# Patient Record
Sex: Male | Born: 1945 | Race: White | Hispanic: No | Marital: Married | Smoking: Former smoker
Health system: Southern US, Community
[De-identification: ages and names within clinical notes are randomized; demographics above are authoritative.]

## PROBLEM LIST (undated history)

## (undated) DIAGNOSIS — Z8679 Personal history of other diseases of the circulatory system: Secondary | ICD-10-CM

## (undated) DIAGNOSIS — I639 Cerebral infarction, unspecified: Secondary | ICD-10-CM

## (undated) DIAGNOSIS — Z87898 Personal history of other specified conditions: Secondary | ICD-10-CM

## (undated) DIAGNOSIS — I4891 Unspecified atrial fibrillation: Secondary | ICD-10-CM

## (undated) HISTORY — DX: Personal history of other specified conditions: Z87.898

## (undated) HISTORY — DX: Personal history of other diseases of the circulatory system: Z86.79

## (undated) HISTORY — DX: Cerebral infarction, unspecified: I63.9

---

## 1997-09-16 ENCOUNTER — Emergency Department (HOSPITAL_COMMUNITY): Admission: EM | Admit: 1997-09-16 | Discharge: 1997-09-16 | Payer: Self-pay | Admitting: Emergency Medicine

## 2006-09-20 ENCOUNTER — Emergency Department (HOSPITAL_COMMUNITY): Admission: EM | Admit: 2006-09-20 | Discharge: 2006-09-20 | Payer: Self-pay | Admitting: Emergency Medicine

## 2006-09-27 ENCOUNTER — Emergency Department (HOSPITAL_COMMUNITY): Admission: EM | Admit: 2006-09-27 | Discharge: 2006-09-27 | Payer: Self-pay | Admitting: Emergency Medicine

## 2006-10-03 ENCOUNTER — Emergency Department (HOSPITAL_COMMUNITY): Admission: EM | Admit: 2006-10-03 | Discharge: 2006-10-03 | Payer: Self-pay | Admitting: Emergency Medicine

## 2010-01-10 ENCOUNTER — Observation Stay (HOSPITAL_COMMUNITY)
Admission: EM | Admit: 2010-01-10 | Discharge: 2010-01-11 | Payer: Self-pay | Source: Home / Self Care | Admitting: Emergency Medicine

## 2010-01-11 ENCOUNTER — Ambulatory Visit: Payer: Self-pay | Admitting: Gastroenterology

## 2010-01-13 ENCOUNTER — Encounter: Payer: Self-pay | Admitting: Gastroenterology

## 2010-01-13 ENCOUNTER — Telehealth (INDEPENDENT_AMBULATORY_CARE_PROVIDER_SITE_OTHER): Payer: Self-pay

## 2010-01-20 ENCOUNTER — Telehealth (INDEPENDENT_AMBULATORY_CARE_PROVIDER_SITE_OTHER): Payer: Self-pay

## 2010-01-20 ENCOUNTER — Encounter: Payer: Self-pay | Admitting: Gastroenterology

## 2010-01-29 ENCOUNTER — Encounter: Payer: Self-pay | Admitting: Gastroenterology

## 2010-02-23 ENCOUNTER — Encounter (INDEPENDENT_AMBULATORY_CARE_PROVIDER_SITE_OTHER): Payer: Self-pay | Admitting: *Deleted

## 2010-06-09 NOTE — Progress Notes (Signed)
Summary: Schedule repeat EGD Dil  Phone Note Outgoing Call Call back at [facility] Phone [phone]   Call placed by: Darcey Nora RN, CGRN,  January 20, 2010 8:55 AM Call placed to: Patient Summary of Call: I called and spoke with the patient, according to Dr Gretta Began office he is low risk and ok for ESA.  Patient  wants to wait until his Medicare takes effect on 02/07/10, and needs a Monday.  Patient  is scheduled for 03/02/10 3:00, pre-visit is scheduled for 02/23/10 9:00.  Initial call taken by: Darcey Nora RN, CGRN,  January 20, 2010 8:56 AM     Appended Document: Schedule repeat EGD Dil I agree with above plan.

## 2010-06-09 NOTE — Letter (Signed)
Summary: [facility]  [facility]   Imported By: Lester Solis 02/10/2010 09:32:13  _____________________________________________________________________  External Attachment:    Type:   Image     Comment:   External Document

## 2010-06-09 NOTE — Letter (Signed)
Summary: Pre Visit No Show Letter  [facility]  [address] [place], [place] [address]   Phone: [phone]  Fax: [phone]        February 23, 2010 MRN: [id number]    COLBE VIVIANO [address] [place], [place]  [address]    Dear Mr. Sher,   We have been unable to reach you by phone concerning the pre-procedure visit that you missed on Monday Oct. 17th, 2011. For this reason,your procedure scheduled on Monday Oct. 24th, 2011 @ 3 pm has been cancelled. Our scheduling staff will gladly assist you with rescheduling your appointments at a more convenient time. Please call our office at [phone] between the hours of 8:00am and 5:00pm, press option #2 to reach an appointment scheduler. Please consider updating your contact numbers at this time so that we can reach you by phone in the future with schedule changes or results.    Thank you,    Doristine Church RN II [facility]

## 2010-06-09 NOTE — Progress Notes (Signed)
Summary: Schedule repeat EGD  Phone Note Outgoing Call Call back at Home Phone    Call placed by: Darcey Nora RN, CGRN,  January 13, 2010 12:00 PM Call placed to: Patient Summary of Call: I have spoke with the patient's sone.  He is aware we are waiting to hear back from Dr Garen Lah about clearance before setting up repeat EGD. They are aware, I will be calling back once I hear for cardiology Initial call taken by: Darcey Nora RN, CGRN,  January 13, 2010 12:02 PM

## 2010-06-09 NOTE — Letter (Signed)
Summary: Anticoagulation Modification Letter  [facility]  [address] [place], [place] [address]   Phone: [phone]  Fax: [phone]    January 13, 2010  Re:    Phillip Solis DOB:    1945/07/30 MRN:    [id number]    Dear  Dr Ranell Patrick,   Dr Lamont Snowball has determined the above patient needs a repeat EGD with dilation in 2-4 weeks to prevent any further food impactions.  He was recently hospitalized and treated by you during the admission for atrial fibrillation.   Please advise if ok to proceed with the procedure in the next 2-4 weeks.     Please fax you recommendations back to Darcey Nora, RN.  Thank you for your help with this matter.  Sincerely,  Darcey Nora RN, [facility]   Physician Recommendation:  ____________________________________________________________________________________________________________________________________________.

## 2010-06-09 NOTE — Letter (Signed)
Summary: Previsit letter  [facility]  [address] [place], [place] [address]   Phone: [phone]  Fax: [phone]       01/20/2010 MRN: [id number]  Erric Grantham [address] [place], [place]  [address]  [place]  Dear Mr. Stoneham,  Welcome to the Gastroenterology Division at [facility].    You are scheduled to see a nurse for your pre-procedure visit on 02/23/10  at 9:00 a.m. on the 3rd floor at [facility], [address]  We ask that you try to arrive at our office 15 minutes prior to your appointment time to allow for check-in.  Your nurse visit will consist of discussing your medical and surgical history, your immediate family medical history, and your medications.    Please bring a complete list of all your medications or, if you prefer, bring the medication bottles and we will list them.  We will need to be aware of both prescribed and over the counter drugs.  We will need to know exact dosage information as well.  If you are on blood thinners (Coumadin, Plavix, Aggrenox, Ticlid, etc.) please call our office today/prior to your appointment, as we need to consult with your physician about holding your medication.   Please be prepared to read and sign documents such as consent forms, a financial agreement, and acknowledgement forms.  If necessary, and with your consent, a friend or relative is welcome to sit-in on the nurse visit with you.  Please bring your insurance card so that we may make a copy of it.  If your insurance requires a referral to see a specialist, please bring your referral form from your primary care physician.  No co-pay is required for this nurse visit.     If you cannot keep your appointment, please call [phone] to cancel or reschedule prior to your appointment date.  This allows [place] the opportunity to schedule an appointment for another patient in need of care.    Thank you for choosing [facility] for your medical  needs.  We appreciate the opportunity to care for you.  Please visit [place] at our website  to learn more about our practice.                     Sincerely.                                                                                                                   The Gastroenterology Division

## 2010-06-09 NOTE — Procedures (Signed)
Summary: Upper Endoscopy  Patient: Phillip Solis Note: All result statuses are Final unless otherwise noted.  Tests: (1) Upper Endoscopy (EGD)   EGD Upper Endoscopy       DONE     [facility]     [address]     [place], [place]  [address]           ENDOSCOPY PROCEDURE REPORT     PATIENT:  [name], [name]  MR#:  [id number]     BIRTHDATE:  [birth year], 63 yrs. old  GENDER:  male     ENDOSCOPIST:  Judie Petit T. Russella Dar, MD, [facility]     Referred by:  Eber Hong, PHD     PROCEDURE DATE:  01/11/2010     PROCEDURE:  EGD with foreign body removal     ASA CLASS:  Class II     INDICATIONS:  food impaction, dysphagia     MEDICATIONS:   Fentanyl 55 mcg IV, Versed 6 mg IV     TOPICAL ANESTHETIC:  Cetacaine Spray     DESCRIPTION OF PROCEDURE:   After the risks benefits and     alternatives of the procedure were thoroughly explained, informed     consent was obtained.  The Pentax Gastroscope [id number] endoscope     was introduced through the mouth and advanced to the second     portion of the duodenum, limited by Retained food in the stomach.     The instrument was slowly withdrawn as the mucosa was fully     examined.     <<PROCEDUREIMAGES>>     Retained food was present in the distal esophagus which was     revomed with a tripod grasper. Retained food was present in the     body of the stomach.  A stricture was found at the     gastroesophageal junction. It was cricumferential and benign     appearing. It was 14 mm in diameter. Esophagitis was found in the     distal esophagus. It was erythematous and friable. The stomach was     entered and closely examined. The pylorus, antrum, angularis, and     lesser curvature were well visualized, including a retroflexed     view of the cardia and fundus. The stomach wall was normally     distensable. The scope passed easily through the pylorus into the     duodenum. The duodenal bulb was normal in appearance, as was the     postbulbar  duodenum. Retroflexed views revealed no abnormalities.     The scope was then withdrawn from the patient and the procedure     completed.     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 Russella Dar, MD, [facility]  n.     eSIGNED:   Kathryn Linarez T. Treavon Castilleja at 01/11/2010 01:31 AM           Jeraldine Loots, [id number]  Note: An exclamation mark (!) indicates a result that was not dispersed into the flowsheet. Document Creation Date: 01/13/2010 9:44 AM _______________________________________________________________________  (1) Order result status: Final Collection or observation date-time: 01/11/2010 01:11 Requested date-time:  Receipt date-time:  Reported date-time:  Referring Physician:   Ordering Physician: Claudette Head [phone]) Specimen Source:  Source: Launa Grill Order Number: [id number] Lab site:

## 2010-07-23 LAB — POCT CARDIAC MARKERS
CKMB, poc: <1 ng/mL — ABNORMAL LOW (ref 1.0–8.0)
Myoglobin, poc: 47.8 ng/mL (ref 12–200)

## 2010-07-23 LAB — CARDIAC PANEL(CRET KIN+CKTOT+MB+TROPI)
CK, MB: 1.9 ng/mL (ref 0.3–4.0)
Relative Index: INVALID (ref 0.0–2.5)
Total CK: 45 U/L (ref 7–232)
Total CK: 74 U/L (ref 7–232)
Troponin I: 0.02 ng/mL (ref 0.00–0.06)

## 2010-07-23 LAB — DIFFERENTIAL
Lymphocytes Relative: 16 % (ref 12–46)
Lymphs Abs: 1.4 10*3/uL (ref 0.7–4.0)
Monocytes Relative: 5 % (ref 3–12)
Neutro Abs: 6.4 10*3/uL (ref 1.7–7.7)
Neutrophils Relative %: 76 % (ref 43–77)

## 2010-07-23 LAB — BASIC METABOLIC PANEL
Calcium: 8.1 mg/dL — ABNORMAL LOW (ref 8.4–10.5)
GFR calc non Af Amer: >60 mL/min (ref >60)
Glucose, Bld: 171 mg/dL — ABNORMAL HIGH (ref 70–99)
Sodium: 137 mEq/L (ref 135–145)

## 2010-07-23 LAB — POCT I-STAT, CHEM 8
BUN: 14 mg/dL (ref 6–23)
Creatinine, Ser: 1 mg/dL (ref 0.4–1.5)
Hemoglobin: 13.9 g/dL (ref 13.0–17.0)
Potassium: 4.7 mEq/L (ref 3.5–5.1)
Sodium: 141 mEq/L (ref 135–145)

## 2010-07-23 LAB — MAGNESIUM: Magnesium: 2.2 mg/dL (ref 1.5–2.5)

## 2010-07-23 LAB — CBC
Hemoglobin: 13.2 g/dL (ref 13.0–17.0)
RBC: 4.21 MIL/uL — ABNORMAL LOW (ref 4.22–5.81)

## 2010-07-23 LAB — LIPID PANEL
Triglycerides: 62 mg/dL (ref <150)
VLDL: 12 mg/dL (ref 0–40)

## 2010-07-23 LAB — CK TOTAL AND CKMB (NOT AT ARMC)
Relative Index: INVALID (ref 0.0–2.5)
Total CK: 60 U/L (ref 7–232)

## 2013-03-14 ENCOUNTER — Encounter: Payer: Self-pay | Admitting: *Deleted

## 2013-03-19 ENCOUNTER — Encounter: Payer: Self-pay | Admitting: Internal Medicine

## 2013-03-19 ENCOUNTER — Ambulatory Visit (INDEPENDENT_AMBULATORY_CARE_PROVIDER_SITE_OTHER): Payer: Medicare Other | Admitting: Internal Medicine

## 2013-03-19 VITALS — BP 118/80 | HR 57 | Ht 66.0 in | Wt 154.5 lb

## 2013-03-19 DIAGNOSIS — I4891 Unspecified atrial fibrillation: Secondary | ICD-10-CM

## 2013-03-19 DIAGNOSIS — R0609 Other forms of dyspnea: Secondary | ICD-10-CM

## 2013-03-19 DIAGNOSIS — F17201 Nicotine dependence, unspecified, in remission: Secondary | ICD-10-CM | POA: Insufficient documentation

## 2013-03-19 DIAGNOSIS — F17200 Nicotine dependence, unspecified, uncomplicated: Secondary | ICD-10-CM

## 2013-03-19 DIAGNOSIS — Z87891 Personal history of nicotine dependence: Secondary | ICD-10-CM

## 2013-03-19 DIAGNOSIS — Z79899 Other long term (current) drug therapy: Secondary | ICD-10-CM

## 2013-03-19 DIAGNOSIS — R0602 Shortness of breath: Secondary | ICD-10-CM

## 2013-03-19 DIAGNOSIS — Z720 Tobacco use: Secondary | ICD-10-CM

## 2013-03-19 DIAGNOSIS — I739 Peripheral vascular disease, unspecified: Secondary | ICD-10-CM

## 2013-03-19 DIAGNOSIS — R3911 Hesitancy of micturition: Secondary | ICD-10-CM

## 2013-03-19 DIAGNOSIS — Z136 Encounter for screening for cardiovascular disorders: Secondary | ICD-10-CM

## 2013-03-19 DIAGNOSIS — E785 Hyperlipidemia, unspecified: Secondary | ICD-10-CM

## 2013-03-19 DIAGNOSIS — R5381 Other malaise: Secondary | ICD-10-CM

## 2013-03-19 DIAGNOSIS — R5383 Other fatigue: Secondary | ICD-10-CM

## 2013-03-19 DIAGNOSIS — I480 Paroxysmal atrial fibrillation: Secondary | ICD-10-CM | POA: Insufficient documentation

## 2013-03-19 MED ORDER — BISOPROLOL FUMARATE 5 MG PO TABS: 5.0000 mg | ORAL_TABLET | Freq: Two times a day (BID) | ORAL | Status: DC

## 2013-03-19 NOTE — Progress Notes (Signed)
OFFICE NOTE  Chief Complaint:  Routine follow-up, dyspnea with exertion, pain in his calves when he walks  Primary Care Physician: No PCP Per Patient  HPI:  Phillip Solis is a 67 year old gentleman who I first saw in 2011 with new-onset atrial fibrillation in the setting of vomiting and during an endoscopy to remove food which was stuck in his throat. It resolved spontaneously and he has had no recurrence that I am aware of. He occasionally gets palpitations; however, is maintained on bisoprolol 5 mg twice a day. He also takes aspirin due to a low CHADS2 score, which is probably 0. Unfortunately he does not have a primary care provider. Today's also described increasing shortness of breath over the past several months which is worse he notes when walking long distances, especially across the parking lot at [employer]. He says that when he arrives from his car he is markedly short of breath and notes that he's having pain in his legs that usually discontinues his walk. As he stops the symptoms improved. The symptoms have been going on again for several months but increasing somewhat in frequency. Mr. Lanigan has a strong smoking history with one and half pack per day smoking for over 30 years but he quit a number of years ago.  He has never had an ischemic evaluation to my knowledge. His EKG today does show a interventricular conduction delay and sinus bradycardia with nonspecific T-wave changes.  PMHx:  Past Medical History  Diagnosis Date  . History of atrial fibrillation     in setting of endoscopy in 2011; resolved  . History of palpitations     History reviewed. No pertinent past surgical history.  FAMHx:  Family History  Problem Relation Age of Onset  . Heart Problems Mother   . Cancer Father     SOCHx:   reports that he quit smoking about 40 years ago. His smokeless tobacco use includes Chew. He reports that he drinks alcohol. He reports that he does not use illicit  drugs.  ALLERGIES:  No Known Allergies  ROS: A comprehensive review of systems was negative except for: Constitutional: positive for fatigue Respiratory: positive for dyspnea on exertion Cardiovascular: positive for dyspnea and claudication  HOME MEDS: Current Outpatient Prescriptions  Medication Sig Dispense Refill  . aspirin 325 MG tablet Take 325 mg by mouth daily.      . bisoprolol (ZEBETA) 5 MG tablet Take 1 tablet (5 mg total) by mouth 2 (two) times daily.  60 tablet  6  . Multiple Vitamin (MULTIVITAMIN) capsule Take 1 capsule by mouth daily.       No current facility-administered medications for this visit.    LABS/IMAGING: No results found for this or any previous visit (from the past 48 hour(s)). No results found.  VITALS: BP 118/80  Pulse 57  Ht 5\' 6"  (1.676 m)  Wt 154 lb 8 oz (70.081 kg)  BMI 24.95 kg/m2  EXAM: General appearance: alert and no distress Neck: no carotid bruit and no JVD Lungs: diminished breath sounds bilaterally Heart: regular rate and rhythm, S1, S2 normal, no murmur, click, rub or gallop Abdomen: soft, non-tender; bowel sounds normal; no masses,  no organomegaly and no pulsatile masses or bruits Extremities: extremities normal, atraumatic, no cyanosis or edema Pulses: 1+ pulses Skin: pale, warm, dry Neurologic: Grossly normal Psych: Mood, affect normal  EKG: Sinus bradycardia at 57, nonspecific T wave changes  ASSESSMENT: 1. Progressive dyspnea on exertion 2. Claudication 3. Paroxysmal atrial  fibrillation on aspirin 4. Extensive former tobacco smoker  PLAN: 1.   Mr. Sondgeroth has numerous risk factors for cardiovascular disease and peripheral arterial disease. He did have a 40-50 pack year history of smoking. He is describing symptoms of pain in his leg when walking which is fairly predictable and concerning for claudication. I would recommend lower extremity arterial Dopplers and a one time abdominal ultrasound to screen for abdominal  aortic aneurysm, as recommended by the AHA and USPHTF for males with the age is between 79 and 16. In addition he should undergo a lexiscan nuclear stress test, given the fact that he cannot walk without leg pain, to evaluate for coronary ischemia.  Finally, there is a reasonable chance that he has COPD as a possible cause of his shortness of breath. I would also recommend PFTs. He's not had any lab work and would benefit from screening labs to look for cholesterol, metabolic profile, CBC, PSA and thyroid screen.  Plan is to see him back in several weeks to review the results of these studies. I have also encouraged him to obtain a primary care provider in [place].  Chrystie Nose, MD, [facility] Attending Cardiologist [facility]  HILTY,Kenneth C 1Oct 12, 202014, 6:01 PM

## 2013-03-19 NOTE — Patient Instructions (Addendum)
Dr Rennis Golden has requested that you have a lexiscan myoview. For further information please visit [email or web address] Please follow instruction sheet, as given.  Your physician has requested that you have an abdominal aorta duplex. During this test, an ultrasound is used to evaluate the aorta. Allow 30 minutes for this exam. Do not eat after midnight the day before and avoid carbonated beverages  Your physician has requested that you have a lower extremity arterial duplex. This test is an ultrasound of the arteries in the legs. It looks at arterial blood flow in the legs . Allow one hour for Lower Arterial scans. There are restrictions or special instructions.  Your physician has recommended that you have a pulmonary function test. Pulmonary Function Tests are a group of tests that measure how well air moves in and out of your lungs.  Your physician recommends that you have lab work done.  Please get established with a primary care physician.  Dr. Rennis Golden wants you to follow-up in 3 weeks.

## 2013-03-30 ENCOUNTER — Encounter (HOSPITAL_COMMUNITY): Payer: Medicare Other

## 2013-04-13 ENCOUNTER — Ambulatory Visit (HOSPITAL_COMMUNITY): Payer: Medicare Other

## 2013-04-13 ENCOUNTER — Encounter (HOSPITAL_COMMUNITY): Payer: Medicare Other

## 2013-04-16 ENCOUNTER — Ambulatory Visit: Payer: Medicare Other | Admitting: Internal Medicine

## 2013-04-25 ENCOUNTER — Telehealth (HOSPITAL_COMMUNITY): Payer: Self-pay | Admitting: *Deleted

## 2013-05-22 ENCOUNTER — Telehealth (HOSPITAL_COMMUNITY): Payer: Self-pay | Admitting: *Deleted

## 2014-04-16 ENCOUNTER — Other Ambulatory Visit: Payer: Self-pay | Admitting: Internal Medicine

## 2014-04-16 NOTE — Telephone Encounter (Signed)
Rx was sent to pharmacy electronically. 

## 2014-06-04 ENCOUNTER — Other Ambulatory Visit: Payer: Self-pay | Admitting: *Deleted

## 2014-06-04 MED ORDER — BISOPROLOL FUMARATE 5 MG PO TABS: 5.0000 mg | ORAL_TABLET | Freq: Two times a day (BID) | ORAL | Status: DC

## 2014-06-14 ENCOUNTER — Encounter: Payer: Self-pay | Admitting: Internal Medicine

## 2014-06-14 ENCOUNTER — Ambulatory Visit (INDEPENDENT_AMBULATORY_CARE_PROVIDER_SITE_OTHER): Payer: Medicare HMO | Admitting: Internal Medicine

## 2014-06-14 VITALS — BP 110/82 | HR 63 | Ht 67.5 in | Wt 151.5 lb

## 2014-06-14 DIAGNOSIS — R079 Chest pain, unspecified: Secondary | ICD-10-CM | POA: Diagnosis not present

## 2014-06-14 DIAGNOSIS — R0609 Other forms of dyspnea: Secondary | ICD-10-CM | POA: Diagnosis not present

## 2014-06-14 DIAGNOSIS — R799 Abnormal finding of blood chemistry, unspecified: Secondary | ICD-10-CM

## 2014-06-14 DIAGNOSIS — I480 Paroxysmal atrial fibrillation: Secondary | ICD-10-CM

## 2014-06-14 DIAGNOSIS — R209 Unspecified disturbances of skin sensation: Secondary | ICD-10-CM

## 2014-06-14 DIAGNOSIS — Z87891 Personal history of nicotine dependence: Secondary | ICD-10-CM

## 2014-06-14 DIAGNOSIS — R9431 Abnormal electrocardiogram [ECG] [EKG]: Secondary | ICD-10-CM

## 2014-06-14 DIAGNOSIS — F17201 Nicotine dependence, unspecified, in remission: Secondary | ICD-10-CM

## 2014-06-14 DIAGNOSIS — R6889 Other general symptoms and signs: Secondary | ICD-10-CM

## 2014-06-14 DIAGNOSIS — R7989 Other specified abnormal findings of blood chemistry: Secondary | ICD-10-CM

## 2014-06-14 DIAGNOSIS — I739 Peripheral vascular disease, unspecified: Secondary | ICD-10-CM

## 2014-06-14 DIAGNOSIS — R0989 Other specified symptoms and signs involving the circulatory and respiratory systems: Secondary | ICD-10-CM

## 2014-06-14 DIAGNOSIS — R0602 Shortness of breath: Secondary | ICD-10-CM

## 2014-06-14 MED ORDER — BISOPROLOL FUMARATE 5 MG PO TABS: 5.0000 mg | ORAL_TABLET | Freq: Two times a day (BID) | ORAL | Status: DC

## 2014-06-14 NOTE — Patient Instructions (Addendum)
Your physician has requested that you have an abdominal aorta duplex. During this test, an ultrasound is used to evaluate the aorta. Allow 30 minutes for this exam. Do not eat after midnight the day before and avoid carbonated beverages  Your physician has requested that you have a lower extremity arterial duplex. This test is an ultrasound of the arteries in the legs. It looks at arterial blood flow in the legs. Allow one hour for Lower Arterial scans. There are no restrictions or special instructions  Your physician has requested that you have an echocardiogram. Echocardiography is a painless test that uses sound waves to create images of your heart. It provides your doctor with information about the size and shape of your heart and how well your heart's chambers and valves are working. This procedure takes approximately one hour. There are no restrictions for this procedure.  Your physician recommends that you schedule a follow-up appointment after your tests.

## 2014-06-14 NOTE — Progress Notes (Signed)
OFFICE NOTE  Chief Complaint:  Recently in [facility] with sinus infection, told he had "fluid around his heart".  Primary Care Physician: No PCP Per Patient  HPI:  Phillip Solis is a 69 year old gentleman who I first saw in 2011 with new-onset atrial fibrillation in the setting of vomiting and during an endoscopy to remove food which was stuck in his throat. It resolved spontaneously and he has had no recurrence that I am aware of. He occasionally gets palpitations; however, is maintained on bisoprolol 5 mg twice a day. He also takes aspirin due to a low CHADS2 score, which is probably 0. Unfortunately he does not have a primary care provider. Today's also described increasing shortness of breath over the past several months which is worse he notes when walking long distances, especially across the parking lot at [employer]. He says that when he arrives from his car he is markedly short of breath and notes that he's having pain in his legs that usually discontinues his walk. As he stops the symptoms improved. The symptoms have been going on again for several months but increasing somewhat in frequency. Phillip Solis has a strong smoking history with one and half pack per day smoking for over 30 years but he quit a number of years ago.  He has never had an ischemic evaluation to my knowledge. His EKG today does show a interventricular conduction delay and sinus bradycardia with nonspecific T-wave changes.  I saw Phillip Solis back in the office today nearly a year and a half after his last appointment. He had a number of medical problems which I felt needed further evaluation. I had ordered a stress test, abdominal ultrasound and peripheral Dopplers, but he had none of those tests performed. Apparently he lost his insurance and stopped working. He is currently on Medicare with a [employer] supplement. He recently was in [facility] for what he thought was a sinus infection. From the best I can tell  he had imaging including a chest x-ray and a head CT. He was placed on antibiotics however he was told that he had "fluid around his heart". Laboratory work indicated an elevated BNP of 1400. There is no evidence that he was diuresed and an echocardiogram was not performed. Chest x-ray demonstrated hyperinflation with mild linear atelectasis at the lung bases. He was discharged from the emergency department.  With regard to his claudication he reports she's been walking a lot more and he reports his leg pain has improved somewhat.  PMHx:  Past Medical History  Diagnosis Date  . History of atrial fibrillation     in setting of endoscopy in 2011; resolved  . History of palpitations     No past surgical history on file.  FAMHx:  Family History  Problem Relation Age of Onset  . Heart Problems Mother   . Cancer Father     SOCHx:   reports that he quit smoking about 41 years ago. His smokeless tobacco use includes Chew. He reports that he drinks alcohol. He reports that he does not use illicit drugs.  ALLERGIES:  No Known Allergies  ROS: A comprehensive review of systems was negative except for: Constitutional: positive for fatigue Respiratory: positive for dyspnea on exertion Cardiovascular: positive for dyspnea and claudication  HOME MEDS: Current Outpatient Prescriptions  Medication Sig Dispense Refill  . amoxicillin (AMOXIL) 500 MG capsule Take 500 mg by mouth 3 (three) times daily. As Directed    . aspirin 325 MG  tablet Take 325 mg by mouth daily.    . bisoprolol (ZEBETA) 5 MG tablet Take 1 tablet (5 mg total) by mouth 2 (two) times daily. 60 tablet 6  . Multiple Vitamin (MULTIVITAMIN) capsule Take 1 capsule by mouth daily.     No current facility-administered medications for this visit.    LABS/IMAGING: No results found for this or any previous visit (from the past 48 hour(s)). No results found.  VITALS: BP 110/82 mmHg  Pulse 63  Ht 5' 7.5" (1.715 m)  Wt 151 lb 8 oz  (68.72 kg)  BMI 23.36 kg/m2  EXAM: General appearance: alert and no distress Neck: no carotid bruit and no JVD Lungs: diminished breath sounds bilaterally Heart: regular rate and rhythm, S1, S2 normal, no murmur, click, rub or gallop Abdomen: soft, non-tender; bowel sounds normal; no masses,  no organomegaly and no pulsatile masses or bruits Extremities: extremities normal, atraumatic, no cyanosis or edema Pulses: 1+ pulses Skin: pale, warm, dry Neurologic: Grossly normal Psych: Mood, affect normal  EKG: Sinus bradycardia at 57, nonspecific T wave changes  ASSESSMENT: 1. Recent hospital visit with elevated BNP and questionable "fluid around his heart" 2. Progressive dyspnea on exertion 3. Claudication - improved 4. Paroxysmal atrial fibrillation on aspirin 5. Extensive former tobacco smoker  PLAN: 1.   Phillip Solis has numerous risk factors for cardiovascular disease and peripheral arterial disease. He did have a 40-50 pack year history of smoking. He is describing symptoms of pain in his leg when walking which is fairly predictable and concerning for claudication. This does seem to have improved some with exercise. I would again recommend lower extremity arterial Dopplers and a one time abdominal ultrasound to screen for abdominal aortic aneurysm, as recommended by the AHA and USPHTF for males with the age is between 87 and 54. I previously mentioned doing a nuclear stress test however given the new information about possible heart failure and elevated BNP, I would prefer to do an echocardiogram first. If there is some evidence of cardiomyopathy he may need cardiac catheterization.  Plan to see him back to discuss results of these tests in a few weeks.  [name], MD, [facility] Attending Cardiologist [facility]  Janaiah Vetrano C 06/14/2014, 5:30 PM

## 2014-06-18 ENCOUNTER — Other Ambulatory Visit: Payer: Self-pay

## 2014-06-18 MED ORDER — BISOPROLOL FUMARATE 5 MG PO TABS: 5.0000 mg | ORAL_TABLET | Freq: Two times a day (BID) | ORAL | Status: DC

## 2014-06-18 NOTE — Telephone Encounter (Signed)
Rx(s) sent to pharmacy electronically.  

## 2014-06-21 ENCOUNTER — Ambulatory Visit (HOSPITAL_BASED_OUTPATIENT_CLINIC_OR_DEPARTMENT_OTHER)
Admission: RE | Admit: 2014-06-21 | Discharge: 2014-06-21 | Disposition: A | Discharge status: Home / Self Care | Payer: Medicare HMO | Source: Ambulatory Visit | Attending: Internal Medicine | Admitting: Internal Medicine

## 2014-06-21 ENCOUNTER — Ambulatory Visit (HOSPITAL_BASED_OUTPATIENT_CLINIC_OR_DEPARTMENT_OTHER)
Admission: RE | Admit: 2014-06-21 | Discharge: 2014-06-21 | Disposition: A | Discharge status: Home / Self Care | Payer: Medicare HMO | Source: Ambulatory Visit | Attending: Cardiovascular Disease | Admitting: Cardiovascular Disease

## 2014-06-21 ENCOUNTER — Ambulatory Visit (HOSPITAL_COMMUNITY)
Admission: RE | Admit: 2014-06-21 | Discharge: 2014-06-21 | Disposition: A | Discharge status: Home / Self Care | Payer: Medicare HMO | Source: Ambulatory Visit | Attending: Cardiovascular Disease | Admitting: Cardiovascular Disease

## 2014-06-21 DIAGNOSIS — I739 Peripheral vascular disease, unspecified: Secondary | ICD-10-CM | POA: Diagnosis not present

## 2014-06-21 DIAGNOSIS — R7989 Other specified abnormal findings of blood chemistry: Secondary | ICD-10-CM

## 2014-06-21 DIAGNOSIS — Z8249 Family history of ischemic heart disease and other diseases of the circulatory system: Secondary | ICD-10-CM | POA: Diagnosis not present

## 2014-06-21 DIAGNOSIS — R0600 Dyspnea, unspecified: Secondary | ICD-10-CM

## 2014-06-21 DIAGNOSIS — R6889 Other general symptoms and signs: Secondary | ICD-10-CM

## 2014-06-21 DIAGNOSIS — I714 Abdominal aortic aneurysm, without rupture, unspecified: Secondary | ICD-10-CM

## 2014-06-21 DIAGNOSIS — R799 Abnormal finding of blood chemistry, unspecified: Secondary | ICD-10-CM | POA: Insufficient documentation

## 2014-06-21 DIAGNOSIS — I340 Nonrheumatic mitral (valve) insufficiency: Secondary | ICD-10-CM | POA: Diagnosis not present

## 2014-06-21 DIAGNOSIS — I4891 Unspecified atrial fibrillation: Secondary | ICD-10-CM | POA: Insufficient documentation

## 2014-06-21 DIAGNOSIS — I10 Essential (primary) hypertension: Secondary | ICD-10-CM | POA: Insufficient documentation

## 2014-06-21 DIAGNOSIS — R209 Unspecified disturbances of skin sensation: Secondary | ICD-10-CM

## 2014-06-21 DIAGNOSIS — R0602 Shortness of breath: Secondary | ICD-10-CM | POA: Diagnosis present

## 2014-06-21 DIAGNOSIS — R0989 Other specified symptoms and signs involving the circulatory and respiratory systems: Secondary | ICD-10-CM

## 2014-06-21 DIAGNOSIS — Z87891 Personal history of nicotine dependence: Secondary | ICD-10-CM | POA: Diagnosis not present

## 2014-06-21 DIAGNOSIS — R0609 Other forms of dyspnea: Secondary | ICD-10-CM

## 2014-06-21 NOTE — Progress Notes (Signed)
Abdominal Aortic Duplex Completed °Brianna L Mazza,RVT °

## 2014-06-21 NOTE — Progress Notes (Signed)
2D Echocardiogram Complete.  06/21/2014   Deliah Boston, RDCS   Preliminary Technician Findings:  The Ejection Fraction is midly-moderately reduced (30-35%).  There is also Moderate-Severe Mitral Regurgitation.  Phillip Solis does not complain of any symptoms at the time of exam, and has a follow-up appointment to see Dr. Debara Pickett on 07-12-2014.

## 2014-06-21 NOTE — Progress Notes (Signed)
Lower Extremity Arterial Duplex Completed. °Brianna L Mazza,RVT °

## 2014-06-25 ENCOUNTER — Encounter (HOSPITAL_COMMUNITY): Payer: Medicare Other

## 2014-06-25 ENCOUNTER — Telehealth: Payer: Self-pay | Admitting: Physician Assistant

## 2014-06-25 NOTE — Telephone Encounter (Signed)
Ms. Hewitt called because Mr. Donaway had been complaining of numbness in his left arm and she was concerned.  Upon talking to Ms. Ribera, the numbness had improved. She had given him some aspirin and one of his heart pills. He is currently asymptomatic. She thought he might have late on it wrong but couldn't be sure. She thought he was doing okay now.  Advised her that if he had recurrent symptoms, she should call back or take him to the emergency room.  Rosaria Ferries, PA-C 06/25/2014 7:03 PM Beeper [phone]

## 2014-07-12 ENCOUNTER — Ambulatory Visit (INDEPENDENT_AMBULATORY_CARE_PROVIDER_SITE_OTHER): Payer: Medicare HMO | Admitting: Internal Medicine

## 2014-07-12 ENCOUNTER — Encounter: Payer: Self-pay | Admitting: Internal Medicine

## 2014-07-12 VITALS — BP 131/80 | HR 85 | Ht 67.0 in | Wt 157.1 lb

## 2014-07-12 DIAGNOSIS — R0609 Other forms of dyspnea: Secondary | ICD-10-CM

## 2014-07-12 DIAGNOSIS — Z79899 Other long term (current) drug therapy: Secondary | ICD-10-CM

## 2014-07-12 DIAGNOSIS — D689 Coagulation defect, unspecified: Secondary | ICD-10-CM | POA: Diagnosis not present

## 2014-07-12 DIAGNOSIS — R5383 Other fatigue: Secondary | ICD-10-CM

## 2014-07-12 DIAGNOSIS — I429 Cardiomyopathy, unspecified: Secondary | ICD-10-CM | POA: Diagnosis not present

## 2014-07-12 DIAGNOSIS — I480 Paroxysmal atrial fibrillation: Secondary | ICD-10-CM

## 2014-07-12 DIAGNOSIS — I420 Dilated cardiomyopathy: Secondary | ICD-10-CM | POA: Insufficient documentation

## 2014-07-12 NOTE — Patient Instructions (Signed)
Your physician has requested that you have a right and left cardiac catheterization (March 17th? with Dr. Debara Pickett). Cardiac catheterization is used to diagnose and/or treat various heart conditions. Doctors may recommend this procedure for a number of different reasons. The most common reason is to evaluate chest pain. Chest pain can be a symptom of coronary artery disease (CAD), and cardiac catheterization can show whether plaque is narrowing or blocking your heart's arteries. This procedure is also used to evaluate the valves, as well as measure the blood flow and oxygen levels in different parts of your heart. For further information please visit [email or web address]. Please follow instruction sheet, as given.  You will need to have blood work 3-5 days prior to this procedure.  Please go to [address] do not need an appointment

## 2014-07-12 NOTE — Progress Notes (Signed)
OFFICE NOTE  Chief Complaint:  Follow-up tests, improved breathing  Primary Care Physician: Teressa Lower, MD  HPI:  Phillip Solis is a 69 year old gentleman who I first saw in 2011 with new-onset atrial fibrillation in the setting of vomiting and during an endoscopy to remove food which was stuck in his throat. It resolved spontaneously and he has had no recurrence that I am aware of. He occasionally gets palpitations; however, is maintained on bisoprolol 5 mg twice a day. He also takes aspirin due to a low CHADS2 score, which is probably 0. Unfortunately he does not have a primary care provider. Today's also described increasing shortness of breath over the past several months which is worse he notes when walking long distances, especially across the parking lot at [employer]. He says that when he arrives from his car he is markedly short of breath and notes that he's having pain in his legs that usually discontinues his walk. As he stops the symptoms improved. The symptoms have been going on again for several months but increasing somewhat in frequency. [name] has a strong smoking history with one and half pack per day smoking for over 30 years but he quit a number of years ago.  He has never had an ischemic evaluation to my knowledge. His EKG today does show a interventricular conduction delay and sinus bradycardia with nonspecific T-wave changes.  I saw Phillip Solis back in the office today nearly a year and a half after his last appointment. He had a number of medical problems which I felt needed further evaluation. I had ordered a stress test, abdominal ultrasound and peripheral Dopplers, but he had none of those tests performed. Apparently he lost his insurance and stopped working. He is currently on Medicare with a [employer] supplement. He recently was in [facility] for what he thought was a sinus infection. From the best I can tell he had imaging including a chest x-ray and a head CT. He  was placed on antibiotics however he was told that he had "fluid around his heart". Laboratory work indicated an elevated BNP of 1400. There is no evidence that he was diuresed and an echocardiogram was not performed. Chest x-ray demonstrated hyperinflation with mild linear atelectasis at the lung bases. He was discharged from the emergency department.  With regard to his claudication he reports she's been walking a lot more and he reports his leg pain has improved somewhat.  Phillip Solis returns today for follow-up. He underwent screening abdominal ultrasound as well as lower chimney arterial Dopplers for leg pain. This demonstrated normal ABIs and no evidence for arterial insufficiency. His abdominal aorta was normal. He also underwent an echocardiogram because of his recent episode of heart failure and elevated BNP. This does demonstrate a cardiomyopathy with EF of 35-40%. I'm concerned about underlying coronary artery disease. His symptoms have improved.  PMHx:  Past Medical History  Diagnosis Date  . History of atrial fibrillation     in setting of endoscopy in 2011; resolved  . History of palpitations     No past surgical history on file.  FAMHx:  Family History  Problem Relation Age of Onset  . Heart Problems Mother   . Cancer Father     SOCHx:   reports that he quit smoking about 41 years ago. His smokeless tobacco use includes Chew. He reports that he drinks alcohol. He reports that he does not use illicit drugs.  ALLERGIES:  No Known Allergies  ROS:  A comprehensive review of systems was negative except for: Constitutional: positive for fatigue Respiratory: positive for dyspnea on exertion Cardiovascular: positive for dyspnea and claudication  HOME MEDS: Current Outpatient Prescriptions  Medication Sig Dispense Refill  . amoxicillin (AMOXIL) 500 MG capsule Take 500 mg by mouth 3 (three) times daily. As Directed    . aspirin 325 MG tablet Take 325 mg by mouth daily.    .  bisoprolol (ZEBETA) 5 MG tablet Take 1 tablet (5 mg total) by mouth 2 (two) times daily. 180 tablet 3  . Multiple Vitamin (MULTIVITAMIN) capsule Take 1 capsule by mouth daily.     No current facility-administered medications for this visit.    LABS/IMAGING: No results found for this or any previous visit (from the past 48 hour(s)). No results found.  VITALS: BP 131/80 mmHg  Pulse 85  Ht 5\' 7"  (1.702 m)  Wt 157 lb 1.6 oz (71.26 kg)  BMI 24.60 kg/m2  EXAM: Deferred   EKG: Deferred   ASSESSMENT: 1. Recent hospital visit with elevated BNP and questionable "fluid around his heart" 2. Progressive dyspnea on exertion 3. Claudication - improved 4. Paroxysmal atrial fibrillation on aspirin 5. Extensive former tobacco smoker  PLAN: 1.   Phillip Solis did have an abnormal echocardiogram showing EF of 35-40%. I'm concerned about underlying coronary disease as the etiology. I recommended left and right heart catheterization. We did discuss recent benefits of heart catheterization today and provided informed consent in the office. We'll go and try to schedule that within the next couple of weeks. We'll continue his current medications however likely add an ACE or ARB based on his renal function and continue to up titrate medications for heart failure. He may be a candidate for [employer].  [name], MD, [facility] Attending Cardiologist [facility]  Itzae Miralles C 07/12/2014, 4:58 PM

## 2014-07-15 ENCOUNTER — Other Ambulatory Visit: Payer: Self-pay

## 2014-07-15 DIAGNOSIS — I429 Cardiomyopathy, unspecified: Secondary | ICD-10-CM

## 2014-07-16 LAB — CBC
HCT: 41.8 % (ref 39.0–52.0)
HEMOGLOBIN: 14.1 g/dL (ref 13.0–17.0)
MCH: 31.3 pg (ref 26.0–34.0)
MCHC: 33.7 g/dL (ref 30.0–36.0)
MCV: 92.7 fL (ref 78.0–100.0)
MPV: 11.8 fL (ref 8.6–12.4)
Platelets: 212 10*3/uL (ref 150–400)
RBC: 4.51 MIL/uL (ref 4.22–5.81)
RDW: 13.4 % (ref 11.5–15.5)
WBC: 6.5 10*3/uL (ref 4.0–10.5)

## 2014-07-16 LAB — PROTIME-INR
INR: 0.99 (ref <1.50)
PROTHROMBIN TIME: 13.1 s (ref 11.6–15.2)

## 2014-07-16 LAB — BASIC METABOLIC PANEL
BUN: 14 mg/dL (ref 6–23)
CO2: 28 mEq/L (ref 19–32)
Calcium: 9.2 mg/dL (ref 8.4–10.5)
Chloride: 104 mEq/L (ref 96–112)
Creat: 0.87 mg/dL (ref 0.50–1.35)
Glucose, Bld: 144 mg/dL — ABNORMAL HIGH (ref 70–99)
POTASSIUM: 4.9 meq/L (ref 3.5–5.3)
SODIUM: 144 meq/L (ref 135–145)

## 2014-07-16 LAB — APTT: APTT: 33 s (ref 24–37)

## 2014-07-16 LAB — TSH: TSH: 2.082 u[IU]/mL (ref 0.350–4.500)

## 2014-07-23 ENCOUNTER — Encounter (HOSPITAL_COMMUNITY): Payer: Self-pay | Admitting: *Deleted

## 2014-07-23 ENCOUNTER — Ambulatory Visit (HOSPITAL_COMMUNITY)
Admission: RE | Admit: 2014-07-23 | Discharge: 2014-07-23 | Disposition: A | Discharge status: Home / Self Care | Payer: Medicare HMO | Source: Ambulatory Visit | Attending: Internal Medicine | Admitting: Internal Medicine

## 2014-07-23 ENCOUNTER — Encounter (HOSPITAL_COMMUNITY)
Admission: RE | Disposition: A | Discharge status: Home / Self Care | Payer: Self-pay | Source: Ambulatory Visit | Attending: Internal Medicine

## 2014-07-23 DIAGNOSIS — I428 Other cardiomyopathies: Secondary | ICD-10-CM | POA: Diagnosis present

## 2014-07-23 DIAGNOSIS — I480 Paroxysmal atrial fibrillation: Secondary | ICD-10-CM | POA: Diagnosis not present

## 2014-07-23 DIAGNOSIS — R0609 Other forms of dyspnea: Secondary | ICD-10-CM | POA: Diagnosis present

## 2014-07-23 DIAGNOSIS — I429 Cardiomyopathy, unspecified: Secondary | ICD-10-CM

## 2014-07-23 DIAGNOSIS — I420 Dilated cardiomyopathy: Secondary | ICD-10-CM

## 2014-07-23 HISTORY — PX: LEFT AND RIGHT HEART CATHETERIZATION WITH CORONARY ANGIOGRAM: SHX5449

## 2014-07-23 SURGERY — LEFT AND RIGHT HEART CATHETERIZATION WITH CORONARY ANGIOGRAM
Anesthesia: LOCAL

## 2014-07-23 MED ORDER — LIDOCAINE HCL (PF) 1 % IJ SOLN
INTRAMUSCULAR | Status: AC
  Filled 2014-07-23: qty 30

## 2014-07-23 MED ORDER — SODIUM CHLORIDE 0.9 % IJ SOLN: 3.0000 mL | INTRAMUSCULAR | Status: DC | PRN

## 2014-07-23 MED ORDER — MIDAZOLAM HCL 2 MG/2ML IJ SOLN
INTRAMUSCULAR | Status: AC
  Filled 2014-07-23: qty 2

## 2014-07-23 MED ORDER — HYDROCODONE-ACETAMINOPHEN 5-325 MG PO TABS
1.0000 | ORAL_TABLET | ORAL | Status: DC | PRN
  Administered 2014-07-23: 1 via ORAL

## 2014-07-23 MED ORDER — ASPIRIN 81 MG PO CHEW
CHEWABLE_TABLET | ORAL | Status: AC
  Filled 2014-07-23: qty 1

## 2014-07-23 MED ORDER — HEPARIN (PORCINE) IN NACL 2-0.9 UNIT/ML-% IJ SOLN
INTRAMUSCULAR | Status: AC
  Filled 2014-07-23: qty 1000

## 2014-07-23 MED ORDER — ACETAMINOPHEN 325 MG PO TABS: 650.0000 mg | ORAL_TABLET | ORAL | Status: DC | PRN

## 2014-07-23 MED ORDER — ONDANSETRON HCL 4 MG/2ML IJ SOLN: 4.0000 mg | Freq: Four times a day (QID) | INTRAMUSCULAR | Status: DC | PRN

## 2014-07-23 MED ORDER — SODIUM CHLORIDE 0.9 % IV SOLN: 1.0000 mL/kg/h | INTRAVENOUS | Status: AC

## 2014-07-23 MED ORDER — SODIUM CHLORIDE 0.9 % IV SOLN
INTRAVENOUS | Status: DC
  Administered 2014-07-23: 08:00:00 via INTRAVENOUS

## 2014-07-23 MED ORDER — HYDROCODONE-ACETAMINOPHEN 5-325 MG PO TABS
ORAL_TABLET | ORAL | Status: AC
  Filled 2014-07-23: qty 1

## 2014-07-23 MED ORDER — NITROGLYCERIN 1 MG/10 ML FOR IR/CATH LAB
INTRA_ARTERIAL | Status: AC
  Filled 2014-07-23: qty 10

## 2014-07-23 MED ORDER — ASPIRIN 81 MG PO CHEW
81.0000 mg | CHEWABLE_TABLET | ORAL | Status: AC
  Administered 2014-07-23: 81 mg via ORAL

## 2014-07-23 MED ORDER — FENTANYL CITRATE 0.05 MG/ML IJ SOLN
INTRAMUSCULAR | Status: AC
  Filled 2014-07-23: qty 2

## 2014-07-23 NOTE — Progress Notes (Signed)
Site area: rt groin Site Prior to Removal:  Level 0 Pressure Applied For: 30 minutes Manual:   yes Patient Status During Pull:  stable Post Pull Site:  Level  0 Post Pull Instructions Given:  yes Post Pull Pulses Present: yes Dressing Applied:  tegaderm Bedrest begins @ 1050 Comments: 0 complications

## 2014-07-23 NOTE — CV Procedure (Signed)
CARDIAC CATHETERIZATION REPORT  Phillip Solis   [id number] February 10, 1946  Performing Cardiologist: Phillip Solis Primary Physician: Phillip Lower, MD Primary Cardiologist:  Phillip Solis  Procedures Performed:  Left Heart Catheterization via 5 Fr right femoral artery access  Right Heart Catheterization via 7 Fr right femoral vein access  Indication(s): dyspnea  Pre-Procedural Diagnosis(es): 1. Cardiomyopathy  Post-Procedural Diagnosis(es): 1. Non-ischemic cardiomyopathy 2. Normal coronary arteries  Pre-Procedural Non-invasive testing: Echo showed EF of 35-40%  History: 69 y.o. male presented with is a 69 year old gentleman who I first saw in 2011 with new-onset atrial fibrillation in the setting of vomiting and during an endoscopy to remove food which was stuck in his throat. It resolved spontaneously and he has had no recurrence that I am aware of. He occasionally gets palpitations; however, is maintained on bisoprolol 5 mg twice a day. He also takes aspirin due to a low CHADS2 score, which is probably 0. Unfortunately he does not have a primary care provider. Today's also described increasing shortness of breath over the past several months which is worse he notes when walking long distances, especially across the parking lot at [employer]. He says that when he arrives from his car he is markedly short of breath and notes that he's having pain in his legs that usually discontinues his walk. As he stops the symptoms improved. The symptoms have been going on again for several months but increasing somewhat in frequency. Phillip Solis has a strong smoking history with one and half pack per day smoking for over 30 years but he quit a number of years ago. He has never had an ischemic evaluation to my knowledge. His EKG today does show a interventricular conduction delay and sinus bradycardia with nonspecific T-wave changes.  I saw Phillip Solis back in the office today nearly a year and a half after his  last appointment. He had a number of medical problems which I felt needed further evaluation. I had ordered a stress test, abdominal ultrasound and peripheral Dopplers, but he had none of those tests performed. Apparently he lost his insurance and stopped working. He is currently on Medicare with a [employer] supplement. He recently was in [facility] for what he thought was a sinus infection. From the best I can tell he had imaging including a chest x-ray and a head CT. He was placed on antibiotics however he was told that he had "fluid around his heart". Laboratory work indicated an elevated BNP of 1400. There is no evidence that he was diuresed and an echocardiogram was not performed. Chest x-ray demonstrated hyperinflation with mild linear atelectasis at the lung bases. He was discharged from the emergency department. With regard to his claudication he reports she's been walking a lot more and he reports his leg pain has improved somewhat.  Phillip Solis returns today for follow-up. He underwent screening abdominal ultrasound as well as Solis chimney arterial Dopplers for leg pain. This demonstrated normal ABIs and no evidence for arterial insufficiency. His abdominal aorta was normal. He also underwent an echocardiogram because of his recent episode of heart failure and elevated BNP. This does demonstrate a cardiomyopathy with EF of 35-40%. I'm concerned about underlying coronary artery disease. His symptoms have improved.  Consent: The procedure with Risks/Benefits/Alternatives and Indications were reviewed with the patient (and family).  All questions were answered.    Risks / Complications include, but not limited to: Death, MI, CVA/TIA, VF/VT (with defibrillation), Bradycardia (need for temporary pacer placement), contrast induced nephropathy, bleeding /  bruising / hematoma / pseudoaneurysm, vascular or coronary injury (with possible emergent CT or Vascular Surgery), adverse medication reactions,  infection.    Consent: Risks of procedure as well as the alternatives and risks of each were explained to the (patient/caregiver).  Consent for procedure obtained.  Procedure: The patient was brought to the [facility] Lab in the fasting state and prepped and draped in the usual sterile fashion for (Right groin) access.   Time Out: Verified patient identification, verified procedure, site/side was marked, verified correct patient position, special equipment/implants available, radiation safety measures in place (including badges and shielding), medications/allergies/relevent history reviewed, required imaging and test results available.  Performed  Procedure: The right femoral head was identified using tactile and fluoroscopic technique.  The right groin was anesthetized with 1% subcutaneous Lidocaine.  The right Common Femoral Artery was accessed using the Modified Seldinger Technique with placement of (5 Fr) sheath using the Seldinger technique.  The sheath was aspirated and flushed.   The right Common Femoral Vein was accessed using the Modified Seldinger Technique with placement of (7 Fr) sheath using the Seldinger technique.  The sheath was aspirated and flushed. A 7Fr Swan-Ganz catheter was advanced through the venous sheath into the RA, RV, PA and PCWP positions. Saturations were obtained and cardiac output was measured through the Fick and Thermodilution techniques. The catheter was then completely withdrawn from the body. A 5 Fr JL4 Catheter was then advanced of over a Standard J wire into the ascending Aorta.  The catheter was used to engage the left coronary artery.  Multiple cineangiographic views of the left coronary artery system(s) were performed. A 5 Fr JR4 and ultimately a 3DRC Catheter was advanced of over a Safety J wire into the ascending Aorta.  The catheter was used to engage the right coronary artery.  Multiple cineangiographic views of the right  coronary artery system(s) were performed. This catheter was then exchanged over the Standard J wire for an angled Pigtail catheter that was advanced across the Aortic Valve.  LV hemodynamics were measured (and Left Ventriculography was performed).  LV hemodynamics were then re-sampled, and the catheter was pulled back across the Aortic Valve for measurement of "pull-back" gradient.  The catheter and the wire was removed completely out of the body. The patient was transferred to the holding area where the sheath was removed with manual pressure held for hemostasis.   Recovery: The patient was transported to the cath lab holding area in stable condition.   The patient  was stable before, during and following the procedure.   Patient did tolerate procedure well. There were not complications.  EBL: Minimal  Medications:  Premedication: none  Sedation:  3 mg IV Versed, 50 mcg IV Fentanyl  Contrast:  65 ml Omnipaque   Hemodynamics:  Central Aortic Pressure / Mean Aortic Pressure: 113/64  LV Pressure / LV End diastolic Pressure:  7  Left Ventriculography:  EF:  35-40%  Wall Motion: global hypokinesis  Right Heart Data:  RA - 2   RV - 22/2  PA - 20/5 (12)   PCWP - 4  TPG - 8  FCO/CI - 3.92 L/min, 2.24 L/min  TDCO/CI - 5.94 L/min, 3.39 L/min  SVR - 22 Wood units ( dynscm?5/80)  PVR - 2.04 Wood units ( dynscm?5/80)  PA Sat% - 66  AO Sat% - 97  Coronary Angiographic Data:  Left Main:  Normal  Left Anterior Descending (LAD):  Angiographically normal - smaller caliber,  tapers around the apex.  1st diagonal (D1):  Moderate-sized vessel, no stenosis.  2nd diagonal (D2):  Small to moderate-sized, no stenosis.  Circumflex (LCx):  Angiographically normal, follows the AV groove and gives off a single mid-vessel OM vessel.  1st obtuse marginal:  Angiographically normal  Right Coronary Artery: Dominant vessel. Larger caliber. No stenosis.  right ventricle branch of right  coronary artery: normal  posterior descending artery: No stenosis  posterior lateral branch:  No stenosis  Impression: 1.  Angiographically normal coronary arteries 2.  Severe global hypokinesis, LVEF 35-40% 3.  Compensated right and left heart filling pressures 4.  Moderately decreased cardiac output  Plan: 1.  Medical therapy for non-ischemic cardiomyopathy - will likely add Entresto when he is out of the risk of contrast nephropathy. Continue bisoprolol. 2.  Given history of PAF (previously on ASA), his CHADSVASC score is now 1-2 - will need to consider starting a NOAC. 3.  Medication adjustment at follow-up with me in 2 weeks.  The case and results was discussed with the patient and family if available.  The case and results was not discussed with the patient's PCP. The case and results was discussed with the patient's Cardiologist.  Time Spent Directly with the Patient:  60 minutes  Phillip Casino, MD, [facility] Attending Cardiologist [facility]  Lawanna Cecere C 07/23/2014, 12:06 PM

## 2014-07-23 NOTE — Discharge Instructions (Signed)
Coronary Angiogram A coronary angiogram, also called coronary angiography, is an X-ray procedure used to look at the arteries in the heart. In this procedure, a dye (contrast dye) is injected through a long, hollow tube (catheter). The catheter is about the size of a piece of cooked spaghetti and is inserted through your groin, wrist, or arm. The dye is injected into each artery, and X-rays are then taken to show if there is a blockage in the arteries of your heart. LET [facility] CARE PROVIDER KNOW ABOUT:  Any allergies you have, including allergies to shellfish or contrast dye.   All medicines you are taking, including vitamins, herbs, eye drops, creams, and over-the-counter medicines.   Previous problems you or members of your family have had with the use of anesthetics.   Any blood disorders you have.   Previous surgeries you have had.  History of kidney problems or failure.   Other medical conditions you have. RISKS AND COMPLICATIONS  Generally, a coronary angiogram is a safe procedure. However, problems can occur and include:  Allergic reaction to the dye.  Bleeding from the access site or other locations.  Kidney injury, especially in people with impaired kidney function.  Stroke (rare).  Heart attack (rare). BEFORE THE PROCEDURE   Do not eat or drink anything after midnight the night before the procedure or as directed by your health care provider.   Ask your health care provider about changing or stopping your regular medicines. This is especially important if you are taking diabetes medicines or blood thinners. PROCEDURE  You may be given a medicine to help you relax (sedative) before the procedure. This medicine is given through an intravenous (IV) access tube that is inserted into one of your veins.   The area where the catheter will be inserted will be washed and shaved. This is usually done in the groin but may be done in the fold of your arm (near your  elbow) or in the wrist.   A medicine will be given to numb the area where the catheter will be inserted (local anesthetic).   The health care provider will insert the catheter into an artery. The catheter will be guided by using a special type of X-ray (fluoroscopy) of the blood vessel being examined.   A special dye will then be injected into the catheter, and X-rays will be taken. The dye will help to show where any narrowing or blockages are located in the heart arteries.  AFTER THE PROCEDURE   If the procedure is done through the leg, you will be kept in bed lying flat for several hours. You will be instructed to not bend or cross your legs.  The insertion site will be checked frequently.   The pulse in your feet or wrist will be checked frequently.   Additional blood tests, X-rays, and an electrocardiogram may be done.  Document Released: 10/31/2002 Document Revised: 09/10/2013 Document Reviewed: 09/18/2012 [facility] Patient Information 2015 [place], [place]. This information is not intended to replace advice given to you by your health care provider. Make sure you discuss any questions you have with your health care provider.  NOTE:  I will make adjustments to your medications when you follow-up with me in the office.  Dr. Debara Pickett     Arteriogram Care After These instructions give you information on caring for yourself after your procedure. Your doctor may also give you more specific instructions. Call your doctor if you have any problems or questions after your  procedure. HOME CARE  Keep your leg straight for at least 6 hours.  Do not bathe, swim, or use a hot tub until directed by your doctor. You can shower.  Do not lift anything heavier than 10 pounds (about a gallon of milk) for 2 days.  Do not walk a lot, run, or drive for 2 days.  Return to normal activities in 2 days or as told by your doctor. Finding out the results of your test Ask when your test results  will be ready. Make sure you get your test results. GET HELP RIGHT AWAY IF:   You have fever.  You have more pain in your leg.  The leg that was cut is:  Bleeding.  Puffy (swollen) or red.  Cold.  Pale or changes color.  Weak.  Tingly or numb. If you go to the Emergency Room, tell your nurse that you have had an arteriogram. Take this paper with you to show the nurse. MAKE SURE YOU:  Understand these instructions.  Will watch your condition.  Will get help right away if you are not doing well or get worse. Document Released: 07/23/2008 Document Revised: 05/01/2013 Document Reviewed: 07/23/2008 [facility] Patient Information 2015 [place], [place]. This information is not intended to replace advice given to you by your health care provider. Make sure you discuss any questions you have with your health care provider.

## 2014-07-23 NOTE — H&P (Signed)
     INTERVAL PROCEDURE H&P  History and Physical Interval Note:  07/23/2014 8:19 AM  Phillip Solis has presented today for their planned procedure. The various methods of treatment have been discussed with the patient and family. After consideration of risks, benefits and other options for treatment, the patient has consented to the procedure.  The patients' outpatient history has been reviewed, patient examined, and no change in status from most recent office note within the past 30 days. I have reviewed the patients' chart and labs and will proceed as planned. Questions were answered to the patient's satisfaction.   [name], MD, [facility] Attending Cardiologist [facility]  Ariel Wingrove C 07/23/2014, 8:19 AM

## 2014-07-24 ENCOUNTER — Telehealth: Payer: Self-pay | Admitting: Internal Medicine

## 2014-07-24 LAB — POCT I-STAT 3, ART BLOOD GAS (G3+)
Acid-base deficit: 4 mmol/L — ABNORMAL HIGH (ref 0.0–2.0)
BICARBONATE: 20.7 meq/L (ref 20.0–24.0)
O2 Saturation: 97 %
TCO2: 22 mmol/L (ref 0–100)
pCO2 arterial: 33.9 mmHg — ABNORMAL LOW (ref 35.0–45.0)
pH, Arterial: 7.395 (ref 7.350–7.450)
pO2, Arterial: 87 mmHg (ref 80.0–100.0)

## 2014-07-24 NOTE — Telephone Encounter (Signed)
Pt took tylenol yesterday & helped. Advised pt tylenol OK. Pt voiced understanding.

## 2014-07-24 NOTE — Telephone Encounter (Signed)
Pt had Cath yesterday. He is sore and having some pain,he would like something for pain please.

## 2014-07-25 LAB — POCT I-STAT 3, VENOUS BLOOD GAS (G3P V)
Acid-base deficit: 3 mmol/L — ABNORMAL HIGH (ref 0.0–2.0)
BICARBONATE: 22.7 meq/L (ref 20.0–24.0)
O2 Saturation: 66 %
PCO2 VEN: 40 mmHg — AB (ref 45.0–50.0)
PH VEN: 7.361 — AB (ref 7.250–7.300)
PO2 VEN: 35 mmHg (ref 30.0–45.0)
TCO2: 24 mmol/L (ref 0–100)

## 2014-08-21 ENCOUNTER — Ambulatory Visit (INDEPENDENT_AMBULATORY_CARE_PROVIDER_SITE_OTHER): Payer: Medicare HMO | Admitting: Internal Medicine

## 2014-08-21 ENCOUNTER — Encounter: Payer: Self-pay | Admitting: Internal Medicine

## 2014-08-21 VITALS — BP 122/78 | HR 70 | Ht 66.0 in | Wt 161.1 lb

## 2014-08-21 DIAGNOSIS — R0609 Other forms of dyspnea: Secondary | ICD-10-CM | POA: Diagnosis not present

## 2014-08-21 DIAGNOSIS — I480 Paroxysmal atrial fibrillation: Secondary | ICD-10-CM

## 2014-08-21 DIAGNOSIS — I429 Cardiomyopathy, unspecified: Secondary | ICD-10-CM | POA: Diagnosis not present

## 2014-08-21 MED ORDER — SACUBITRIL-VALSARTAN 24-26 MG PO TABS: 1.0000 | ORAL_TABLET | Freq: Two times a day (BID) | ORAL | Status: DC

## 2014-08-21 NOTE — Patient Instructions (Addendum)
START Entresto 24-26mg  twice daily  Samples provided: #70 Lot: [id number] Exp: 08/2015   Your physician recommends that you schedule a follow-up appointment in: 3 months

## 2014-08-21 NOTE — Progress Notes (Signed)
OFFICE NOTE  Chief Complaint:  Follow-up catheterization  Primary Care Physician: Teressa Lower, MD  HPI:  Phillip Solis is a 69 year old gentleman who I first saw in 2011 with new-onset atrial fibrillation in the setting of vomiting and during an endoscopy to remove food which was stuck in his throat. It resolved spontaneously and he has had no recurrence that I am aware of. He occasionally gets palpitations; however, is maintained on bisoprolol 5 mg twice a day. He also takes aspirin due to a low CHADS2 score, which is probably 0. Unfortunately he does not have a primary care provider. Today's also described increasing shortness of breath over the past several months which is worse he notes when walking long distances, especially across the parking lot at [employer]. He says that when he arrives from his car he is markedly short of breath and notes that he's having pain in his legs that usually discontinues his walk. As he stops the symptoms improved. The symptoms have been going on again for several months but increasing somewhat in frequency. Phillip Solis has a strong smoking history with one and half pack per day smoking for over 30 years but he quit a number of years ago.  He has never had an ischemic evaluation to my knowledge. His EKG today does show a interventricular conduction delay and sinus bradycardia with nonspecific T-wave changes.  I saw Phillip Solis back in the office today nearly a year and a half after his last appointment. He had a number of medical problems which I felt needed further evaluation. I had ordered a stress test, abdominal ultrasound and peripheral Dopplers, but he had none of those tests performed. Apparently he lost his insurance and stopped working. He is currently on Medicare with a [employer] supplement. He recently was in [facility] for what he thought was a sinus infection. From the best I can tell he had imaging including a chest x-ray and a head CT. He was  placed on antibiotics however he was told that he had "fluid around his heart". Laboratory work indicated an elevated BNP of 1400. There is no evidence that he was diuresed and an echocardiogram was not performed. Chest x-ray demonstrated hyperinflation with mild linear atelectasis at the lung bases. He was discharged from the emergency department.  With regard to his claudication he reports she's been walking a lot more and he reports his leg pain has improved somewhat.  Phillip Solis returns today for follow-up. He underwent screening abdominal ultrasound as well as lower chimney arterial Dopplers for leg pain. This demonstrated normal ABIs and no evidence for arterial insufficiency. His abdominal aorta was normal. He also underwent an echocardiogram because of his recent episode of heart failure and elevated BNP. This does demonstrate a cardiomyopathy with EF of 35-40%. I'm concerned about underlying coronary artery disease. His symptoms have improved.  I saw Phillip Solis back in the office today. He underwent left and right heart catheterization by myself, the results are as follows:  Hemodynamics: Central Aortic Pressure / Mean Aortic Pressure: 113/64 LV Pressure / LV End diastolic Pressure: 7  Left Ventriculography: EF: 35-40% Wall Motion: global hypokinesis  Right Heart Data:  RA - 2  RV - 22/2  PA - 20/5 (12)   PCWP - 4  TPG - 8  FCO/CI - 3.92 L/min, 2.24 L/min  TDCO/CI - 5.94 L/min, 3.39 L/min  SVR - 22 Wood units ( dynscm?5/80)  PVR - 2.04 Wood units ( dynscm?5/80)  PA  Sat% - 66  AO Sat% - 97  Coronary Angiographic Data:  Left Main: Normal  Left Anterior Descending (LAD): Angiographically normal - smaller caliber, tapers around the apex.  1st diagonal (D1): Moderate-sized vessel, no stenosis.  2nd diagonal (D2): Small to moderate-sized, no stenosis.  Circumflex (LCx): Angiographically normal, follows the  AV groove and gives off a single mid-vessel OM vessel.  1st obtuse marginal: Angiographically normal  Right Coronary Artery: Dominant vessel. Larger caliber. No stenosis.  right ventricle branch of right coronary artery: normal  posterior descending artery: No stenosis  posterior lateral branch: No stenosis  Impression: 1. Angiographically normal coronary arteries 2. Severe global hypokinesis, LVEF 35-40% 3. Compensated right and left heart filling pressures 4. Moderately decreased cardiac output  He's had no problems from his catheterization. Overall he feels well. As mentioned above his right heart catheterization shows well compensated filling pressures, but he is not on diuretic. He's currently not on an ACE or ARB, and I believe he did good candidate to start on and trust oh. This would give him some benefit from the ARB as well as natruretic.  PMHx:  Past Medical History  Diagnosis Date  . History of atrial fibrillation     in setting of endoscopy in 2011; resolved  . History of palpitations     Past Surgical History  Procedure Laterality Date  . Left and right heart catheterization with coronary angiogram N/A 07/23/2014    Procedure: LEFT AND RIGHT HEART CATHETERIZATION WITH CORONARY ANGIOGRAM;  Surgeon: [name], MD;  Location: [facility] CATH LAB;  Service: Cardiovascular;  Laterality: N/A;    FAMHx:  Family History  Problem Relation Age of Onset  . Heart Problems Mother   . Cancer Father     SOCHx:   reports that he quit smoking about 41 years ago. His smokeless tobacco use includes Chew. He reports that he drinks alcohol. He reports that he does not use illicit drugs.  ALLERGIES:  No Known Allergies  ROS: A comprehensive review of systems was negative.  HOME MEDS: Current Outpatient Prescriptions  Medication Sig Dispense Refill  . aspirin 325 MG tablet Take 325 mg by mouth daily.    . bisoprolol (ZEBETA) 5 MG tablet Take 1 tablet (5 mg total) by  mouth 2 (two) times daily. 180 tablet 3  . Multiple Vitamin (MULTIVITAMIN WITH MINERALS) TABS tablet Take 1 tablet by mouth daily.    . Niacin, Antihyperlipidemic, (NIASPAN PO) Take 1 tablet by mouth daily.    . sacubitril-valsartan (ENTRESTO) 24-26 MG Take 1 tablet by mouth 2 (two) times daily.     No current facility-administered medications for this visit.    LABS/IMAGING: No results found for this or any previous visit (from the past 48 hour(s)). No results found.  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   Mr. Stanke underwent right and left heart catheterization which showed normal coronaries and normal filling pressures with decreased cardiac output. He is currently on bisoprolol and aspirin. I like to add Entresto 24/26 mg daily to his regimen and we'll see him back in a few months to see if this could be uptitrated. We may not be able  to do to his lower filling pressures and low normal blood pressure. Overall he feels really well. His claudication symptoms have improved therefore will not pursue that any further at this time. After being established on these medications he'll need another echocardiogram in somewhere between 6-12 months.  [name], MD, [facility] Attending Cardiologist [facility]  HILTY,Kenneth C 08/21/2014, 3:24 PM

## 2014-08-26 ENCOUNTER — Telehealth: Payer: Self-pay | Admitting: *Deleted

## 2014-08-26 NOTE — Telephone Encounter (Signed)
Faxed PA for entresto 24-26mg  BID to [facility] Faxed form to entresto central [phone]

## 2014-08-28 NOTE — Telephone Encounter (Signed)
Entresto 24/'26mg'$  #60 per 30 days approved until 08/25/2016 Patient enrolled in [facility] for all doses of the medication

## 2014-08-28 NOTE — Telephone Encounter (Signed)
Called patient and notified him that [name] was approved and advised him that he should call [employer] to receive co-pay assistance.

## 2014-09-04 NOTE — Telephone Encounter (Signed)
Called [facility] Patient Support to verify that med was approved and patient is enrolled.  Per rep, patient may need to have another prior authorization done for a different dose of the medication

## 2014-10-11 ENCOUNTER — Telehealth: Payer: Self-pay | Admitting: Internal Medicine

## 2014-10-11 NOTE — Telephone Encounter (Signed)
Pt called in stating that he needs some more samples of Entresto. Please call  Thanks

## 2014-10-11 NOTE — Telephone Encounter (Signed)
Spoke with pt, he has called the company about patient assistance but was not able to talk to anyone. Different telephone number given to patient. Samples given to the patient.

## 2014-11-01 ENCOUNTER — Telehealth: Payer: Self-pay | Admitting: Internal Medicine

## 2014-11-01 NOTE — Telephone Encounter (Signed)
Spoke with pt, Wednesday morning he took his medications and they got hung in his throat. Today he is unable to swallow anything, he has vomited everything he has tried to drink. He had a little trouble breathing this morning but states that has settled down. He does sound hoarse on the phone. He feels there is still something hung in his throat. Advised patient to go to the nearest urgent care or ER to get assessed. Patient voiced understanding

## 2014-11-07 ENCOUNTER — Telehealth: Payer: Self-pay | Admitting: Internal Medicine

## 2014-11-07 NOTE — Telephone Encounter (Signed)
Got instruction on this from [place], Sheral Apley RN.  Called patient back, left VM - instruction to call [employer] at their toll-free number for patient assistance enrollment. Pt also instructed to call back here if further clarification required.

## 2014-11-07 NOTE — Telephone Encounter (Signed)
Called and spoke to patient. Notes he called Entresto enrollment number we provided, they do not have a record of his enrollment. He has been covered w/ samples from our office since April.  Notes on last dose today - wants to know if we can fix this problem today. Did not inform me as to why he hadn't called earlier to address this problem.  Advised patient I will inquire on enrollment and see what needs to be done, will call back with further information.  Routing to [place]

## 2014-11-07 NOTE — Telephone Encounter (Signed)
Pt says he is unable to get his Entresto,please call.

## 2014-11-08 ENCOUNTER — Telehealth: Payer: Self-pay | Admitting: Internal Medicine

## 2014-11-08 DIAGNOSIS — I429 Cardiomyopathy, unspecified: Secondary | ICD-10-CM

## 2014-11-08 DIAGNOSIS — R0609 Other forms of dyspnea: Principal | ICD-10-CM

## 2014-11-08 MED ORDER — SACUBITRIL-VALSARTAN 24-26 MG PO TABS: 1.0000 | ORAL_TABLET | Freq: Two times a day (BID) | ORAL | Status: DC

## 2014-11-08 NOTE — Telephone Encounter (Signed)
R/t call from yesterday Instructed patient on entresto patient assistance enrollment - gave phone number to call [employer] to get set up for patient assistance eligibility. Sent refill to preferred pharmacy. Patient verbalized understanding of instructions.

## 2014-11-08 NOTE — Telephone Encounter (Signed)
Per The Answering Service: Pt is out of medicine,his Entresto.His Entresto was supposed to be called in,Pharmacist had not received it.

## 2014-11-20 ENCOUNTER — Ambulatory Visit (INDEPENDENT_AMBULATORY_CARE_PROVIDER_SITE_OTHER): Payer: Medicare HMO | Admitting: Internal Medicine

## 2014-11-20 ENCOUNTER — Encounter: Payer: Self-pay | Admitting: Internal Medicine

## 2014-11-20 VITALS — BP 100/60 | HR 72 | Ht 67.0 in | Wt 161.0 lb

## 2014-11-20 DIAGNOSIS — F17201 Nicotine dependence, unspecified, in remission: Secondary | ICD-10-CM | POA: Diagnosis not present

## 2014-11-20 DIAGNOSIS — I480 Paroxysmal atrial fibrillation: Secondary | ICD-10-CM | POA: Diagnosis not present

## 2014-11-20 DIAGNOSIS — I429 Cardiomyopathy, unspecified: Secondary | ICD-10-CM

## 2014-11-20 DIAGNOSIS — R0609 Other forms of dyspnea: Secondary | ICD-10-CM

## 2014-11-20 DIAGNOSIS — I428 Other cardiomyopathies: Secondary | ICD-10-CM

## 2014-11-20 DIAGNOSIS — I420 Dilated cardiomyopathy: Secondary | ICD-10-CM

## 2014-11-20 NOTE — Progress Notes (Signed)
OFFICE NOTE  Chief Complaint:  Follow-up  Primary Care Physician: Teressa Lower, MD  HPI:  Phillip Solis is a 69 year old gentleman who I first saw in 2011 with new-onset atrial fibrillation in the setting of vomiting and during an endoscopy to remove food which was stuck in his throat. It resolved spontaneously and he has had no recurrence that I am aware of. He occasionally gets palpitations; however, is maintained on bisoprolol 5 mg twice a day. He also takes aspirin due to a low CHADS2 score, which is probably 0. Unfortunately he does not have a primary care provider. Today's also described increasing shortness of breath over the past several months which is worse he notes when walking long distances, especially across the parking lot at [employer]. He says that when he arrives from his car he is markedly short of breath and notes that he's having pain in his legs that usually discontinues his walk. As he stops the symptoms improved. The symptoms have been going on again for several months but increasing somewhat in frequency. Phillip Solis has a strong smoking history with one and half pack per day smoking for over 30 years but he quit a number of years ago.  He has never had an ischemic evaluation to my knowledge. His EKG today does show a interventricular conduction delay and sinus bradycardia with nonspecific T-wave changes.  I saw Phillip Solis back in the office today nearly a year and a half after his last appointment. He had a number of medical problems which I felt needed further evaluation. I had ordered a stress test, abdominal ultrasound and peripheral Dopplers, but he had none of those tests performed. Apparently he lost his insurance and stopped working. He is currently on Medicare with a [employer] supplement. He recently was in [facility] for what he thought was a sinus infection. From the best I can tell he had imaging including a chest x-ray and a head CT. He was placed on antibiotics  however he was told that he had "fluid around his heart". Laboratory work indicated an elevated BNP of 1400. There is no evidence that he was diuresed and an echocardiogram was not performed. Chest x-ray demonstrated hyperinflation with mild linear atelectasis at the lung bases. He was discharged from the emergency department.  With regard to his claudication he reports she's been walking a lot more and he reports his leg pain has improved somewhat.  Phillip Solis returns today for follow-up. He underwent screening abdominal ultrasound as well as lower chimney arterial Dopplers for leg pain. This demonstrated normal ABIs and no evidence for arterial insufficiency. His abdominal aorta was normal. He also underwent an echocardiogram because of his recent episode of heart failure and elevated BNP. This does demonstrate a cardiomyopathy with EF of 35-40%. I'm concerned about underlying coronary artery disease. His symptoms have improved.  I saw Phillip Solis back in the office today. He underwent left and right heart catheterization by myself, the results are as follows:  Hemodynamics: Central Aortic Pressure / Mean Aortic Pressure: 113/64 LV Pressure / LV End diastolic Pressure: 7  Left Ventriculography: EF: 35-40% Wall Motion: global hypokinesis  Right Heart Data:  RA - 2  RV - 22/2  PA - 20/5 (12)   PCWP - 4  TPG - 8  FCO/CI - 3.92 L/min, 2.24 L/min  TDCO/CI - 5.94 L/min, 3.39 L/min  SVR - 22 Wood units ( dynscm?5/80)  PVR - 2.04 Wood units ( dynscm?5/80)  PA Sat% -  69  AO Sat% - 97  Coronary Angiographic Data:  Left Main: Normal  Left Anterior Descending (LAD): Angiographically normal - smaller caliber, tapers around the apex.  1st diagonal (D1): Moderate-sized vessel, no stenosis.  2nd diagonal (D2): Small to moderate-sized, no stenosis.  Circumflex (LCx): Angiographically normal, follows the AV groove and gives  off a single mid-vessel OM vessel.  1st obtuse marginal: Angiographically normal  Right Coronary Artery: Dominant vessel. Larger caliber. No stenosis.  right ventricle branch of right coronary artery: normal  posterior descending artery: No stenosis  posterior lateral branch: No stenosis  Impression: 1. Angiographically normal coronary arteries 2. Severe global hypokinesis, LVEF 35-40% 3. Compensated right and left heart filling pressures 4. Moderately decreased cardiac output  He's had no problems from his catheterization. Overall he feels well. As mentioned above his right heart catheterization shows well compensated filling pressures, but he is not on diuretic. He's currently not on an ACE or ARB, and I believe he did good candidate to start on and trust oh. This would give him some benefit from the ARB as well as natruretic.  Phillip Solis returns today for follow-up. He reports he is feeling well. At last visit I started him on Entresto. He seems to be tolerating this although blood pressure remains low at 100/60 and has not allowed up titration of the medicine. Unfortunately he is not yet established a coverage from the provider so cost of the medicine is is not affordable. We'll provide him with more samples today however he does need to contact patient assistance. He's also on bisoprolol and aspirin. He denies any shortness of breath and has not had any congestive symptoms and therefore he is not currently on any diaphoretic other than the sacubitril.  PMHx:  Past Medical History  Diagnosis Date  . History of atrial fibrillation     in setting of endoscopy in 2011; resolved  . History of palpitations     Past Surgical History  Procedure Laterality Date  . Left and right heart catheterization with coronary angiogram N/A 07/23/2014    Procedure: LEFT AND RIGHT HEART CATHETERIZATION WITH CORONARY ANGIOGRAM;  Surgeon: [name], MD;  Location: [facility] CATH LAB;  Service:  Cardiovascular;  Laterality: N/A;    FAMHx:  Family History  Problem Relation Age of Onset  . Heart Problems Mother   . Cancer Father     SOCHx:   reports that he quit smoking about 41 years ago. His smokeless tobacco use includes Chew. He reports that he drinks alcohol. He reports that he does not use illicit drugs.  ALLERGIES:  No Known Allergies  ROS: A comprehensive review of systems was negative.  HOME MEDS: Current Outpatient Prescriptions  Medication Sig Dispense Refill  . aspirin 325 MG tablet Take 325 mg by mouth daily.    . bisoprolol (ZEBETA) 5 MG tablet Take 1 tablet (5 mg total) by mouth 2 (two) times daily. 180 tablet 3  . Multiple Vitamin (MULTIVITAMIN WITH MINERALS) TABS tablet Take 1 tablet by mouth daily.    . Niacin, Antihyperlipidemic, (NIASPAN PO) Take 1 tablet by mouth daily.    . sacubitril-valsartan (ENTRESTO) 24-26 MG Take 1 tablet by mouth 2 (two) times daily. 60 tablet 3   No current facility-administered medications for this visit.    LABS/IMAGING: No results found for this or any previous visit (from the past 48 hour(s)). No results found.  VITALS: BP 100/60 mmHg  Pulse 72  Ht '5\' 7"'$  (1.702 m)  Wt 161 lb (73.029 kg)  BMI 25.21 kg/m2  EXAM: General appearance: alert and no distress Neck: no carotid bruit and no JVD Lungs: clear to auscultation bilaterally Heart: regular rate and rhythm, S1, S2 normal, no murmur, click, rub or gallop Abdomen: soft, non-tender; bowel sounds normal; no masses,  no organomegaly Extremities: extremities normal, atraumatic, no cyanosis or edema Pulses: 2+ and symmetric Skin: Skin color, texture, turgor normal. 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   Mr. Nims is asymptomatic at this point. He is to be well compensated with regards to his heart failure. He was recently placed on Entresto and is also on bisoprolol. We'll continue these current medications, but cannot up titrate them currently due to his low blood pressure. Overall he is doing well and will plan to see him back in about 3 months at which time will have a repeat echocardiogram to review.   [name], MD, [facility] Attending Cardiologist [place] 11/20/2014, 12:55 PM

## 2014-11-20 NOTE — Patient Instructions (Signed)
Your physician has requested that you have an echocardiogram in October. Echocardiography is a painless test that uses sound waves to create images of your heart. It provides your doctor with information about the size and shape of your heart and how well your heart's chambers and valves are working. This procedure takes approximately one hour. There are no restrictions for this procedure.  Your physician recommends that you schedule a follow-up appointment after your echo

## 2015-01-17 ENCOUNTER — Telehealth: Payer: Self-pay | Admitting: Internal Medicine

## 2015-01-17 MED ORDER — BISOPROLOL FUMARATE 5 MG PO TABS: 5.0000 mg | ORAL_TABLET | Freq: Two times a day (BID) | ORAL | Status: DC

## 2015-01-17 NOTE — Telephone Encounter (Signed)
°  1. Which medications need to be refilled? Bisoprolol '5mg'$    2. Which pharmacy is medication to be sent to?[facility]   3. Do they need a 30 day or 90 day supply? 90  4. Would they like a call back once the medication has been sent to the pharmacy? yes

## 2015-01-17 NOTE — Telephone Encounter (Signed)
Rx(s) sent to pharmacy electronically. Patient notified. 

## 2015-02-20 ENCOUNTER — Other Ambulatory Visit: Payer: Self-pay

## 2015-02-20 ENCOUNTER — Telehealth: Payer: Self-pay | Admitting: Internal Medicine

## 2015-02-20 ENCOUNTER — Ambulatory Visit (HOSPITAL_COMMUNITY): Payer: Medicare HMO | Attending: Cardiology

## 2015-02-20 DIAGNOSIS — I340 Nonrheumatic mitral (valve) insufficiency: Secondary | ICD-10-CM | POA: Diagnosis not present

## 2015-02-20 DIAGNOSIS — I480 Paroxysmal atrial fibrillation: Secondary | ICD-10-CM | POA: Diagnosis not present

## 2015-02-20 DIAGNOSIS — I428 Other cardiomyopathies: Secondary | ICD-10-CM

## 2015-02-20 DIAGNOSIS — I429 Cardiomyopathy, unspecified: Secondary | ICD-10-CM | POA: Diagnosis not present

## 2015-02-20 DIAGNOSIS — R0609 Other forms of dyspnea: Principal | ICD-10-CM

## 2015-02-20 DIAGNOSIS — I071 Rheumatic tricuspid insufficiency: Secondary | ICD-10-CM | POA: Diagnosis not present

## 2015-02-20 DIAGNOSIS — Z87891 Personal history of nicotine dependence: Secondary | ICD-10-CM | POA: Insufficient documentation

## 2015-02-20 MED ORDER — SACUBITRIL-VALSARTAN 24-26 MG PO TABS: 1.0000 | ORAL_TABLET | Freq: Two times a day (BID) | ORAL | Status: DC

## 2015-02-20 NOTE — Telephone Encounter (Signed)
Call from [facility], pt requesting samples - informed samples available at front desk.

## 2015-02-20 NOTE — Telephone Encounter (Signed)
Pt need samples of Entresto 24/26,he takes 2 a day.

## 2015-03-03 ENCOUNTER — Telehealth: Payer: Self-pay | Admitting: Pharmacist Clinician (PhC)/ Clinical Pharmacy Specialist

## 2015-03-03 NOTE — Telephone Encounter (Signed)
Called [facility] support center - gave information for Dr. Debara Pickett and clarified patient status.  He will need to call them with his wife's income.  Then if he pre-qualifies, they will send him monthly Entresto for 3 months while they determine his eligibility for patient assistance.    Called patient and relayed this information.  He will call today or tomorrow

## 2015-05-21 ENCOUNTER — Ambulatory Visit (INDEPENDENT_AMBULATORY_CARE_PROVIDER_SITE_OTHER): Payer: Medicare HMO | Admitting: Internal Medicine

## 2015-05-21 ENCOUNTER — Encounter: Payer: Self-pay | Admitting: Internal Medicine

## 2015-05-21 VITALS — BP 104/70 | HR 71 | Ht 67.0 in | Wt 164.7 lb

## 2015-05-21 DIAGNOSIS — I480 Paroxysmal atrial fibrillation: Secondary | ICD-10-CM | POA: Diagnosis not present

## 2015-05-21 DIAGNOSIS — I429 Cardiomyopathy, unspecified: Secondary | ICD-10-CM

## 2015-05-21 DIAGNOSIS — R0609 Other forms of dyspnea: Secondary | ICD-10-CM | POA: Diagnosis not present

## 2015-05-21 DIAGNOSIS — I420 Dilated cardiomyopathy: Secondary | ICD-10-CM

## 2015-05-21 MED ORDER — ASPIRIN EC 81 MG PO TBEC
81.0000 mg | DELAYED_RELEASE_TABLET | Freq: Every day | ORAL | Status: DC
Start: 2015-05-21 — End: 2015-07-18

## 2015-05-21 MED ORDER — BISOPROLOL FUMARATE 5 MG PO TABS: 5.0000 mg | ORAL_TABLET | Freq: Two times a day (BID) | ORAL | Status: DC

## 2015-05-21 MED ORDER — SACUBITRIL-VALSARTAN 49-51 MG PO TABS: 1.0000 | ORAL_TABLET | Freq: Two times a day (BID) | ORAL | Status: DC

## 2015-05-21 NOTE — Progress Notes (Signed)
OFFICE NOTE  Chief Complaint:  Follow-up  Primary Care Physician: Teressa Lower, MD  HPI:  Phillip Solis is a 70 year old gentleman who I first saw in 2011 with new-onset atrial fibrillation in the setting of vomiting and during an endoscopy to remove food which was stuck in his throat. It resolved spontaneously and he has had no recurrence that I am aware of. He occasionally gets palpitations; however, is maintained on bisoprolol 5 mg twice a day. He also takes aspirin due to a low CHADS2 score, which is probably 0. Unfortunately he does not have a primary care provider. Today's also described increasing shortness of breath over the past several months which is worse he notes when walking long distances, especially across the parking lot at [employer]. He says that when he arrives from his car he is markedly short of breath and notes that he's having pain in his legs that usually discontinues his walk. As he stops the symptoms improved. The symptoms have been going on again for several months but increasing somewhat in frequency. Phillip Solis has a strong smoking history with one and half pack per day smoking for over 30 years but he quit a number of years ago.  He has never had an ischemic evaluation to my knowledge. His EKG today does show a interventricular conduction delay and sinus bradycardia with nonspecific T-wave changes.  I saw Phillip Solis back in the office today nearly a year and a half after his last appointment. He had a number of medical problems which I felt needed further evaluation. I had ordered a stress test, abdominal ultrasound and peripheral Dopplers, but he had none of those tests performed. Apparently he lost his insurance and stopped working. He is currently on Medicare with a [employer] supplement. He recently was in [facility] for what he thought was a sinus infection. From the best I can tell he had imaging including a chest x-ray and a head CT. He was placed on antibiotics  however he was told that he had "fluid around his heart". Laboratory work indicated an elevated BNP of 1400. There is no evidence that he was diuresed and an echocardiogram was not performed. Chest x-ray demonstrated hyperinflation with mild linear atelectasis at the lung bases. He was discharged from the emergency department.  With regard to his claudication he reports she's been walking a lot more and he reports his leg pain has improved somewhat.  Phillip Solis returns today for follow-up. He underwent screening abdominal ultrasound as well as lower extremity arterial Dopplers for leg pain. This demonstrated normal ABIs and no evidence for arterial insufficiency. His abdominal aorta was normal. He also underwent an echocardiogram because of his recent episode of heart failure and elevated BNP. This does demonstrate a cardiomyopathy with EF of 35-40%. I'm concerned about underlying coronary artery disease. His symptoms have improved.  I saw Phillip Solis back in the office today. He underwent left and right heart catheterization by myself, the results are as follows:  Hemodynamics: Central Aortic Pressure / Mean Aortic Pressure: 113/64 LV Pressure / LV End diastolic Pressure: 7  Left Ventriculography: EF: 35-40% Wall Motion: global hypokinesis  Right Heart Data:  RA - 2  RV - 22/2  PA - 20/5 (12)   PCWP - 4  TPG - 8  FCO/CI - 3.92 L/min, 2.24 L/min  TDCO/CI - 5.94 L/min, 3.39 L/min  SVR - 22 Wood units ( dynscm?5/80)  PVR - 2.04 Wood units ( dynscm?5/80)  PA Sat% -  18  AO Sat% - 97  Coronary Angiographic Data:  Left Main: Normal  Left Anterior Descending (LAD): Angiographically normal - smaller caliber, tapers around the apex.  1st diagonal (D1): Moderate-sized vessel, no stenosis.  2nd diagonal (D2): Small to moderate-sized, no stenosis.  Circumflex (LCx): Angiographically normal, follows the AV groove and gives  off a single mid-vessel OM vessel.  1st obtuse marginal: Angiographically normal  Right Coronary Artery: Dominant vessel. Larger caliber. No stenosis.  right ventricle branch of right coronary artery: normal  posterior descending artery: No stenosis  posterior lateral branch: No stenosis  Impression: 1. Angiographically normal coronary arteries 2. Severe global hypokinesis, LVEF 35-40% 3. Compensated right and left heart filling pressures 4. Moderately decreased cardiac output  He's had no problems from his catheterization. Overall he feels well. As mentioned above his right heart catheterization shows well compensated filling pressures, but he is not on diuretic. He's currently not on an ACE or ARB, and I believe he did good candidate to start on and trust oh. This would give him some benefit from the ARB as well as natruretic.  Phillip Solis returns today for follow-up. He reports he is feeling well. At last visit I started him on Entresto. He seems to be tolerating this although blood pressure remains low at 100/60 and has not allowed up titration of the medicine. Unfortunately he is not yet established a coverage from the provider so cost of the medicine is is not affordable. We'll provide him with more samples today however he does need to contact patient assistance. He's also on bisoprolol and aspirin. He denies any shortness of breath and has not had any congestive symptoms and therefore he is not currently on any diuretic other than the sacubitril.  I saw Phillip Solis back in the office today for follow-up. He reports he is breathing somewhat better. He is compliant with his medications although it's not clear yet whether or not he is been established to receive Entresto other than with her samples. Blood pressure remained stable in the low 100s. He is currently on the 24/26 mg dose of Entresto. A repeat echo recently shows an improvement in EF up to 45%. LV filling pressures remain  elevated. He still gets some shortness of breath with moderate exertion but he reports this is improved. He says that he is urinating more frequently. As mentioned above he has a remote history of PAF and given his new congestive heart failure, his CHADSVASC score is now 1 (there is no history of hypertension and he is less than age 40). He has not had any recent recurrence of atrial fibrillation therefore were maintaining him on low-dose aspirin.  PMHx:  Past Medical History  Diagnosis Date  . History of atrial fibrillation     in setting of endoscopy in 2011; resolved  . History of palpitations     Past Surgical History  Procedure Laterality Date  . Left and right heart catheterization with coronary angiogram N/A 07/23/2014    Procedure: LEFT AND RIGHT HEART CATHETERIZATION WITH CORONARY ANGIOGRAM;  Surgeon: [name], MD;  Location: [facility] CATH LAB;  Service: Cardiovascular;  Laterality: N/A;    FAMHx:  Family History  Problem Relation Age of Onset  . Heart Problems Mother   . Cancer Father     SOCHx:   reports that he quit smoking about 42 years ago. His smokeless tobacco use includes Chew. He reports that he drinks alcohol. He reports that he does not use illicit  drugs.  ALLERGIES:  No Known Allergies  ROS: A comprehensive review of systems was negative except for: Respiratory: positive for dyspnea on exertion  HOME MEDS: Current Outpatient Prescriptions  Medication Sig Dispense Refill  . aspirin 325 MG tablet Take 325 mg by mouth daily.    . bisoprolol (ZEBETA) 5 MG tablet Take 1 tablet (5 mg total) by mouth 2 (two) times daily. 180 tablet 3  . Multiple Vitamin (MULTIVITAMIN WITH MINERALS) TABS tablet Take 1 tablet by mouth daily.    . Niacin, Antihyperlipidemic, (NIASPAN PO) Take 1 tablet by mouth daily.    . sacubitril-valsartan (ENTRESTO) 49-51 MG Take 1 tablet by mouth 2 (two) times daily. 56 tablet 0   No current facility-administered medications for this visit.      LABS/IMAGING: No results found for this or any previous visit (from the past 48 hour(s)). No results found.  VITALS: BP 104/70 mmHg  Pulse 71  Ht '5\' 7"'$  (1.702 m)  Wt 164 lb 11.2 oz (74.707 kg)  BMI 25.79 kg/m2  EXAM: General appearance: alert and no distress Neck: no carotid bruit and no JVD Lungs: clear to auscultation bilaterally Heart: regular rate and rhythm, S1, S2 normal, no murmur, click, rub or gallop Abdomen: soft, non-tender; bowel sounds normal; no masses,  no organomegaly Extremities: extremities normal, atraumatic, no cyanosis or edema Pulses: 2+ and symmetric Skin: Skin color, texture, turgor normal. 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   Phillip Solis reports mild shortness of breath with exertion. He is significantly improved with the addition of Entresto. I like to increase the dose up to 49/51 mg. Blood pressure is been stable although it is in the low 100s, I believe he will tolerate it. Filling pressures on his echo are still elevated. He has not had any recurrent atrial fibrillation for some time. His CHADSVASC score is now 1 based on congestive heart failure, but there is still not indication for more anticoagulation and aspirin in my opinion. Should he develop recurrent A. fib or other risk factors that would increase his risk for stroke, we need to consider more potent anticoagulation. I'll plan to establish a follow-up with Erasmo Downer our pharmacist in 2-3 weeks to reassess blood pressure and tolerance of the increased dose of Entresto. Otherwise he'll follow-up with me in 6 months.  [name], MD, [facility] Attending  Cardiologist Oilton C Hilty 05/21/2015, 9:11 AM

## 2015-05-21 NOTE — Patient Instructions (Addendum)
Your physician has recommended you make the following change in your medication: INCREASE ENTRESTO TO 49/51 MG TABLETS TWICE DAILY.  SAMPLES GIVEN  Your physician recommends that you schedule a follow-up appointment in: 2-3 [place], PHARM-D  Your physician recommends that you schedule a follow-up appointment in: [place] DR. HILTY

## 2015-06-05 ENCOUNTER — Encounter: Payer: Self-pay | Admitting: Pharmacist Clinician (PhC)/ Clinical Pharmacy Specialist

## 2015-06-05 ENCOUNTER — Ambulatory Visit (INDEPENDENT_AMBULATORY_CARE_PROVIDER_SITE_OTHER): Payer: Medicare HMO | Admitting: Pharmacist Clinician (PhC)/ Clinical Pharmacy Specialist

## 2015-06-05 VITALS — BP 106/78 | HR 68 | Ht 67.0 in | Wt 164.8 lb

## 2015-06-05 DIAGNOSIS — I420 Dilated cardiomyopathy: Secondary | ICD-10-CM

## 2015-06-05 DIAGNOSIS — I429 Cardiomyopathy, unspecified: Secondary | ICD-10-CM

## 2015-06-05 NOTE — Patient Instructions (Signed)
Continue with the Entresto 49/51 mg twice daily.    Contact the [employer] at [phone] or [email or web address].  They should be able to help you get coverage for you copay.   When you contact them you will need information about your insurance (your [employer] card), and your income level (have your social security statement 2015 tax form.    Physician information:  Dr. Kenneth Mali Hilty MD    [facility] [place]    [place], [place] [address]    [phone]

## 2015-06-05 NOTE — Progress Notes (Signed)
     06/05/2015 KYLAND NO 1946/04/12 [id number]   HPI:  GERMAN MANKE is a 70 y.o. male patient of Dr Debara Pickett, with a PMH below who presents today for Entresto dose titration.  Mr. Cavanah was titrated from the 24/26 mg to 49/51 mg in early January and states he is feeling better these days.  He is staying active in his retirement, working the yard and around the house.  His [name] was approved by insurance, however the copay is over $400 per month.  I don't know if that includes some deductible that needs to be met, but regardless, it is out of his financial reach.  He has been given samples by our office and has enough for another few weeks.  He still complains of some SOB and frequent urination, but otherwise has no complaints today.    Cardiac Hx: non-ischemic cardiomyopathy, AF (CHADS2-VASc score is 2 - only on aspirin)  Social Hx: quit smoking 30+ years ago, does admit to occasional chewing tobacco; some alcohol  Current Entresto dose: 49/51 mg twice daily   Wt Readings from Last 3 Encounters:  06/05/15 164 lb 12.8 oz (74.753 kg)  05/21/15 164 lb 11.2 oz (74.707 kg)  11/20/14 161 lb (73.029 kg)   BP Readings from Last 3 Encounters:  06/05/15 106/78  05/21/15 104/70  11/20/14 100/60   Pulse Readings from Last 3 Encounters:  06/05/15 68  05/21/15 71  11/20/14 72    Current Outpatient Prescriptions  Medication Sig Dispense Refill  . aspirin EC 81 MG tablet Take 1 tablet (81 mg total) by mouth daily. 90 tablet 3  . bisoprolol (ZEBETA) 5 MG tablet Take 1 tablet (5 mg total) by mouth 2 (two) times daily. 180 tablet 3  . Multiple Vitamin (MULTIVITAMIN WITH MINERALS) TABS tablet Take 1 tablet by mouth daily.    . Niacin, Antihyperlipidemic, (NIASPAN PO) Take 1 tablet by mouth daily.    . sacubitril-valsartan (ENTRESTO) 49-51 MG Take 1 tablet by mouth 2 (two) times daily. 56 tablet 0   No current facility-administered medications for this visit.    No Known  Allergies  Past Medical History  Diagnosis Date  . History of atrial fibrillation     in setting of endoscopy in 2011; resolved  . History of palpitations     Blood pressure 106/78, pulse 68, height '5\' 7"'$  (1.702 m), weight 164 lb 12.8 oz (74.753 kg).    Tommy Medal PharmD CPP [facility]

## 2015-06-05 NOTE — Assessment & Plan Note (Signed)
Today his blood pressure in the office is 106/78.  He reports no symptoms of orthostatic hypotension, and standing in the office his pressure was 102/68.  Because of this blood pressure I am going to have him continue on the current dose of Entresto.  I think that increasing the dose would push his systolic pressure to < 233 and at his age I don't think this is advisable.   He was again given the phone and internet information to access the [employer].  He states his wife likes to "play" on the internet, so I encouraged them to apply online as it would be faster.  In his discharge information I listed the information he will need for them, including income and insurance information, as well as listed Dr. Lysbeth Penner name and address.  He will have to do this portion on his own, we cannot register [employer] requests from our office.

## 2015-07-11 ENCOUNTER — Emergency Department (HOSPITAL_COMMUNITY): Payer: Medicare HMO

## 2015-07-11 ENCOUNTER — Inpatient Hospital Stay (HOSPITAL_COMMUNITY)
Admission: EM | Admit: 2015-07-11 | Discharge: 2015-07-18 | DRG: 023 | Disposition: A | Discharge status: Home Health | Payer: Medicare HMO | Attending: Neurology | Admitting: Neurology

## 2015-07-11 ENCOUNTER — Encounter (HOSPITAL_COMMUNITY)
Admission: EM | Disposition: A | Discharge status: Home Health | Payer: Self-pay | Source: Home / Self Care | Attending: Neurology

## 2015-07-11 ENCOUNTER — Encounter (HOSPITAL_COMMUNITY): Payer: Self-pay

## 2015-07-11 ENCOUNTER — Emergency Department (HOSPITAL_COMMUNITY): Payer: Medicare HMO | Admitting: Anesthesiology

## 2015-07-11 DIAGNOSIS — I69391 Dysphagia following cerebral infarction: Secondary | ICD-10-CM

## 2015-07-11 DIAGNOSIS — I959 Hypotension, unspecified: Secondary | ICD-10-CM | POA: Diagnosis not present

## 2015-07-11 DIAGNOSIS — R4701 Aphasia: Secondary | ICD-10-CM | POA: Diagnosis present

## 2015-07-11 DIAGNOSIS — I63412 Cerebral infarction due to embolism of left middle cerebral artery: Secondary | ICD-10-CM | POA: Diagnosis not present

## 2015-07-11 DIAGNOSIS — R402413 Glasgow coma scale score 13-15, at hospital admission: Secondary | ICD-10-CM | POA: Diagnosis present

## 2015-07-11 DIAGNOSIS — I639 Cerebral infarction, unspecified: Secondary | ICD-10-CM | POA: Diagnosis present

## 2015-07-11 DIAGNOSIS — J95821 Acute postprocedural respiratory failure: Secondary | ICD-10-CM | POA: Diagnosis not present

## 2015-07-11 DIAGNOSIS — R414 Neurologic neglect syndrome: Secondary | ICD-10-CM | POA: Diagnosis present

## 2015-07-11 DIAGNOSIS — I5020 Unspecified systolic (congestive) heart failure: Secondary | ICD-10-CM | POA: Diagnosis present

## 2015-07-11 DIAGNOSIS — R1310 Dysphagia, unspecified: Secondary | ICD-10-CM | POA: Diagnosis present

## 2015-07-11 DIAGNOSIS — I6789 Other cerebrovascular disease: Secondary | ICD-10-CM | POA: Diagnosis not present

## 2015-07-11 DIAGNOSIS — R29810 Facial weakness: Secondary | ICD-10-CM | POA: Diagnosis present

## 2015-07-11 DIAGNOSIS — J9601 Acute respiratory failure with hypoxia: Secondary | ICD-10-CM | POA: Insufficient documentation

## 2015-07-11 DIAGNOSIS — I4891 Unspecified atrial fibrillation: Secondary | ICD-10-CM | POA: Diagnosis present

## 2015-07-11 DIAGNOSIS — I5022 Chronic systolic (congestive) heart failure: Secondary | ICD-10-CM | POA: Diagnosis not present

## 2015-07-11 DIAGNOSIS — T462X5A Adverse effect of other antidysrhythmic drugs, initial encounter: Secondary | ICD-10-CM | POA: Diagnosis not present

## 2015-07-11 DIAGNOSIS — J9690 Respiratory failure, unspecified, unspecified whether with hypoxia or hypercapnia: Secondary | ICD-10-CM

## 2015-07-11 DIAGNOSIS — Z01818 Encounter for other preprocedural examination: Secondary | ICD-10-CM

## 2015-07-11 DIAGNOSIS — I69320 Aphasia following cerebral infarction: Secondary | ICD-10-CM | POA: Insufficient documentation

## 2015-07-11 DIAGNOSIS — R29721 NIHSS score 21: Secondary | ICD-10-CM | POA: Diagnosis present

## 2015-07-11 DIAGNOSIS — D62 Acute posthemorrhagic anemia: Secondary | ICD-10-CM | POA: Insufficient documentation

## 2015-07-11 DIAGNOSIS — Z8673 Personal history of transient ischemic attack (TIA), and cerebral infarction without residual deficits: Secondary | ICD-10-CM | POA: Diagnosis not present

## 2015-07-11 DIAGNOSIS — F17220 Nicotine dependence, chewing tobacco, uncomplicated: Secondary | ICD-10-CM | POA: Diagnosis present

## 2015-07-11 DIAGNOSIS — J984 Other disorders of lung: Secondary | ICD-10-CM | POA: Diagnosis not present

## 2015-07-11 DIAGNOSIS — I480 Paroxysmal atrial fibrillation: Secondary | ICD-10-CM | POA: Diagnosis present

## 2015-07-11 DIAGNOSIS — Z4659 Encounter for fitting and adjustment of other gastrointestinal appliance and device: Secondary | ICD-10-CM

## 2015-07-11 DIAGNOSIS — J811 Chronic pulmonary edema: Secondary | ICD-10-CM

## 2015-07-11 DIAGNOSIS — E119 Type 2 diabetes mellitus without complications: Secondary | ICD-10-CM | POA: Diagnosis present

## 2015-07-11 DIAGNOSIS — Z7982 Long term (current) use of aspirin: Secondary | ICD-10-CM | POA: Diagnosis not present

## 2015-07-11 DIAGNOSIS — R000 Tachycardia, unspecified: Secondary | ICD-10-CM | POA: Insufficient documentation

## 2015-07-11 DIAGNOSIS — I69359 Hemiplegia and hemiparesis following cerebral infarction affecting unspecified side: Secondary | ICD-10-CM

## 2015-07-11 DIAGNOSIS — G8191 Hemiplegia, unspecified affecting right dominant side: Secondary | ICD-10-CM | POA: Diagnosis present

## 2015-07-11 DIAGNOSIS — R0682 Tachypnea, not elsewhere classified: Secondary | ICD-10-CM | POA: Insufficient documentation

## 2015-07-11 DIAGNOSIS — I5042 Chronic combined systolic (congestive) and diastolic (congestive) heart failure: Secondary | ICD-10-CM | POA: Insufficient documentation

## 2015-07-11 DIAGNOSIS — D696 Thrombocytopenia, unspecified: Secondary | ICD-10-CM | POA: Insufficient documentation

## 2015-07-11 HISTORY — DX: Unspecified atrial fibrillation: I48.91

## 2015-07-11 HISTORY — PX: RADIOLOGY WITH ANESTHESIA: SHX6223

## 2015-07-11 LAB — I-STAT CHEM 8, ED
BUN: 19 mg/dL (ref 6–20)
CALCIUM ION: 1.08 mmol/L — AB (ref 1.13–1.30)
CHLORIDE: 106 mmol/L (ref 101–111)
Creatinine, Ser: 1 mg/dL (ref 0.61–1.24)
Glucose, Bld: 130 mg/dL — ABNORMAL HIGH (ref 65–99)
HCT: 42 % (ref 39.0–52.0)
Hemoglobin: 14.3 g/dL (ref 13.0–17.0)
Potassium: 4.2 mmol/L (ref 3.5–5.1)
Sodium: 140 mmol/L (ref 135–145)
TCO2: 22 mmol/L (ref 0–100)

## 2015-07-11 LAB — COMPREHENSIVE METABOLIC PANEL
ALT: 14 U/L — ABNORMAL LOW (ref 17–63)
ANION GAP: 11 (ref 5–15)
AST: 22 U/L (ref 15–41)
Albumin: 3.3 g/dL — ABNORMAL LOW (ref 3.5–5.0)
Alkaline Phosphatase: 62 U/L (ref 38–126)
BILIRUBIN TOTAL: 0.4 mg/dL (ref 0.3–1.2)
BUN: 18 mg/dL (ref 6–20)
CHLORIDE: 105 mmol/L (ref 101–111)
CO2: 21 mmol/L — ABNORMAL LOW (ref 22–32)
Calcium: 8.5 mg/dL — ABNORMAL LOW (ref 8.9–10.3)
Creatinine, Ser: 1.04 mg/dL (ref 0.61–1.24)
Glucose, Bld: 134 mg/dL — ABNORMAL HIGH (ref 65–99)
POTASSIUM: 4.2 mmol/L (ref 3.5–5.1)
Sodium: 137 mmol/L (ref 135–145)
TOTAL PROTEIN: 5.9 g/dL — AB (ref 6.5–8.1)

## 2015-07-11 LAB — RAPID URINE DRUG SCREEN, HOSP PERFORMED
Amphetamines: NOT DETECTED
BENZODIAZEPINES: NOT DETECTED
Barbiturates: NOT DETECTED
COCAINE: NOT DETECTED
OPIATES: NOT DETECTED
Tetrahydrocannabinol: NOT DETECTED

## 2015-07-11 LAB — CBC
HEMATOCRIT: 40.4 % (ref 39.0–52.0)
Hemoglobin: 13.8 g/dL (ref 13.0–17.0)
MCH: 31 pg (ref 26.0–34.0)
MCHC: 34.2 g/dL (ref 30.0–36.0)
MCV: 90.8 fL (ref 78.0–100.0)
Platelets: 178 10*3/uL (ref 150–400)
RBC: 4.45 MIL/uL (ref 4.22–5.81)
RDW: 13.9 % (ref 11.5–15.5)
WBC: 7.1 10*3/uL (ref 4.0–10.5)

## 2015-07-11 LAB — URINALYSIS, ROUTINE W REFLEX MICROSCOPIC
Bilirubin Urine: NEGATIVE
GLUCOSE, UA: NEGATIVE mg/dL
HGB URINE DIPSTICK: NEGATIVE
Ketones, ur: NEGATIVE mg/dL
LEUKOCYTES UA: NEGATIVE
Nitrite: NEGATIVE
PROTEIN: NEGATIVE mg/dL
SPECIFIC GRAVITY, URINE: 1.024 (ref 1.005–1.030)
pH: 5.5 (ref 5.0–8.0)

## 2015-07-11 LAB — PROTIME-INR
INR: 0.96 (ref 0.00–1.49)
Prothrombin Time: 12.9 seconds (ref 11.6–15.2)

## 2015-07-11 LAB — DIFFERENTIAL
Basophils Absolute: 0 10*3/uL (ref 0.0–0.1)
Basophils Relative: 0 %
Eosinophils Absolute: 0.3 10*3/uL (ref 0.0–0.7)
Eosinophils Relative: 4 %
LYMPHS PCT: 50 %
Lymphs Abs: 3.5 10*3/uL (ref 0.7–4.0)
MONO ABS: 0.5 10*3/uL (ref 0.1–1.0)
MONOS PCT: 7 %
Neutro Abs: 2.8 10*3/uL (ref 1.7–7.7)
Neutrophils Relative %: 39 %

## 2015-07-11 LAB — I-STAT TROPONIN, ED: TROPONIN I, POC: 0 ng/mL (ref 0.00–0.08)

## 2015-07-11 LAB — APTT: aPTT: 25 seconds (ref 24–37)

## 2015-07-11 LAB — ETHANOL: Alcohol, Ethyl (B): <5 mg/dL (ref <5)

## 2015-07-11 LAB — CBG MONITORING, ED: Glucose-Capillary: 100 mg/dL — ABNORMAL HIGH (ref 65–99)

## 2015-07-11 SURGERY — RADIOLOGY WITH ANESTHESIA
Anesthesia: General

## 2015-07-11 MED ORDER — ALTEPLASE (STROKE) FULL DOSE INFUSION: 0.9000 mg/kg | Freq: Once | INTRAVENOUS | Status: DC

## 2015-07-11 MED ORDER — IOHEXOL 300 MG/ML  SOLN
150.0000 mL | Freq: Once | INTRAMUSCULAR | Status: AC | PRN
  Administered 2015-07-11: 90 mL via INTRAVENOUS

## 2015-07-11 MED ORDER — SODIUM CHLORIDE 0.9 % IV BOLUS (SEPSIS)
500.0000 mL | Freq: Once | INTRAVENOUS | Status: AC
Start: 2015-07-11 — End: 2015-07-11
  Administered 2015-07-11: 500 mL via INTRAVENOUS

## 2015-07-11 MED ORDER — STROKE: EARLY STAGES OF RECOVERY BOOK
Freq: Once | Status: AC
  Filled 2015-07-11: qty 1

## 2015-07-11 MED ORDER — LACTATED RINGERS IV SOLN
INTRAVENOUS | Status: DC | PRN
  Administered 2015-07-11: 23:00:00 via INTRAVENOUS

## 2015-07-11 MED ORDER — SENNOSIDES-DOCUSATE SODIUM 8.6-50 MG PO TABS: 1.0000 | ORAL_TABLET | Freq: Every evening | ORAL | Status: DC | PRN

## 2015-07-11 MED ORDER — ALTEPLASE (STROKE) FULL DOSE INFUSION
0.9000 mg/kg | Freq: Once | INTRAVENOUS | Status: AC
  Administered 2015-07-11: 71 mg via INTRAVENOUS
  Filled 2015-07-11: qty 100

## 2015-07-11 MED ORDER — ALTEPLASE 30 MG/30 ML FOR INTERV. RAD
1.0000 mg | INTRA_ARTERIAL | Status: AC | PRN
  Administered 2015-07-12: 5 mg via INTRA_ARTERIAL
  Filled 2015-07-11: qty 30

## 2015-07-11 MED ORDER — LIDOCAINE HCL 1 % IJ SOLN
INTRAMUSCULAR | Status: AC
  Administered 2015-07-12: 10 mL via INTRADERMAL
  Filled 2015-07-11: qty 20

## 2015-07-11 MED ORDER — NOREPINEPHRINE BITARTRATE 1 MG/ML IV SOLN: 4000.0000 ug | INTRAVENOUS | Status: DC | PRN

## 2015-07-11 MED ORDER — IOHEXOL 350 MG/ML SOLN
50.0000 mL | Freq: Once | INTRAVENOUS | Status: AC | PRN
  Administered 2015-07-11: 50 mL via INTRAVENOUS

## 2015-07-11 MED ORDER — ROCURONIUM BROMIDE 100 MG/10ML IV SOLN
INTRAVENOUS | Status: DC | PRN
  Administered 2015-07-11 – 2015-07-12 (×2): 50 mg via INTRAVENOUS

## 2015-07-11 MED ORDER — NITROGLYCERIN 1 MG/10 ML FOR IR/CATH LAB
INTRA_ARTERIAL | Status: AC
  Administered 2015-07-12 (×4): 25 ug
  Filled 2015-07-11: qty 10

## 2015-07-11 MED ORDER — LIDOCAINE HCL (CARDIAC) 20 MG/ML IV SOLN
INTRAVENOUS | Status: DC | PRN
  Administered 2015-07-11: 60 mg via INTRATRACHEAL

## 2015-07-11 MED ORDER — SUCCINYLCHOLINE CHLORIDE 20 MG/ML IJ SOLN
INTRAMUSCULAR | Status: DC | PRN
  Administered 2015-07-11: 100 mg via INTRAVENOUS

## 2015-07-11 MED ORDER — PROPOFOL 10 MG/ML IV BOLUS
INTRAVENOUS | Status: DC | PRN
  Administered 2015-07-11: 100 mg via INTRAVENOUS

## 2015-07-11 MED ORDER — SODIUM CHLORIDE 0.9 % IV SOLN: 50.0000 mL | Freq: Once | INTRAVENOUS | Status: DC

## 2015-07-11 NOTE — Consult Note (Signed)
PULMONARY  / CRITICAL CARE MEDICINE CONSULTATION   Name: Phillip Solis MRN: [id number] DOB: [birth year]    ADMISSION DATE:  07/11/2015 CONSULTATION DATE: July 11, 2015  REQUESTING CLINICIAN: Greta Doom, MD PRIMARY SERVICE: [facility]  CHIEF COMPLAINT:  Altered Mental Status  BRIEF PATIENT DESCRIPTION: 70 y/o man with hx of CHF presenting with ischemic stroke going for mechanical clot retrieval.  SIGNIFICANT EVENTS / STUDIES:  - IR directed mechanical clot retrieval  LINES / TUBES: - 7.5 m OETT - PIV x2 - 16 Fr urinary catheter  CULTURES: None  ANTIBIOTICS: None  HISTORY OF PRESENT ILLNESS:   Phillip Solis is a 70 y/o man with a history of CHF (EF 35-40%) on Entresto who presented with an acute stroke, with onset of symptoms at 9:00. He was treated with IV tPA and taken for clot retrieval. PCCM was called for vent management post-procedure.  PAST MEDICAL HISTORY :  Past Medical History  Diagnosis Date  . History of atrial fibrillation     in setting of endoscopy in 2011; resolved  . History of palpitations    Past Surgical History  Procedure Laterality Date  . Left and right heart catheterization with coronary angiogram N/A 07/23/2014    Procedure: LEFT AND RIGHT HEART CATHETERIZATION WITH CORONARY ANGIOGRAM;  Surgeon: [name], MD;  Location: [facility] CATH LAB;  Service: Cardiovascular;  Laterality: N/A;   Prior to Admission medications   Medication Sig Start Date End Date Taking? Authorizing Provider  aspirin EC 81 MG tablet Take 1 tablet (81 mg total) by mouth daily. 05/21/15  Yes [name], MD  bisoprolol (ZEBETA) 5 MG tablet Take 1 tablet (5 mg total) by mouth 2 (two) times daily. 05/21/15  Yes [name], MD  Multiple Vitamin (MULTIVITAMIN WITH MINERALS) TABS tablet Take 1 tablet by mouth daily.   Yes Historical Provider, MD  Niacin, Antihyperlipidemic, (NIASPAN PO) Take 1 tablet by mouth daily.   Yes Historical Provider, MD   No Known  Allergies  FAMILY HISTORY:  Family History  Problem Relation Age of Onset  . Heart Problems Mother   . Cancer Father    SOCIAL HISTORY:  reports that he quit smoking about 42 years ago. His smokeless tobacco use includes Chew. He reports that he drinks alcohol. He reports that he does not use illicit drugs.  REVIEW OF SYSTEMS:  Unable to assess 2/2 intubation  SUBJECTIVE:   VITAL SIGNS: Temp:  [95.2 F (35.1 C)-98 F (36.7 C)] 97.5 F (36.4 C) (03/03 2255) Pulse Rate:  [78-84] 79 (03/03 2255) Resp:  [14-22] 19 (03/03 2255) BP: (124-137)/(82-110) 124/82 mmHg (03/03 2255) SpO2:  [98 %-100 %] 99 % (03/03 2255) HEMODYNAMICS:   VENTILATOR SETTINGS:   INTAKE / OUTPUT: Intake/Output    None     PHYSICAL EXAMINATION: General:  (prior to intubation): Elderly man in NAD Neuro:  Dense R hemiparasis, Expressive aphasia. HEENT:  MMM Neck: No LAD Cardiovascular:  Heart sounds dual and normal. Regular rate. Lungs: Clear to auscultation. Abdomen:  Soft Musculoskeletal:  No swollen joints Skin:  No rashes.  LABS:  CBC  Recent Labs Lab 07/11/15 2152 07/11/15 2208  WBC 7.1  --   HGB 13.8 14.3  HCT 40.4 42.0  PLT 178  --    Coag's  Recent Labs Lab 07/11/15 2152  APTT 25  INR 0.96   BMET  Recent Labs Lab 07/11/15 2152 07/11/15 2208  NA 137 140  K 4.2 4.2  CL 105  106  CO2 21*  --   BUN 18 19  CREATININE 1.04 1.00  GLUCOSE 134* 130*   Electrolytes  Recent Labs Lab 07/11/15 2152  CALCIUM 8.5*   Sepsis Markers No results for input(s): LATICACIDVEN, PROCALCITON, O2SATVEN in the last 168 hours. ABG No results for input(s): PHART, PCO2ART, PO2ART in the last 168 hours. Liver Enzymes  Recent Labs Lab 07/11/15 2152  AST 22  ALT 14*  ALKPHOS 62  BILITOT 0.4  ALBUMIN 3.3*   Cardiac Enzymes No results for input(s): TROPONINI, PROBNP in the last 168 hours. Glucose  Recent Labs Lab 07/11/15 2223  GLUCAP 100*    Imaging Ct Head Wo  Contrast  07/11/2015  CLINICAL DATA:  Code stroke. Right-sided facial droop and slurred speech. Uneven pupils. Initial encounter. EXAM: CT HEAD WITHOUT CONTRAST TECHNIQUE: Contiguous axial images were obtained from the base of the skull through the vertex without intravenous contrast. COMPARISON:  CT of the head performed 10-31-202016 FINDINGS: There is no evidence of acute infarction, mass lesion, or intra- or extra-axial hemorrhage on CT. Prominence of the ventricles and sulci suggests mild cortical volume loss. Scattered subcortical and periventricular white matter change likely reflects small vessel ischemic microangiopathy, relatively stable from the prior study. Mild cerebellar atrophy is noted. The brainstem and fourth ventricle are within normal limits. The basal ganglia are unremarkable in appearance. The cerebral hemispheres demonstrate grossly normal gray-white differentiation. No mass effect or midline shift is seen. There is no evidence of fracture; chronic scattered calvarial lucencies are again noted at the vertex, relatively stable in appearance. The orbits are within normal limits. The paranasal sinuses and mastoid air cells are well-aerated. No significant soft tissue abnormalities are seen. IMPRESSION: 1. No acute intracranial pathology seen on CT. 2. Mild cortical volume loss and scattered small vessel ischemic microangiopathy. These results were called by telephone at the time of interpretation on 07/11/2015 at 10:18 pm to Dr. Leonel Ramsay, who verbally acknowledged these results. Electronically Signed   By: Garald Balding M.D.   On: 07/11/2015 22:21   Ct Angio Neck W/cm &/or Wo/cm  07/11/2015  CLINICAL DATA:  Code stroke, RIGHT facial droop. Slurred speech and uneven pupils. History of atrial fibrillation. EXAM: CT ANGIOGRAPHY HEAD TECHNIQUE: Multidetector CT imaging of the head was performed using the standard protocol during bolus administration of intravenous contrast. Multiplanar CT image  reconstructions and MIPs were obtained to evaluate the vascular anatomy. CONTRAST:  63m OMNIPAQUE IOHEXOL 350 MG/ML SOLN COMPARISON:  CT head July 11, 2015 at 2205 hours FINDINGS: CT HEAD Mildly dense LEFT MCA with LEFT insular ribbon sign. No intraparenchymal hemorrhage, mass effect, midline shift. Ventricles and sulci are overall normal for patient's age, cavum septum pellucidum is a normal variant. Subcentimeter density RIGHT parasagittal parietal lobe suggests developmental venous anomaly. No abnormal extra-axial fluid collections. Basal cisterns are patent. Ocular globes and orbital contents are normal. Paranasal sinuses and mastoid air cells are well aerated. No skull fracture. Multiple prominent vascular lakes at the convexity. Patient is edentulous. CTA NECK Aortic arch: Normal appearance of the thoracic arch, normal branch pattern. Mild calcific atherosclerosis of the aortic arch. The origins of the innominate, left Common carotid artery and subclavian artery are widely patent. Right carotid system: Common carotid artery is widely patent, coursing in a straight line fashion. Normal appearance of the carotid bifurcation without hemodynamically significant stenosis by NASCET criteria. Normal appearance of the included internal carotid artery. Left carotid system: Common carotid artery is widely patent, coursing in a straight  line fashion. Normal appearance of the carotid bifurcation without hemodynamically significant stenosis by NASCET criteria. Mild eccentric calcific atherosclerosis. Normal appearance of the included internal carotid artery. Vertebral arteries:Left vertebral artery is dominant. Normal appearance of the vertebral arteries, which appear widely patent. Skeleton: No acute osseous process though bone windows have not been submitted. Moderate to severe multilevel degenerative discs resulting in moderate to severe neural foraminal narrowing C3-4, C4-5. Old mild T2 compression fracture with  scoliosis. No destructive bony lesions. Other neck: Soft tissues of the neck are nonacute though, not tailored for evaluation. CTA HEAD Anterior circulation: Normal appearance of the cervical internal carotid arteries, petrous, cavernous and supra clinoid internal carotid arteries. Widely patent anterior communicating artery. LEFT A1 is developmentally dominant. Acute distal LEFT M1 segment occlusion. Minimal collateral present on CTA. RIGHT middle cerebral artery vessels are patent. Posterior circulation: Normal appearance of the vertebral arteries, vertebrobasilar junction and basilar artery, as well as main branch vessels. Normal appearance of the posterior cerebral arteries. Fetal origin RIGHT posterior cerebral artery. No hemodynamically significant stenosis, dissection, luminal irregularity, contrast extravasation or aneurysm within the anterior nor posterior circulation. 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 CTA neck:  Negative. CTA head: Emergent distal LEFT M1 occlusion. 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 Acute findings discussed with and reconfirmed by Dr.MCNEILL [facility] on 07/11/2015 at 10:23 pm. Electronically Signed   By: Elon Alas M.D.   On: 07/11/2015 23:00    EKG: NSR CXR: ETT about 2 cm for carina  ASSESSMENT / PLAN:  PULMONARY A: - Need for mechanical ventilation  P:   Will maintain PRVC, SBT in AM  CARDIOVASCULAR A: - CHF P:   - BP Control - No on diuretics, will monitor volume status  RENAL A: - No active issues P:    GASTROINTESTINAL A: - No active issues - NPO, consider  P:    - Will need NGT if fails extubation or swallow study.  HEMATOLOGIC A: - No active issues P:    INFECTIOUS A: - No active issues P:    ENDOCRINE A: - DM2 P:   Will monitor glucose  NEUROLOGIC A: - Acute Ischemic Stroke s/p tPA and catheter-directed  clot retrieval. P:    TODAY'S SUMMARY: 70 y/o man with acute, ischemic stroke  I have personally obtained a history, examined the patient, evaluated laboratory and imaging results, formulated the assessment and plan and placed orders.  CRITICAL CARE: The patient is critically ill with multiple organ systems failure and requires high complexity decision making for assessment and support, frequent evaluation and titration of therapies, application of advanced monitoring technologies and extensive interpretation of multiple databases. Critical Care Time devoted to patient care services described in this note is 60 minutes.   Luz Brazen, MD Pulmonary [facility] [place] Pager: [phone]   07/11/2015, 11:43 PM

## 2015-07-11 NOTE — Progress Notes (Signed)
Code Stroke called on 70 y.o male. LSN 2100 per wife. Sudden onset at home right side weakness, right side droop, slurred speech. EMS called and Pt brought to [facility]. Labs drawn and Patient examined per EDP upon arrival. CBG 100. NIHSS 21, see neuro flow sheet for specifics.  CT and CTA completed STAT. CT negative for bleed, CTA reviewed per neurologist Dr. Leonel Ramsay for potential intervention.  Pertinent history includes resolved Afib (not on Anticoagulants) and CHF.  TPA mixed and started at 2225, IR notified. Pt taken to IR at 2310. For ICU admit following procedure.

## 2015-07-11 NOTE — H&P (Addendum)
Neurology H&P  CC: Right-sided weakness  History is obtained from: Wife  HPI: Phillip Solis is a 70 y.o. male who was in his normal state of health when his wife was getting around 59 PM. She then went to lay down, and then heard a thump. She went to check on him and found him with right-sided weakness and difficulty speaking. EMS was called and code stroke was activated. Following head CT with no signs of hemorrhage, CT angiogram was performed given the signs of a likely large vessel ischemic stroke. IV TPA was initiated and interventional radiology was notified.   As recently diagnosed with CHF, with an EF 35-40%. He also has a history of atrial fibrillation and a single time after an endoscopy with no recurrence. Given a low chads-Vasc score, he was not started on anticoagulation. Though his recent diagnosis of CHF did raise it to 1, given that he did not have any recurrence in the interim since 2011 and it was only in the setting of a procedure he was not started on anticoagulation.    LKW: 9 PM tpa given?: Yes  NIH: 21 Premorbid modified Rankin: 1  ROS:  Unable to obtain due to altered mental status.   Past Medical History  Diagnosis Date  . History of atrial fibrillation     in setting of endoscopy in 2011; resolved  . History of palpitations      Family History  Problem Relation Age of Onset  . Heart Problems Mother   . Cancer Father      Social History:  reports that he quit smoking about 42 years ago. His smokeless tobacco use includes Chew. He reports that he drinks alcohol. He reports that he does not use illicit drugs.   Exam: Current vital signs: BP 124/82 mmHg  Pulse 79  Temp(Src) 97.5 F (36.4 C) (Oral)  Resp 19  SpO2 99% Vital signs in last 24 hours: Temp:  [95.2 F (35.1 C)-98 F (36.7 C)] 97.5 F (36.4 C) (03/03 2255) Pulse Rate:  [78-84] 79 (03/03 2255) Resp:  [14-22] 19 (03/03 2255) BP: (124-137)/(82-110) 124/82 mmHg (03/03 2255) SpO2:  [98  %-100 %] 99 % (03/03 2255)   Physical Exam  Constitutional: Appears well-developed and well-nourished.  Psych: Affect appropriate to situation Eyes: No scleral injection HENT: No OP obstrucion Head: Normocephalic.  Cardiovascular: Normal rate and regular rhythm.  Respiratory: Effort normal and breath sounds normal to anterior ascultation GI: Soft.  No distension. There is no tenderness.  Skin: WDI  Neuro: Mental Status: Patient is drowsy, but he does engage with the examiner. he does not follow commands, and speech is so garbled that even if it was words, it would be not understandable Cranial Nerves: II: Does not blink to threat from the right Pupils are equal, round, and reactive to light.   III,IV, VI: Left gaze preference, does not cross midline to the right V: Difficult to assess given lack of speech VII: Facial movement is notable for significant right facial weakness VIII, X, XI, XII: Unable to assess secondary to patient's altered mental status.  Motor: He has 5/5 strength on the left, on the right he has a flaccid paralysis with minimal movement of both the arm and leg  Sensory: He responds to noxious stimulation less on the right than the left  Cerebellar: Unable to assess secondary to patient's altered mental status.    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 Even on the CT angiogram, he still has relatively preserved gray-white junction, and he is within the timeframe for intervention. He has known for this procedure and will be going to the ICU following this.  Recommendations: 1. HgbA1c, fasting lipid panel 2. MRI of the brain without contrast 3. Frequent neuro  checks 4. Echocardiogram 5. Carotid dopplers 6. Prophylactic therapy-none for 24 hours 7. Risk factor modification 8. Telemetry monitoring 9. PT consult, OT consult, Speech consult 10. Hold entresto for now  This patient is critically ill and at significant risk of neurological worsening, death and care requires constant monitoring of vital signs, hemodynamics,respiratory and cardiac monitoring, neurological assessment, discussion with family, other specialists and medical decision making of high complexity. I spent 65 minutes of neurocritical care time  in the care of  this patient.  Roland Rack, MD [facility] [phone]  If 7pm- 7am, please page neurology on call as listed in AMION. 07/11/2015  11:44 PM

## 2015-07-11 NOTE — ED Notes (Addendum)
Pt back to Trauma C. Dr. Leonel Ramsay at the bedside with Dr. Regenia Skeeter and Dr. Leonette Monarch.

## 2015-07-11 NOTE — ED Provider Notes (Signed)
CSN: [id number]     Arrival date & time 07/11/15  2158 History   First MD Initiated Contact with Patient 07/11/15 2203     Chief Complaint  Patient presents with  . Code Stroke    '@EDPCLEARED'$ @ (Consider location/radiation/quality/duration/timing/severity/associated sxs/prior Treatment) Patient is a 70 y.o. male presenting with Acute Neurological Problem.  Cerebrovascular Accident This is a new problem. The current episode started today (Last known normal 2110). The problem occurs constantly. The problem has been unchanged. Nothing aggravates the symptoms. He has tried nothing for the symptoms. The treatment provided no relief.   Level V caveat.  Past Medical History  Diagnosis Date  . History of atrial fibrillation     in setting of endoscopy in 2011; resolved  . History of palpitations    Past Surgical History  Procedure Laterality Date  . Left and right heart catheterization with coronary angiogram N/A 07/23/2014    Procedure: LEFT AND RIGHT HEART CATHETERIZATION WITH CORONARY ANGIOGRAM;  Surgeon: [name], MD;  Location: [facility] CATH LAB;  Service: Cardiovascular;  Laterality: N/A;   Family History  Problem Relation Age of Onset  . Heart Problems Mother   . Cancer Father    Social History  Substance Use Topics  . Smoking status: Former Smoker    Quit date: 03/19/1973  . Smokeless tobacco: Current User    Types: Chew  . Alcohol Use: 0.0 oz/week    0 Standard drinks or equivalent per week     Comment: occasional beer    Review of Systems  Unable to perform ROS: Mental status change      Allergies  Review of patient's allergies indicates no known allergies.  Home Medications   Prior to Admission medications   Medication Sig Start Date End Date Taking? Authorizing Provider  aspirin EC 81 MG tablet Take 1 tablet (81 mg total) by mouth daily. 05/21/15   [name], MD  bisoprolol (ZEBETA) 5 MG tablet Take 1 tablet (5 mg total) by mouth 2 (two) times daily.  05/21/15   [name], MD  Multiple Vitamin (MULTIVITAMIN WITH MINERALS) TABS tablet Take 1 tablet by mouth daily.    Historical Provider, MD  Niacin, Antihyperlipidemic, (NIASPAN PO) Take 1 tablet by mouth daily.    Historical Provider, MD  sacubitril-valsartan (ENTRESTO) 49-51 MG Take 1 tablet by mouth 2 (two) times daily. 05/21/15   [name], MD   BP 129/96 mmHg  Pulse 83  Temp(Src) 98 F (36.7 C) (Oral)  Resp 14  SpO2 100% Physical Exam  Constitutional: He appears well-nourished. No distress.  HENT:  Head: Normocephalic and atraumatic.  Right Ear: External ear normal.  Left Ear: External ear normal.  Eyes: Pupils are equal, round, and reactive to light. Right eye exhibits no discharge. Left eye exhibits no discharge. No scleral icterus.  Neck: Normal range of motion. Neck supple.  Cardiovascular: Normal rate.  Exam reveals no gallop and no friction rub.   No murmur heard. Pulmonary/Chest: Effort normal and breath sounds normal. No stridor. No respiratory distress. He has no wheezes. He has no rales. He exhibits no tenderness.  Abdominal: Soft. He exhibits no distension and no mass. There is no tenderness. There is no rebound and no guarding.  Musculoskeletal: He exhibits no edema or tenderness.  Neurological: He is alert. GCS eye subscore is 4. GCS verbal subscore is 3. GCS motor subscore is 6.  Right-sided neglect. Right facial droop. Right extremity weakness.  Skin: Skin is warm and  dry. No rash noted. He is not diaphoretic. No erythema.    ED Course  Procedures (including critical care time) Labs Review Labs Reviewed  I-STAT CHEM 8, ED - Abnormal; Notable for the following:    Glucose, Bld 130 (*)    Calcium, Ion 1.08 (*)    All other components within normal limits  CBG MONITORING, ED - Abnormal; Notable for the following:    Glucose-Capillary 100 (*)    All other components within normal limits  ETHANOL  PROTIME-INR  APTT  CBC  DIFFERENTIAL   COMPREHENSIVE METABOLIC PANEL  URINE RAPID DRUG SCREEN, HOSP PERFORMED  URINALYSIS, ROUTINE W REFLEX MICROSCOPIC (NOT AT [facility])  I-STAT TROPOININ, ED    Imaging Review Ct Head Wo Contrast  07/11/2015  CLINICAL DATA:  Code stroke. Right-sided facial droop and slurred speech. Uneven pupils. Initial encounter. EXAM: CT HEAD WITHOUT CONTRAST TECHNIQUE: Contiguous axial images were obtained from the base of the skull through the vertex without intravenous contrast. COMPARISON:  CT of the head performed 05/19/202016 FINDINGS: There is no evidence of acute infarction, mass lesion, or intra- or extra-axial hemorrhage on CT. Prominence of the ventricles and sulci suggests mild cortical volume loss. Scattered subcortical and periventricular white matter change likely reflects small vessel ischemic microangiopathy, relatively stable from the prior study. Mild cerebellar atrophy is noted. The brainstem and fourth ventricle are within normal limits. The basal ganglia are unremarkable in appearance. The cerebral hemispheres demonstrate grossly normal gray-white differentiation. No mass effect or midline shift is seen. There is no evidence of fracture; chronic scattered calvarial lucencies are again noted at the vertex, relatively stable in appearance. The orbits are within normal limits. The paranasal sinuses and mastoid air cells are well-aerated. No significant soft tissue abnormalities are seen. IMPRESSION: 1. No acute intracranial pathology seen on CT. 2. Mild cortical volume loss and scattered small vessel ischemic microangiopathy. These results were called by telephone at the time of interpretation on 07/11/2015 at 10:18 pm to Dr. Leonel Ramsay, who verbally acknowledged these results. Electronically Signed   By: Garald Balding M.D.   On: 07/11/2015 22:21   I have personally reviewed and evaluated these images and lab results as part of my medical decision-making.   EKG Interpretation   Date/Time:  Friday July 11 2015 22:22:01 EST Ventricular Rate:  84 PR Interval:  207 QRS Duration: 120 QT Interval:  392 QTC Calculation: 463 R Axis:   3 Text Interpretation:  Sinus rhythm Ventricular premature complex  Incomplete left bundle branch block no significant change since Mar 2016  Confirmed by Regenia Skeeter  MD, SCOTT [phone]) on 07/11/2015 10:32:08 PM      MDM   Code stroke. Right-sided facial droop, slurred speech, right-sided weakness and right-sided neglect. Last known normal 2110. Neurology at bedside for evaluation. Imaging revealed left-sided M1 occlusion. They will proceed with TPA and IR intervention. Patient will be admitted to the neuro ICU for continued management.  Patient remained hemodynamically stable while in the ED and was transferred to IR without competition.  Diagnostic studies were interpreted by me and use to my clinical decision-making.  Patient seen in conjunction with Dr. Regenia Skeeter.  Final diagnoses:  Stroke (cerebrum) [facility])  Stroke (cerebrum) [facility])        Addison Lank, MD 07/11/15 [id number]  Sherwood Gambler, MD 07/12/15 [phone]

## 2015-07-11 NOTE — Anesthesia Preprocedure Evaluation (Signed)
Anesthesia Evaluation  Patient identified by MRN, date of birth, ID bandGeneral Assessment Comment:Acute CVA  Reviewed: Unable to perform ROS - Chart review only  Airway Mallampati: III   Neck ROM: Limited    Dental  (+) Edentulous Upper, Edentulous Lower   Pulmonary shortness of breath, former smoker,     + decreased breath sounds+ wheezing      Cardiovascular + Peripheral Vascular Disease and + DOE  + dysrhythmias Atrial Fibrillation  Rhythm:Regular Rate:Normal  cardiomyopathy   Neuro/Psych CVA    GI/Hepatic   Endo/Other    Renal/GU      Musculoskeletal   Abdominal   Peds  Hematology   Anesthesia Other Findings   Reproductive/Obstetrics                             Anesthesia Physical Anesthesia Plan  ASA: IV and emergent  Anesthesia Plan: General   Post-op Pain Management:    Induction: Intravenous  Airway Management Planned: Oral ETT  Additional Equipment:   Intra-op Plan:   Post-operative Plan: Post-operative intubation/ventilation  Informed Consent: I have reviewed the patients History and Physical, chart, labs and discussed the procedure including the risks, benefits and alternatives for the proposed anesthesia with the patient or authorized representative who has indicated his/her understanding and acceptance.     Plan Discussed with: CRNA  Anesthesia Plan Comments:         Anesthesia Quick Evaluation

## 2015-07-11 NOTE — ED Notes (Signed)
Family at bedside. 

## 2015-07-11 NOTE — Anesthesia Procedure Notes (Signed)
Procedure Name: Intubation Date/Time: 07/11/2015 11:26 PM Performed by: Valetta Fuller Pre-anesthesia Checklist: Patient identified, Emergency Drugs available, Suction available and Patient being monitored Patient Re-evaluated:Patient Re-evaluated prior to inductionOxygen Delivery Method: Circle system utilized Preoxygenation: Pre-oxygenation with 100% oxygen Intubation Type: IV induction, Rapid sequence and Cricoid Pressure applied Laryngoscope Size: Miller and 2 Grade View: Grade II Tube type: Subglottic suction tube Tube size: 7.5 mm Number of attempts: 1 Airway Equipment and Method: Stylet Placement Confirmation: ETT inserted through vocal cords under direct vision,  positive ETCO2 and breath sounds checked- equal and bilateral Secured at: 24 cm Tube secured with: Tape Dental Injury: Teeth and Oropharynx as per pre-operative assessment

## 2015-07-11 NOTE — ED Notes (Signed)
Per [facility], pt from home. Found by wife at 2110 to have slurred speech, right facial droop, right sided weakness, and unequal pupils. Pt is alert and follows commands. On NRB, HR 80's but would drop to 60. BP 130/86. CBG 162. 18g to LAC, 20g to LW.

## 2015-07-12 ENCOUNTER — Inpatient Hospital Stay (HOSPITAL_COMMUNITY): Payer: Medicare HMO

## 2015-07-12 DIAGNOSIS — J9601 Acute respiratory failure with hypoxia: Secondary | ICD-10-CM | POA: Insufficient documentation

## 2015-07-12 DIAGNOSIS — I639 Cerebral infarction, unspecified: Secondary | ICD-10-CM | POA: Diagnosis present

## 2015-07-12 LAB — CBC WITH DIFFERENTIAL/PLATELET
BASOS ABS: 0 10*3/uL (ref 0.0–0.1)
Basophils Relative: 0 %
EOS PCT: 1 %
Eosinophils Absolute: 0.1 10*3/uL (ref 0.0–0.7)
HCT: 35.2 % — ABNORMAL LOW (ref 39.0–52.0)
HEMOGLOBIN: 12.1 g/dL — AB (ref 13.0–17.0)
LYMPHS ABS: 1.5 10*3/uL (ref 0.7–4.0)
Lymphocytes Relative: 18 %
MCH: 30.6 pg (ref 26.0–34.0)
MCHC: 34.4 g/dL (ref 30.0–36.0)
MCV: 88.9 fL (ref 78.0–100.0)
Monocytes Absolute: 0.3 10*3/uL (ref 0.1–1.0)
Monocytes Relative: 4 %
NEUTROS PCT: 77 %
Neutro Abs: 6.3 10*3/uL (ref 1.7–7.7)
PLATELETS: 160 10*3/uL (ref 150–400)
RBC: 3.96 MIL/uL — AB (ref 4.22–5.81)
RDW: 13.9 % (ref 11.5–15.5)
WBC: 8.3 10*3/uL (ref 4.0–10.5)

## 2015-07-12 LAB — BASIC METABOLIC PANEL
ANION GAP: 9 (ref 5–15)
BUN: 13 mg/dL (ref 6–20)
CO2: 19 mmol/L — ABNORMAL LOW (ref 22–32)
Calcium: 8 mg/dL — ABNORMAL LOW (ref 8.9–10.3)
Chloride: 110 mmol/L (ref 101–111)
Creatinine, Ser: 0.88 mg/dL (ref 0.61–1.24)
GFR calc Af Amer: >60 mL/min (ref >60)
GLUCOSE: 106 mg/dL — AB (ref 65–99)
POTASSIUM: 4 mmol/L (ref 3.5–5.1)
Sodium: 138 mmol/L (ref 135–145)

## 2015-07-12 LAB — BLOOD GAS, ARTERIAL
Acid-base deficit: 5 mmol/L — ABNORMAL HIGH (ref 0.0–2.0)
Bicarbonate: 18.6 mEq/L — ABNORMAL LOW (ref 20.0–24.0)
DRAWN BY: 244851
FIO2: 0.4
LHR: 14 {breaths}/min
MECHVT: 600 mL
O2 Saturation: 99.2 %
PATIENT TEMPERATURE: 98.6
PCO2 ART: 28.8 mmHg — AB (ref 35.0–45.0)
PEEP: 5 cmH2O
TCO2: 19.5 mmol/L (ref 0–100)
pH, Arterial: 7.427 (ref 7.350–7.450)
pO2, Arterial: 175 mmHg — ABNORMAL HIGH (ref 80.0–100.0)

## 2015-07-12 LAB — LIPID PANEL
CHOLESTEROL: 182 mg/dL (ref 0–200)
HDL: 21 mg/dL — AB (ref >40)
LDL CALC: 120 mg/dL — AB (ref 0–99)
Total CHOL/HDL Ratio: 8.7 RATIO
Triglycerides: 207 mg/dL — ABNORMAL HIGH (ref <150)
VLDL: 41 mg/dL — AB (ref 0–40)

## 2015-07-12 LAB — GLUCOSE, CAPILLARY: Glucose-Capillary: 106 mg/dL — ABNORMAL HIGH (ref 65–99)

## 2015-07-12 LAB — MRSA PCR SCREENING: MRSA by PCR: NEGATIVE

## 2015-07-12 LAB — TRIGLYCERIDES: TRIGLYCERIDES: 209 mg/dL — AB (ref <150)

## 2015-07-12 MED ORDER — SODIUM CHLORIDE 0.9 % IV SOLN: INTRAVENOUS | Status: DC

## 2015-07-12 MED ORDER — SODIUM CHLORIDE 0.9 % IV SOLN
INTRAVENOUS | Status: DC
  Administered 2015-07-12 (×2): via INTRAVENOUS

## 2015-07-12 MED ORDER — ONDANSETRON HCL 4 MG/2ML IJ SOLN: 4.0000 mg | Freq: Four times a day (QID) | INTRAMUSCULAR | Status: DC | PRN

## 2015-07-12 MED ORDER — ACETAMINOPHEN 500 MG PO TABS: 1000.0000 mg | ORAL_TABLET | Freq: Four times a day (QID) | ORAL | Status: DC | PRN

## 2015-07-12 MED ORDER — PHENYLEPHRINE HCL 10 MG/ML IJ SOLN
30.0000 ug/min | INTRAVENOUS | Status: DC
  Administered 2015-07-12: 30 ug/min via INTRAVENOUS
  Administered 2015-07-12: 75 ug/min via INTRAVENOUS
  Administered 2015-07-12 (×2): 70 ug/min via INTRAVENOUS
  Administered 2015-07-12: 60 ug/min via INTRAVENOUS
  Administered 2015-07-12: 35 ug/min via INTRAVENOUS
  Administered 2015-07-13: 90 ug/min via INTRAVENOUS
  Administered 2015-07-13: 55 ug/min via INTRAVENOUS
  Administered 2015-07-13: 50 ug/min via INTRAVENOUS
  Administered 2015-07-13: 85 ug/min via INTRAVENOUS
  Administered 2015-07-13: 35 ug/min via INTRAVENOUS
  Administered 2015-07-14 (×2): 50 ug/min via INTRAVENOUS
  Filled 2015-07-12 (×13): qty 1

## 2015-07-12 MED ORDER — ANTISEPTIC ORAL RINSE SOLUTION (CORINZ): 7.0000 mL | Freq: Four times a day (QID) | OROMUCOSAL | Status: DC

## 2015-07-12 MED ORDER — ACETAMINOPHEN 650 MG RE SUPP: 650.0000 mg | Freq: Four times a day (QID) | RECTAL | Status: DC | PRN

## 2015-07-12 MED ORDER — CHLORHEXIDINE GLUCONATE 0.12% ORAL RINSE (MEDLINE KIT)
15.0000 mL | Freq: Two times a day (BID) | OROMUCOSAL | Status: DC
  Administered 2015-07-12 – 2015-07-13 (×3): 15 mL via OROMUCOSAL

## 2015-07-12 MED ORDER — VITAL HIGH PROTEIN PO LIQD
1000.0000 mL | ORAL | Status: DC
  Administered 2015-07-13 (×2): 1000 mL
  Administered 2015-07-13 (×2)

## 2015-07-12 MED ORDER — ANTISEPTIC ORAL RINSE SOLUTION (CORINZ)
7.0000 mL | OROMUCOSAL | Status: DC
  Administered 2015-07-12 – 2015-07-13 (×16): 7 mL via OROMUCOSAL

## 2015-07-12 MED ORDER — NICARDIPINE HCL IN NACL 20-0.86 MG/200ML-% IV SOLN: 5.0000 mg/h | INTRAVENOUS | Status: DC

## 2015-07-12 MED ORDER — ASPIRIN 300 MG RE SUPP
300.0000 mg | Freq: Every day | RECTAL | Status: DC
  Administered 2015-07-12: 300 mg via RECTAL
  Filled 2015-07-12: qty 1

## 2015-07-12 MED ORDER — PROPOFOL 1000 MG/100ML IV EMUL
0.0000 ug/kg/min | INTRAVENOUS | Status: DC
  Administered 2015-07-12: 50 ug/kg/min via INTRAVENOUS
  Administered 2015-07-12: 40 ug/kg/min via INTRAVENOUS
  Administered 2015-07-12: 50 ug/kg/min via INTRAVENOUS
  Administered 2015-07-13 (×2): 45 ug/kg/min via INTRAVENOUS
  Filled 2015-07-12 (×6): qty 100

## 2015-07-12 MED ORDER — ACETAMINOPHEN 325 MG PO TABS
650.0000 mg | ORAL_TABLET | ORAL | Status: DC | PRN
  Administered 2015-07-14 – 2015-07-15 (×2): 650 mg via ORAL
  Filled 2015-07-12 (×2): qty 2

## 2015-07-12 MED ORDER — IOHEXOL 300 MG/ML  SOLN
150.0000 mL | Freq: Once | INTRAMUSCULAR | Status: AC | PRN
  Administered 2015-07-12: 24 mL via INTRAVENOUS

## 2015-07-12 MED ORDER — PRO-STAT SUGAR FREE PO LIQD
30.0000 mL | Freq: Two times a day (BID) | ORAL | Status: DC
  Administered 2015-07-13 (×2): 30 mL
  Filled 2015-07-12 (×2): qty 30

## 2015-07-12 MED ORDER — LABETALOL HCL 5 MG/ML IV SOLN
INTRAVENOUS | Status: DC | PRN
  Administered 2015-07-12: 10 mg via INTRAVENOUS

## 2015-07-12 MED ORDER — ASPIRIN 325 MG PO TABS
325.0000 mg | ORAL_TABLET | Freq: Every day | ORAL | Status: DC
  Administered 2015-07-13: 325 mg via ORAL
  Filled 2015-07-12: qty 1

## 2015-07-12 MED ORDER — STROKE: EARLY STAGES OF RECOVERY BOOK
Freq: Once | Status: AC
  Administered 2015-07-12: 02:00:00
  Filled 2015-07-12: qty 1

## 2015-07-12 MED ORDER — GLYCOPYRROLATE 0.2 MG/ML IJ SOLN
INTRAMUSCULAR | Status: DC | PRN
  Administered 2015-07-12: 0.2 mg via INTRAVENOUS

## 2015-07-12 MED ORDER — FENTANYL CITRATE (PF) 100 MCG/2ML IJ SOLN
50.0000 ug | INTRAMUSCULAR | Status: AC | PRN
  Administered 2015-07-12 (×3): 50 ug via INTRAVENOUS
  Filled 2015-07-12 (×3): qty 2

## 2015-07-12 MED ORDER — FENTANYL CITRATE (PF) 100 MCG/2ML IJ SOLN
INTRAMUSCULAR | Status: DC | PRN
  Administered 2015-07-11: 25 ug via INTRAVENOUS
  Administered 2015-07-12: 50 ug via INTRAVENOUS
  Administered 2015-07-12: 25 ug via INTRAVENOUS

## 2015-07-12 MED ORDER — NITROGLYCERIN 1 MG/10 ML FOR IR/CATH LAB
INTRA_ARTERIAL | Status: AC
  Filled 2015-07-12: qty 10

## 2015-07-12 MED ORDER — FENTANYL CITRATE (PF) 100 MCG/2ML IJ SOLN
50.0000 ug | INTRAMUSCULAR | Status: DC | PRN
  Administered 2015-07-12 – 2015-07-13 (×6): 50 ug via INTRAVENOUS
  Filled 2015-07-12 (×6): qty 2

## 2015-07-12 MED ORDER — PHENYLEPHRINE HCL 10 MG/ML IJ SOLN
10.0000 mg | INTRAVENOUS | Status: DC | PRN
  Administered 2015-07-11: 25 ug/min via INTRAVENOUS

## 2015-07-12 MED ORDER — CHLORHEXIDINE GLUCONATE 0.12% ORAL RINSE (MEDLINE KIT): 15.0000 mL | Freq: Two times a day (BID) | OROMUCOSAL | Status: DC

## 2015-07-12 MED ORDER — PROPOFOL 500 MG/50ML IV EMUL
INTRAVENOUS | Status: DC | PRN
  Administered 2015-07-12: 25 ug/kg/min via INTRAVENOUS

## 2015-07-12 MED ORDER — PANTOPRAZOLE SODIUM 40 MG IV SOLR
40.0000 mg | Freq: Every day | INTRAVENOUS | Status: DC
  Administered 2015-07-12 – 2015-07-16 (×5): 40 mg via INTRAVENOUS
  Filled 2015-07-12 (×5): qty 40

## 2015-07-12 MED ORDER — LABETALOL HCL 5 MG/ML IV SOLN: 10.0000 mg | INTRAVENOUS | Status: DC | PRN

## 2015-07-12 MED ORDER — ACETAMINOPHEN 650 MG RE SUPP
650.0000 mg | RECTAL | Status: DC | PRN
  Administered 2015-07-13: 650 mg via RECTAL
  Filled 2015-07-12: qty 1

## 2015-07-12 MED ORDER — ASPIRIN 325 MG PO TABS: 325.0000 mg | ORAL_TABLET | Freq: Every day | ORAL | Status: DC

## 2015-07-12 NOTE — Progress Notes (Signed)
07/12/15  Neuro IR Rt 9 french femoral sheath removed at 1435.  The sheath appeared to be kinked due to patient bending legs throughout the night.  Exoseal closure device and manual pressure used to achieve hemostasis at 1455.  Distal pulses intact.  Site reviewed with Arboriculturist.  Pressure dressing and sandbag applied to site.  Lt 4 french femoral sheath removed at 1446.  VPAD and manual pressure used to achieve hemostasis at 1502 .  Distal pulses intact.  Site reviewed with Arboriculturist.  Pressure dressing and sandbag applied.  No acute complication.   [facility]

## 2015-07-12 NOTE — Transfer of Care (Signed)
Immediate Anesthesia Transfer of Care Note  Patient: Phillip Solis  Procedure(s) Performed: Procedure(s): RADIOLOGY WITH ANESTHESIA (N/A)  Patient Location: NICU  Anesthesia Type:General  Level of Consciousness: Patient remains intubated per anesthesia plan  Airway & Oxygen Therapy: Patient placed on Ventilator (see vital sign flow sheet for setting)  Post-op Assessment: Report given to RN and Post -op Vital signs reviewed and stable  Post vital signs: Reviewed and stable  Last Vitals:  Filed Vitals:   07/11/15 2245 07/11/15 2255  BP: 124/82 124/82  Pulse: 78 79  Temp: 36.4 C 36.4 C  Resp: 21 19    Complications: No apparent anesthesia complications

## 2015-07-12 NOTE — Progress Notes (Signed)
STROKE TEAM PROGRESS NOTE   HISTORY OF PRESENT ILLNESS Phillip Solis is a 70 y.o. male who was in his normal state of health when his wife was getting around 27 PM. She then went to lay down, and then heard a thump. She went to check on him and found him with right-sided weakness and difficulty speaking. EMS was called and code stroke was activated. Following head CT with no signs of hemorrhage, CT angiogram was performed given the signs of a likely large vessel ischemic stroke. IV TPA was initiated and interventional radiology was notified.   As recently diagnosed with CHF, with an EF 35-40%. He also has a history of atrial fibrillation and a single time after an endoscopy with no recurrence. Given a low chads-Vasc score, he was not started on anticoagulation. Though his recent diagnosis of CHF did raise it to 1, given that he did not have any recurrence in the interim since 2011 and it was only in the setting of a procedure he was not started on anticoagulation.  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 TICI 2 b reperfusion achieved . Small slow flow Lt CC fistula noted with no compromise of distal ICA flow      LKW: 9 PM tpa given?: Yes  NIH: 21 Premorbid modified Rankin: 1   SUBJECTIVE (INTERVAL HISTORY) And family is at bedside today. He is currently intubated.  Patient is on propofol due to agitation. He is also on low-dose neo for blood pressure support.  OBJECTIVE Temp:  [95.2 F (35.1 C)-98 F (36.7 C)] 97.6 F (36.4 C) (03/04 0802) Pulse  Rate:  [50-85] 82 (03/04 0802) Cardiac Rhythm:  [-]  Resp:  [11-22] 22 (03/04 0802) BP: (86-137)/(59-110) 127/72 mmHg (03/04 0700) SpO2:  [98 %-100 %] 100 % (03/04 0802) Arterial Line BP: (94-151)/(56-94) 139/69 mmHg (03/04 0700) FiO2 (%):  [40 %-50 %] 40 % (03/04 0802) Weight:  [73.4 kg (161 lb 13.1 oz)] 73.4 kg (161 lb 13.1 oz) (03/04 0200)  CBC:   Recent Labs Lab 07/11/15 2152 07/11/15 2208 07/12/15 0558  WBC 7.1  --  8.3  NEUTROABS 2.8  --  6.3  HGB 13.8 14.3 12.1*  HCT 40.4 42.0 35.2*  MCV 90.8  --  88.9  PLT 178  --  1891   Basic Metabolic Panel:   Recent Labs Lab 07/11/15 2152 07/11/15 2208 07/12/15 0558  NA 137 140 138  K 4.2 4.2 4.0  CL 105 106 110  CO2 21*  --  19*  GLUCOSE 134* 130* 106*  BUN '18 19 13  '$ CREATININE 1.04 1.00 0.88  CALCIUM 8.5*  --  8.0*    Lipid Panel:     Component Value Date/Time   CHOL 182 07/12/2015 0558   TRIG 209* 07/12/2015 0558   TRIG 207* 07/12/2015 0558   HDL 21* 07/12/2015 0558   CHOLHDL 8.7 07/12/2015 0558   VLDL 41* 07/12/2015 0558   LDLCALC 120* 07/12/2015 0558   HgbA1c:  Lab Results  Component Value Date   HGBA1C  01/11/2010    5.6 (NOTE)                                                                       According to the ADA Clinical Practice Recommendations for 2011, when HbA1c is used as a screening test:   >=6.5%   Diagnostic of Diabetes Mellitus           (if abnormal result  is confirmed)  5.7-6.4%   Increased risk of developing Diabetes Mellitus  References:Diagnosis and Classification of Diabetes Mellitus,Diabetes HCWC,3762,83(TDVVO 1):S62-S69 and Standards of Medical Care in         Diabetes - 2011,Diabetes HYWV,3710,62  (Suppl 1):S11-S61.   Urine Drug Screen:     Component Value Date/Time   LABOPIA NONE DETECTED 07/11/2015 2243   COCAINSCRNUR NONE DETECTED 07/11/2015 2243   LABBENZ NONE DETECTED 07/11/2015 2243   AMPHETMU NONE DETECTED 07/11/2015 2243   THCU NONE DETECTED 07/11/2015 2243    LABBARB NONE DETECTED 07/11/2015 2243      IMAGING  Ct Angio Head W/cm &/or Wo Cm 07/11/2015    CT head:  Dense LEFT MCA and LEFT insular ribbon sign consistent with acute LEFT middle cerebral artery territory infarct.   CTA neck:   Negative.   CTA head:  Emergent distal LEFT M1 occlusion. 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    Ct Head Wo Contrast 07/12/2015   Faint contrast staining LEFT posterior temporal lobe, with improved visualization gray-white matter junction, no hemorrhage.   Ct Head Wo Contrast 07/11/2015   1. No acute intracranial pathology seen on CT.  2. Mild cortical volume loss and scattered small vessel ischemic microangiopathy.     Portable Chest Xray 07/12/2015   1. Endotracheal tube seen ending 1-2 cm above the carina. This could be retracted 2 cm. 2. Minimal bibasilar atelectasis noted. Mild vascular congestion seen.     PHYSICAL EXAM  PHYSICAL EXAM Physical exam: Exam: Gen: NAD Eyes: anicteric sclerae, moist conjunctivae                    CV: no MRG, no carotid bruits, no peripheral edema Mental Status: Alert, follows commands, good historian  PHYSICAL EXAM Intubated and sedated . Afebrile. Head is nontraumatic. Neck is supple without bruit. Cardiac exam no murmur or gallop. Lungs are clear to auscultation. Distal pulses are well felt. Neurological Exam : Intubated and sedated due to significant agitation. Does not open eyes to sternal rub. Nonverbal. Does not follow commands. Could not test vision due to mental status. Conjugate gaze unable to assess extraocular movements. Pupils equally round and reactive to light.  Unable to assess symmetry of face due to intubation appears to have right lower facial weakness. Tongue appears midline in mouth but difficult to assess due to intubation. positive cough and gag. He is moving his left side spontaneously and localizing with the left arm. Right hemiparesis. Does withdraw on the right side  to stimulation but much less than on the left. Toes equivocal.    ASSESSMENT/PLAN [name] is a 70 y.o. male with history of paroxysmal atrial fibrillation (not anticoagulated), palpitations, and congestive heart failure with ejection fraction 35-40% presenting with right hemiparesis and speech difficulties.Marland Kitchen He did receive IV  TPA at 2230 on Friday, 07/11/2015.  S/P bilateral common carotid arteriogram,followed by endovascular revascularization of occluded Lt MCA M1 seg With x 2 passes with the solitaire FR 78m x 40 mm retrieval device And 5 mg of superselective IA tpa. TICI 2 b reperfusion achieved . Small slow flow Lt CC fistula noted with no compromise of distal ICA flow   Stroke:  Non-dominant infarct left middle cerebral artery territory probably embolic secondary to atrial fibrillation.  Resultant right hemiparesis  MRI  pending  MRA pending  Carotid Doppler refer to CTA of the neck  2D Echo  pending  LDL - 21  HgbA1c pending  VTE prophylaxis - SCDs Diet NPO time specified  aspirin 81 mg daily prior to admission, now on No antithrombotic currently secondary to TPA administration.  Patient counseled to be compliant with his antithrombotic medications  Ongoing aggressive stroke risk factor management  Therapy recommendations: Pending  Disposition: Pending  Hypertension  Blood pressure tends to run mildly low. (Low ejection fraction)  Permissive hypertension (OK if < 220/120) but gradually normalize in 5-7 days  Hyperlipidemia  Home meds: Niaspan prior to admission - not reordered  LDL 21, goal < 70  Continue statin at discharge    Other Stroke Risk Factors  Advanced age  Cigarette smoker, quit smoking 42 years ago.  ETOH use  Atrial fibrillation (not anticoagulated)   Other Active Problems  Mild anemia  Endovascular revascularization of occluded Lt MCA M1 segment- Dr DEstanislado Pandy   Hospital day # 1   Personally examined  patient and images, and have participated in and made any corrections needed to history, physical, neuro exam,assessment and plan as stated above.  I have personally obtained the history, evaluated lab date, reviewed imaging studies and agree with radiology interpretations.    ASarina Ill MD Stroke Neurology [phone]      To contact Stroke Continuity provider, please refer to [email or web address] After hours, contact General Neurology

## 2015-07-12 NOTE — Progress Notes (Signed)
PULMONARY  / CRITICAL CARE MEDICINE CONSULTATION   Name: Phillip Solis MRN: [id number] DOB: January 10, 1946    ADMISSION DATE:  07/11/2015 CONSULTATION DATE: July 12, 2015  REQUESTING CLINICIAN: Greta Doom, MD PRIMARY SERVICE: [facility]  CHIEF COMPLAINT:  Altered Mental Status  BRIEF PATIENT DESCRIPTION: 70 y/o man with hx of CHF admitted 3/3 with ischemic stroke now s/p IR clot retrieval.  SIGNIFICANT EVENTS / STUDIES:  3/3>>> IR directed mechanical clot retrieval  LINES / TUBES: ETT 3/3>>>  CULTURES: None  ANTIBIOTICS: None   SUBJECTIVE:  Easily agitated.  Sheath remains in place.  On very low dose neo to keep MAP at neuro goal.  VITAL SIGNS: Temp:  [95.2 F (35.1 C)-98.1 F (36.7 C)] 98.1 F (36.7 C) (03/04 0830) Pulse Rate:  [50-85] 63 (03/04 0830) Resp:  [11-22] 14 (03/04 0830) BP: (86-137)/(57-110) 101/57 mmHg (03/04 0830) SpO2:  [98 %-100 %] 100 % (03/04 0830) Arterial Line BP: (94-151)/(56-94) 121/65 mmHg (03/04 0830) FiO2 (%):  [40 %-50 %] 40 % (03/04 0802) Weight:  [73.4 kg (161 lb 13.1 oz)] 73.4 kg (161 lb 13.1 oz) (03/04 0200) HEMODYNAMICS:   VENTILATOR SETTINGS: Vent Mode:  [-] PRVC FiO2 (%):  [40 %-50 %] 40 % Set Rate:  [14 bmp] 14 bmp Vt Set:  [480 mL-600 mL] 600 mL PEEP:  [5 cmH20] 5 cmH20 Plateau Pressure:  [15 cmH20-17 cmH20] 17 cmH20 INTAKE / OUTPUT: Intake/Output      03/03 0701 - 03/04 0700 03/04 0701 - 03/05 0700   I.V. (mL/kg) 2210.7 (30.1) 148.1 (2)   Total Intake(mL/kg) 2210.7 (30.1) 148.1 (2)   Urine (mL/kg/hr) 1450    Blood 50    Total Output 1500     Net +710.7 +148.1          PHYSICAL EXAMINATION: General:  NAD on vent  Neuro:  Sedated, RASS -2 right now, but easily agitated with minimal stimulation per nsg HEENT:  MMM, ETT Neck: No LAD Cardiovascular:  Heart sounds dual and normal. Regular rate. Lungs: resps evne non labored on vent, Clear to auscultation. Abdomen:  Soft Musculoskeletal:  No swollen  joints Skin:  No rashes.  LABS:  CBC  Recent Labs Lab 07/11/15 2152 07/11/15 2208 07/12/15 0558  WBC 7.1  --  8.3  HGB 13.8 14.3 12.1*  HCT 40.4 42.0 35.2*  PLT 178  --  160   Coag's  Recent Labs Lab 07/11/15 2152  APTT 25  INR 0.96   BMET  Recent Labs Lab 07/11/15 2152 07/11/15 2208 07/12/15 0558  NA 137 140 138  K 4.2 4.2 4.0  CL 105 106 110  CO2 21*  --  19*  BUN '18 19 13  '$ CREATININE 1.04 1.00 0.88  GLUCOSE 134* 130* 106*   Electrolytes  Recent Labs Lab 07/11/15 2152 07/12/15 0558  CALCIUM 8.5* 8.0*   Sepsis Markers No results for input(s): LATICACIDVEN, PROCALCITON, O2SATVEN in the last 168 hours. ABG  Recent Labs Lab 07/12/15 0425  PHART 7.427  PCO2ART 28.8*  PO2ART 175*   Liver Enzymes  Recent Labs Lab 07/11/15 2152  AST 22  ALT 14*  ALKPHOS 62  BILITOT 0.4  ALBUMIN 3.3*   Cardiac Enzymes No results for input(s): TROPONINI, PROBNP in the last 168 hours. Glucose  Recent Labs Lab 07/11/15 2223  GLUCAP 100*    Imaging Ct Angio Head W/cm &/or Wo Cm  07/11/2015  CLINICAL DATA:  Code stroke, RIGHT facial droop. Slurred speech and uneven pupils. History of  atrial fibrillation. EXAM: CT ANGIOGRAPHY HEAD TECHNIQUE: Multidetector CT imaging of the head was performed using the standard protocol during bolus administration of intravenous contrast. Multiplanar CT image reconstructions and MIPs were obtained to evaluate the vascular anatomy. CONTRAST:  78m OMNIPAQUE IOHEXOL 350 MG/ML SOLN COMPARISON:  CT head July 11, 2015 at 2205 hours FINDINGS: CT HEAD Mildly dense LEFT MCA with LEFT insular ribbon sign. No intraparenchymal hemorrhage, mass effect, midline shift. Ventricles and sulci are overall normal for patient's age, cavum septum pellucidum is a normal variant. Subcentimeter density RIGHT parasagittal parietal lobe suggests developmental venous anomaly. No abnormal extra-axial fluid collections. Basal cisterns are patent. Ocular globes  and orbital contents are normal. Paranasal sinuses and mastoid air cells are well aerated. No skull fracture. Multiple prominent vascular lakes at the convexity. Patient is edentulous. CTA NECK Aortic arch: Normal appearance of the thoracic arch, normal branch pattern. Mild calcific atherosclerosis of the aortic arch. The origins of the innominate, left Common carotid artery and subclavian artery are widely patent. Right carotid system: Common carotid artery is widely patent, coursing in a straight line fashion. Normal appearance of the carotid bifurcation without hemodynamically significant stenosis by NASCET criteria. Normal appearance of the included internal carotid artery. Left carotid system: Common carotid artery is widely patent, coursing in a straight line fashion. Normal appearance of the carotid bifurcation without hemodynamically significant stenosis by NASCET criteria. Mild eccentric calcific atherosclerosis. Normal appearance of the included internal carotid artery. Vertebral arteries:Left vertebral artery is dominant. Normal appearance of the vertebral arteries, which appear widely patent. Skeleton: No acute osseous process though bone windows have not been submitted. Moderate to severe multilevel degenerative discs resulting in moderate to severe neural foraminal narrowing C3-4, C4-5. Old mild T2 compression fracture with scoliosis. No destructive bony lesions. Other neck: Soft tissues of the neck are nonacute though, not tailored for evaluation. CTA HEAD Anterior circulation: Normal appearance of the cervical internal carotid arteries, petrous, cavernous and supra clinoid internal carotid arteries. Widely patent anterior communicating artery. LEFT A1 is developmentally dominant. Acute distal LEFT M1 segment occlusion. Minimal collateral present on CTA. RIGHT middle cerebral artery vessels are patent. Posterior circulation: Normal appearance of the vertebral arteries, vertebrobasilar junction and  basilar artery, as well as main branch vessels. Normal appearance of the posterior cerebral arteries. Fetal origin RIGHT posterior cerebral artery. No hemodynamically significant stenosis, dissection, luminal irregularity, contrast extravasation or aneurysm within the anterior nor posterior circulation. 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 CTA neck:  Negative. CTA head: Emergent distal LEFT M1 occlusion. 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 Acute findings discussed with and re[facility] at 10:23 pm. Electronically Signed   By: CElon AlasM.D.   On: 07/11/2015 23:00   Ct Head Wo Contrast  07/12/2015  CLINICAL DATA:  Follow-up LEFT middle cerebral artery stroke, status post LEFT middle cerebral endovascular revascularization. EXAM: CT HEAD WITHOUT CONTRAST TECHNIQUE: Contiguous axial images were obtained from the base of the skull through the vertex without intravenous contrast. COMPARISON:  CT head July 11, 2015 FINDINGS: Faint contrast staining of LEFT posterior temporal lobe, improved visualization of insula gray-white matter junction. Postcontrast imaging without evidence of hemorrhage, mass effect or midline shift. Ventricles and sulci are normal for patient's age. Subcentimeter enhancement RIGHT parasagittal parietal lobe compatible with developmental venous anomaly. No abnormal extra-axial fluid collections. Minimal calcific atherosclerosis the carotid siphons. Old RIGHT medial orbital blowout  fracture. Mild paranasal sinus mucosal thickening, life-support lines in place. Mastoid air cells are well aerated. Prominent vascular lakes at the vertex. IMPRESSION: Faint contrast staining LEFT posterior temporal lobe, with improved visualization gray-white matter junction, no hemorrhage. Electronically Signed   By: Elon Alas M.D.   On: 07/12/2015 02:05    Ct Head Wo Contrast  07/11/2015  CLINICAL DATA:  Code stroke. Right-sided facial droop and slurred speech. Uneven pupils. Initial encounter. EXAM: CT HEAD WITHOUT CONTRAST TECHNIQUE: Contiguous axial images were obtained from the base of the skull through the vertex without intravenous contrast. COMPARISON:  CT of the head performed 2020-03-2115 FINDINGS: There is no evidence of acute infarction, mass lesion, or intra- or extra-axial hemorrhage on CT. Prominence of the ventricles and sulci suggests mild cortical volume loss. Scattered subcortical and periventricular white matter change likely reflects small vessel ischemic microangiopathy, relatively stable from the prior study. Mild cerebellar atrophy is noted. The brainstem and fourth ventricle are within normal limits. The basal ganglia are unremarkable in appearance. The cerebral hemispheres demonstrate grossly normal gray-white differentiation. No mass effect or midline shift is seen. There is no evidence of fracture; chronic scattered calvarial lucencies are again noted at the vertex, relatively stable in appearance. The orbits are within normal limits. The paranasal sinuses and mastoid air cells are well-aerated. No significant soft tissue abnormalities are seen. IMPRESSION: 1. No acute intracranial pathology seen on CT. 2. Mild cortical volume loss and scattered small vessel ischemic microangiopathy. These results were called by telephone at the time of interpretation on 07/11/2015 at 10:18 pm to Dr. Leonel Ramsay, who verbally acknowledged these results. Electronically Signed   By: Garald Balding M.D.   On: 07/11/2015 22:21   Ct Angio Neck W/cm &/or Wo/cm  07/11/2015  CLINICAL DATA:  Code stroke, RIGHT facial droop. Slurred speech and uneven pupils. History of atrial fibrillation. EXAM: CT ANGIOGRAPHY HEAD TECHNIQUE: Multidetector CT imaging of the head was performed using the standard protocol during bolus administration of intravenous contrast.  Multiplanar CT image reconstructions and MIPs were obtained to evaluate the vascular anatomy. CONTRAST:  62m OMNIPAQUE IOHEXOL 350 MG/ML SOLN COMPARISON:  CT head July 11, 2015 at 2205 hours FINDINGS: CT HEAD Mildly dense LEFT MCA with LEFT insular ribbon sign. No intraparenchymal hemorrhage, mass effect, midline shift. Ventricles and sulci are overall normal for patient's age, cavum septum pellucidum is a normal variant. Subcentimeter density RIGHT parasagittal parietal lobe suggests developmental venous anomaly. No abnormal extra-axial fluid collections. Basal cisterns are patent. Ocular globes and orbital contents are normal. Paranasal sinuses and mastoid air cells are well aerated. No skull fracture. Multiple prominent vascular lakes at the convexity. Patient is edentulous. CTA NECK Aortic arch: Normal appearance of the thoracic arch, normal branch pattern. Mild calcific atherosclerosis of the aortic arch. The origins of the innominate, left Common carotid artery and subclavian artery are widely patent. Right carotid system: Common carotid artery is widely patent, coursing in a straight line fashion. Normal appearance of the carotid bifurcation without hemodynamically significant stenosis by NASCET criteria. Normal appearance of the included internal carotid artery. Left carotid system: Common carotid artery is widely patent, coursing in a straight line fashion. Normal appearance of the carotid bifurcation without hemodynamically significant stenosis by NASCET criteria. Mild eccentric calcific atherosclerosis. Normal appearance of the included internal carotid artery. Vertebral arteries:Left vertebral artery is dominant. Normal appearance of the vertebral arteries, which appear widely patent. Skeleton: No acute osseous process though bone windows have not been submitted. Moderate to severe  multilevel degenerative discs resulting in moderate to severe neural foraminal narrowing C3-4, C4-5. Old mild T2  compression fracture with scoliosis. No destructive bony lesions. Other neck: Soft tissues of the neck are nonacute though, not tailored for evaluation. CTA HEAD Anterior circulation: Normal appearance of the cervical internal carotid arteries, petrous, cavernous and supra clinoid internal carotid arteries. Widely patent anterior communicating artery. LEFT A1 is developmentally dominant. Acute distal LEFT M1 segment occlusion. Minimal collateral present on CTA. RIGHT middle cerebral artery vessels are patent. Posterior circulation: Normal appearance of the vertebral arteries, vertebrobasilar junction and basilar artery, as well as main branch vessels. Normal appearance of the posterior cerebral arteries. Fetal origin RIGHT posterior cerebral artery. No hemodynamically significant stenosis, dissection, luminal irregularity, contrast extravasation or aneurysm within the anterior nor posterior circulation. 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 CTA neck:  Negative. CTA head: Emergent distal LEFT M1 occlusion. 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 Acute findings discussed with and reconfirmed by Dr.MCNEILL [facility] on 07/11/2015 at 10:23 pm. Electronically Signed   By: Elon Alas M.D.   On: 07/11/2015 23:00   Portable Chest Xray  07/12/2015  CLINICAL DATA:  Endotracheal tube placement.  Initial encounter. EXAM: PORTABLE CHEST 1 VIEW COMPARISON:  Chest radiograph performed Aug 06, 202016 FINDINGS: The patient's endotracheal tube is seen ending 1-2 cm above the carina. This could be retracted 2 cm. Minimal bibasilar atelectasis is noted. Mild vascular congestion is noted. No pleural effusion or pneumothorax is seen. The cardiomediastinal silhouette is borderline normal in size. No acute osseous abnormalities are identified. IMPRESSION: 1. Endotracheal tube seen ending 1-2 cm above the carina. This could be  retracted 2 cm. 2. Minimal bibasilar atelectasis noted. Mild vascular congestion seen. Electronically Signed   By: Garald Balding M.D.   On: 07/12/2015 02:51    ASSESSMENT / PLAN:  NEUROLOGIC A: - Acute Ischemic Stroke s/p tPA and catheter-directed clot retrieval. P:   Per neuro  MRI brain pending   PULMONARY A: Need for mechanical ventilation post neuro IR P:   Vent support - 8cc/kg  F/u CXR  F/u ABG Plan to keep sedated, vented while sheath in place and likely until MRI performed   CARDIOVASCULAR A: Hx CHF - EF35% PAF - one time, in setting procedure, not on anticoag P:   Low dose neo gtt to maintain map goals  Echo pending   RENAL A: No active issue  P:   Chem in am   GASTROINTESTINAL A: No active issue  P:   TF  PPI   HEMATOLOGIC No active issues P:   F/u cbc  SCD's  INFECTIOUS A: No active issues P:   Monitor wbc, fever curve off abx   ENDOCRINE A: DM2 P:   Will monitor glucose   TODAY'S SUMMARY: 70 y/o man with acute, ischemic stroke on vent post procedure.    Nickolas Madrid, NP 07/12/2015  9:43 AM Pager: [phone] or [phone]

## 2015-07-12 NOTE — Progress Notes (Signed)
Dr Elsworth Soho notified of pt concern of pulmonary edema due to lungs sounds with rhonchi bilateral and slight pink tinge sputum coming from ETT. Stated with maintenance fluid, sedation and pressor he was getting approx 218m/hr. Orders given to stop maintenance fluid. Will continue to monitor.

## 2015-07-12 NOTE — Progress Notes (Signed)
Notified Dr Leonel Ramsay of follow up CT results and patient waking up and trying to pull at ETT more often despite max propofol/fentanyl PRN. Orders received to administer aspirin and for wrist restraints. Will continue to monitor.

## 2015-07-12 NOTE — Progress Notes (Signed)
Dr Elsworth Soho notified of pt blood pressure in 116's which is not Dr Arlean Hopping order. He stated as long as he's over 100 SBP that is fine. Will continue to monitor.

## 2015-07-12 NOTE — Progress Notes (Signed)
[facility] Progress Note Patient Name: Phillip Solis DOB: 1945/05/29 MRN: [id number]   Date of Service  07/12/2015  HPI/Events of Note  Relative hypotension with goal SBP to be between 120 and 140 mmHg.  Pressure lower due to need to continue sedation on vent.    eICU Interventions  Plan: Start NEO gtt for BP support     Intervention Category Intermediate Interventions: Hypotension - evaluation and management  DETERDING,ELIZABETH 07/12/2015, 5:08 AM

## 2015-07-12 NOTE — Progress Notes (Signed)
eLink Physician-Brief Progress Note Patient Name: SILVIA MARKUSON DOB: [birth year] MRN: [id number]   Date of Service  07/12/2015  HPI/Events of Note  Patient admitted following code stroke.  To IR for intervention.  Returned to ICU sedated and vented.  eICU Interventions  Orders for sedation with propofol and prn fentanyl. Vent order set placed     Intervention Category Minor Interventions: Routine modifications to care plan (e.g. PRN medications for pain, fever)  DETERDING,ELIZABETH 07/12/2015, 2:37 AM

## 2015-07-12 NOTE — Progress Notes (Signed)
Initial Nutrition Assessment  DOCUMENTATION CODES:  Not applicable  INTERVENTION:  Initiate Vital high protein @ 20 ml/hr via NG/OGT and increase by 10 ml every 4 hours to goal rate of 40 ml/hr.   30 ml Prostat BID.    Tube feeding regimen provides 1160 kcal + 581 from propofol (97% of needs), 114 grams of protein, and 803 ml of H2O.   RD to F/U Monday to reassess TF  NUTRITION DIAGNOSIS:  Inadequate oral intake related to inability to eat as evidenced by estimated needs.  GOAL:  Patient will meet greater than or equal to 90% of their needs  MONITOR:  Diet advancement, Vent status, Labs, I & O's, TF tolerance  REASON FOR ASSESSMENT:  Consult Enteral/tube feeding initiation and management  ASSESSMENT:  70 y/o male PMHx Afib, CHF presents after suffering acute ischemic stroke s/p IR directed mechanical clot retrieval. Remains intubated post procedure.   Pt intubated, sedated. No historians presents  Per RN, Propofol will likely be weaned down in near future, however cannot be sure at this time.   Abdomen soft, non distended  Patient is currently intubated on ventilator support MV: 8.1 L/min Temp (24hrs), Avg:98.7 F (37.1 C), Min:95.2 F (35.1 C), Max:99.7 F (37.6 C)  Propofol: 22 ml/hr =581  Kcal daily  Labs reviewed: Elevated triglycerides, slightly anemic, lipid panel results noted  NFPE: WDL  Diet Order:  Diet NPO time specified  Skin: Surgical incision  Last BM:  Unknown  Height:  Ht Readings from Last 1 Encounters:  07/12/15 6' (1.829 m)   Weight:  Wt Readings from Last 1 Encounters:  07/12/15 161 lb 13.1 oz (73.4 kg)   Ideal Body Weight:  80.91 kg  BMI:  Body mass index is 21.94 kg/(m^2).  Estimated Nutritional Needs:  Kcal:  9767 Protein:  103-117 (1.4-1.6 g/kg bw) Fluid:  Per MD  EDUCATION NEEDS:  No education needs identified at this time  Burtis Junes RD, LDN Nutrition Pager: [phone] 07/12/2015 7:18 PM

## 2015-07-12 NOTE — Progress Notes (Signed)
SLP Cancellation Note  Patient Details Name: HADRIAN YARBROUGH MRN: [id number] DOB: 01-03-1946   Cancelled treatment:       Reason Eval/Treat Not Completed: Medical issues which prohibited therapy (Patient intubated. Will f/u 3/5. )  Gabriel Rainwater [place], CCC-SLP [phone]  Nohemy Koop Meryl 07/12/2015, 10:22 AM

## 2015-07-12 NOTE — Procedures (Signed)
S/P bilateral common carotid arteriogram,followed by endovascular revascularization of occluded Lt MCA M1 seg  With x 2 passes with the solitaire FR 45m x 40 mm retrieval device  And 5 mg of superselective IA tpa. TICI 2 b reperfusion achieved . Small slow flow Lt CC  fistula  noted with no compromise of distal ICA flow

## 2015-07-13 ENCOUNTER — Inpatient Hospital Stay (HOSPITAL_COMMUNITY): Payer: Medicare HMO

## 2015-07-13 ENCOUNTER — Other Ambulatory Visit: Payer: Self-pay

## 2015-07-13 DIAGNOSIS — I6789 Other cerebrovascular disease: Secondary | ICD-10-CM

## 2015-07-13 LAB — COMPREHENSIVE METABOLIC PANEL
ALBUMIN: 2.7 g/dL — AB (ref 3.5–5.0)
ALK PHOS: 57 U/L (ref 38–126)
ALT: 19 U/L (ref 17–63)
AST: 39 U/L (ref 15–41)
Anion gap: 10 (ref 5–15)
BILIRUBIN TOTAL: 1 mg/dL (ref 0.3–1.2)
BUN: 12 mg/dL (ref 6–20)
CALCIUM: 7.8 mg/dL — AB (ref 8.9–10.3)
CO2: 25 mmol/L (ref 22–32)
Chloride: 104 mmol/L (ref 101–111)
Creatinine, Ser: 0.92 mg/dL (ref 0.61–1.24)
GFR calc Af Amer: >60 mL/min (ref >60)
GFR calc non Af Amer: >60 mL/min (ref >60)
GLUCOSE: 101 mg/dL — AB (ref 65–99)
Potassium: 3.7 mmol/L (ref 3.5–5.1)
Sodium: 139 mmol/L (ref 135–145)
TOTAL PROTEIN: 5.1 g/dL — AB (ref 6.5–8.1)

## 2015-07-13 LAB — GLUCOSE, CAPILLARY
GLUCOSE-CAPILLARY: 73 mg/dL (ref 65–99)
GLUCOSE-CAPILLARY: 81 mg/dL (ref 65–99)
GLUCOSE-CAPILLARY: 76 mg/dL (ref 65–99)
Glucose-Capillary: 103 mg/dL — ABNORMAL HIGH (ref 65–99)
Glucose-Capillary: 76 mg/dL (ref 65–99)
Glucose-Capillary: 79 mg/dL (ref 65–99)

## 2015-07-13 LAB — CBC
HCT: 36.4 % — ABNORMAL LOW (ref 39.0–52.0)
Hemoglobin: 11.7 g/dL — ABNORMAL LOW (ref 13.0–17.0)
MCH: 29.4 pg (ref 26.0–34.0)
MCHC: 32.1 g/dL (ref 30.0–36.0)
MCV: 91.5 fL (ref 78.0–100.0)
Platelets: 160 10*3/uL (ref 150–400)
RBC: 3.98 MIL/uL — ABNORMAL LOW (ref 4.22–5.81)
RDW: 14.5 % (ref 11.5–15.5)
WBC: 7.9 10*3/uL (ref 4.0–10.5)

## 2015-07-13 MED ORDER — SODIUM CHLORIDE 0.9 % IV BOLUS (SEPSIS)
1000.0000 mL | Freq: Once | INTRAVENOUS | Status: AC
  Administered 2015-07-13: 1000 mL via INTRAVENOUS

## 2015-07-13 MED ORDER — SODIUM CHLORIDE 0.9 % IV SOLN
INTRAVENOUS | Status: DC
  Administered 2015-07-14: 04:00:00 via INTRAVENOUS

## 2015-07-13 NOTE — Progress Notes (Signed)
PT Cancellation Note  Patient Details Name: THAMAS APPLEYARD MRN: [id number] DOB: 08/03/1945   Cancelled Treatment:    Reason Eval/Treat Not Completed: Patient not medically ready   Discussed case with RN;  Currently on the vent; hopeful for extubation today;   Will follow up for PT eval tomorrow;  Thank you,  Roney Marion, PT  Acute Rehabilitation Services Pager [phone] Office [phone]    Roney Marion [facility] 07/13/2015, 10:03 AM

## 2015-07-13 NOTE — Progress Notes (Signed)
STROKE TEAM PROGRESS NOTE   HISTORY OF PRESENT ILLNESS (at the time of admission) Phillip Solis is a 70 y.o. male who was in his normal state of health when his wife was getting around 32 PM. She then went to lay down, and then heard a thump. She went to check on him and found him with right-sided weakness and difficulty speaking. EMS was called and code stroke was activated. Following head CT with no signs of hemorrhage, CT angiogram was performed given the signs of a likely large vessel ischemic stroke. IV TPA was initiated and interventional radiology was notified.   As recently diagnosed with CHF, with an EF 35-40%. He also has a history of atrial fibrillation and a single time after an endoscopy with no recurrence. Given a low chads-Vasc score, he was not started on anticoagulation. Though his recent diagnosis of CHF did raise it to 1, given that he did not have any recurrence in the interim since 2011 and it was only in the setting of a procedure he was not started on anticoagulation.  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 TICI 2 b reperfusion achieved . Small slow flow Lt CC fistula noted with no compromise of distal ICA flow      LKW: 9 PM tpa given?: Yes  NIH: 21 Premorbid modified Rankin: 1   SUBJECTIVE (INTERVAL HISTORY) No family is at bedside today. He is currently intubated.  Patient is on propofol due to agitation. He is also on Neo gtt for blood pressure support.  Overnight the CT of the head was read and ASA was initiated.  Yesterday  nursing team raised the question of pulmonary edema due to increased crackles on auscultation.  Maintenance fluids were decreased.  Per RN, patient followed a single command consistently at 5Cashme[facility]esterday however, that was the only time.  OBJECTIVE Temp:  [98.4 F (36.9 C)-100.2 F (37.9 C)] 100.2 F (37.9 C) (03/05 0739) Pulse Rate:  [51-90] 83 (03/05 0945) Cardiac Rhythm:  [-] Normal sinus rhythm (03/05 0800) Resp:  [13-27] 18 (03/05 0945) BP: (73-150)/(50-94) 110/72 mmHg (03/05 0945) SpO2:  [99 %-100 %] 100 % (03/05 0945) Arterial Line BP: (93-155)/(50-96) 115/61 mmHg (03/04 1430) FiO2 (%):  [40 %] 40 % (03/05 0900) Weight:  [77.3 kg (170 lb 6.7 oz)] 77.3 kg (170 lb 6.7 oz) (03/05 0403)  CBC:   Recent Labs Lab 07/11/15 2152 07/11/15 2208 07/12/15 0558  WBC 7.1  --  8.3  NEUTROABS 2.8  --  6.3  HGB 13.8 14.3 12.1*  HCT 40.4 42.0 35.2*  MCV 90.8  --  88.9  PLT 178  --  1856   Basic Metabolic Panel:   Recent Labs Lab 07/11/15 2152 07/11/15 2208 07/12/15 0558  NA 137 140 138  K 4.2 4.2 4.0  CL 105 106 110  CO2 21*  --  19*  GLUCOSE 134* 130* 106*  BUN '18 19 13  '$ CREATININE 1.04 1.00 0.88  CALCIUM 8.5*  --  8.0*    Lipid Panel:  Component Value Date/Time   CHOL 182 07/12/2015 0558   TRIG 209* 07/12/2015 0558   TRIG 207* 07/12/2015 0558   HDL 21* 07/12/2015 0558   CHOLHDL 8.7 07/12/2015 0558   VLDL 41* 07/12/2015 0558   LDLCALC 120* 07/12/2015 0558   HgbA1c:  Lab Results  Component Value Date   HGBA1C  01/11/2010    5.6 (NOTE)                                                                       According to the ADA Clinical Practice Recommendations for 2011, when HbA1c is used as a screening test:   >=6.5%   Diagnostic of Diabetes Mellitus           (if abnormal result  is confirmed)  5.7-6.4%   Increased risk of developing Diabetes Mellitus  References:Diagnosis and Classification of Diabetes Mellitus,Diabetes KGMW,1027,25(DGUYQ 1):S62-S69 and  Standards of Medical Care in         Diabetes - 2011,Diabetes Care,2011,34  (Suppl 1):S11-S61.   Urine Drug Screen:     Component Value Date/Time   LABOPIA NONE DETECTED 07/11/2015 2243   COCAINSCRNUR NONE DETECTED 07/11/2015 2243   LABBENZ NONE DETECTED 07/11/2015 2243   AMPHETMU NONE DETECTED 07/11/2015 2243   THCU NONE DETECTED 07/11/2015 2243   LABBARB NONE DETECTED 07/11/2015 2243      IMAGING  Ct Angio Head W/cm &/or Wo Cm 07/11/2015    CT head:  Dense LEFT MCA and LEFT insular ribbon sign consistent with acute LEFT middle cerebral artery territory infarct.   CTA neck:   Negative.   CTA head:  Emergent distal LEFT M1 occlusion. 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    Ct Head Wo Contrast 07/12/2015   Faint contrast staining LEFT posterior temporal lobe, with improved visualization gray-white matter junction, no hemorrhage.   Ct Head Wo Contrast 07/11/2015   1. No acute intracranial pathology seen on CT.  2. Mild cortical volume loss and scattered small vessel ischemic microangiopathy.     Portable Chest Xray 07/12/2015   1. Endotracheal tube seen ending 1-2 cm above the carina. This could be retracted 2 cm. 2. Minimal bibasilar atelectasis noted. Mild vascular congestion seen. Radiology recommendation was to retract 2 cm  07/13/2015 IMPRESSION: Minimal RIGHT basilar atelectasis.  Tip of endotracheal tube projects 4.4 cm above carina.    PHYSICAL EXAM Exam: Gen: NAD Eyes: anicteric sclerae, moist conjunctivae; sclera are slightly injected Chest:  CTA                   CV: no MRG, no carotid bruits, no peripheral edema Extrem:  No C/C/E  Neurological Exam: Intubated and sedated due to significant agitation.   Mental Status:  Does open eyes to sternal rub. Does not follow commands. Could not test vision due to mental status. PERRL. Conjugate gaze and normal OCRs.  Unable to assess symmetry of face due to intubation.  Positive cough and gag.    Motor/Sensory:  He was observed to move both legs off the edge of the bed on his own.  But does not follow commands.  He is moving his left side spontaneously and localizing with the left arm. Right hemiparesis. Does withdraw on the right side  to stimulation but much less than on the left. Toes equivocal.  Coordination/Gait:  Deferred    ASSESSMENT/PLAN Phillip Solis is a 70 y.o. male with history of paroxysmal atrial fibrillation (not anticoagulated), palpitations, and congestive heart failure with ejection fraction 35-40% presenting with right hemiparesis and speech difficulties.Marland Kitchen He did receive IV TPA at 2230 on Friday, 07/11/2015.  S/P bilateral common carotid arteriogram,followed by endovascular revascularization of occluded Lt MCA M1 seg With x 2 passes with the solitaire FR 69m x 40 mm retrieval device And 5 mg of superselective IA tpa. TICI 2 b reperfusion achieved . Small slow flow Lt CC fistula noted with no compromise of distal ICA flow   Stroke:  Non-dominant infarct left middle cerebral artery territory probably embolic secondary to atrial fibrillation.  Resultant right hemiparesis  MRI  Pending (held due to significant agitation; hwever, CT was performed and antiplatelet decision was made)  MRA pending (held due to significant agitation; hwever, CT was performed and antiplatelet decision was made)  Carotid Doppler refer to CTA of the neck  2D Echo  pending  LDL - 21  HgbA1c pending  VTE prophylaxis - SCDs Diet NPO time specified  aspirin 81 mg daily prior to admission, now on No antithrombotic currently secondary to TPA administration.  Patient counseled to be compliant with his antithrombotic medications  Ongoing aggressive stroke risk factor management  Therapy recommendations: Pending  Disposition: Pending  Hypertension  Blood pressure tends to run mildly low. (Low ejection fraction)  Permissive hypertension (OK if < 220/120) but gradually  normalize in 5-7 days  Hyperlipidemia  Home meds: Niaspan prior to admission - not reordered  LDL 21, goal < 70  Continue statin at discharge  Other Stroke Risk Factors  Advanced age  Cigarette smoker, quit smoking 42 years ago.  ETOH use  Atrial fibrillation (not anticoagulated)  Other Active Problems  Mild anemia  Endovascular revascularization of occluded Lt MCA M1 segment- Dr DEstanislado Pandy   Hospital day # 2  CRITICAL CARE NEUROLOGY ATTENDING NOTE Patient was seen and examined by me personally. I reviewed notes, independently viewed imaging studies, participated in medical decision making and plan of care. I have made additions or clarifications directly to the above note.  Documentation accurately reflects findings. The laboratory and radiographic studies were personally reviewed by me.  ROS:  pertinent positives could not be fully documented due to LOC  Assessment and plan completed by me personally and fully documented above.  CBC, CMP for today pending   Labs and CXR for tomorrow ordered  Wean sedation when tolerated  MRI/A when patient safe to undergo  Continue vasopressor support as needed  Condition is critical  This patient is critically ill and at significant risk of neurological worsening, death and care requires constant monitoring of vital signs, hemodynamics,respiratory and cardiac monitoring, extensive review of multiple databases, frequent neurological assessment, discussion with family, other specialists and medical decision making of high complexity.  This critical care time does not reflect procedure time, or teaching time or supervisory time of PA/NP/Med Resident etc. but could involve care discussion time.  I spent 30 minutes of Neurocritical Care time in the care of  this patient.  SIGNED BY: Dr. CElissa Hefty       To contact Stroke Continuity provider, please refer to [email or web address] After hours, contact General Neurology

## 2015-07-13 NOTE — Consult Note (Addendum)
Cardiology Consult    Patient ID: Phillip Solis MRN: [id number], DOB/AGE: 01/16/1946   Admit date: 07/11/2015 Date of Consult: 07/13/2015  Primary Physician: Teressa Lower, MD Primary Cardiologist: Debara Pickett Requesting Provider: Elsworth Soho  Patient Profile    70M with pAF and HFrEF (EF nadir 35-40% with improvement to 40-45%) who presents with an acute ischemic stroke on 07/11/15 and developed AF with RVR.   Past Medical History   Past Medical History  Diagnosis Date  . History of atrial fibrillation     in setting of endoscopy in 2011; resolved  . History of palpitations     Past Surgical History  Procedure Laterality Date  . Left and right heart catheterization with coronary angiogram N/A 07/23/2014    Procedure: LEFT AND RIGHT HEART CATHETERIZATION WITH CORONARY ANGIOGRAM;  Surgeon: [name], MD;  Location: [facility] CATH LAB;  Service: Cardiovascular;  Laterality: N/A;     Allergies  No Known Allergies  History of Present Illness     70M with pAF and HFrEF (EF nadir 35-40% with improvement to 40-45%) who presents with an acute ischemic stroke on 07/11/15. He was in NSR on arrival. He was given tPA and subsequently udnerwent emergent bilateral cerebral angiograms with endovascular revascularizaton of an occluded left MCA M1 segment. The etiology was presumted to be cardioembolic due to AF. He was initially intubated in and managed in the ICU and was extubated today. He is has some expressive aphasia but has regained much of his motor function. He has been on phenylephrine to allow for adequate brain perfusion. TTE was performed today and demonstrated EFof 45% to 50% with akinesis of the basal-midinferolateral. No bubble study performed. He also underwent MRI today which demonstrateted acute infarct of roughly 1/3 left MCA territory, most confluent in the inferior territory. No suspected hemorrhagic conversion. On return to the ICU, he was noted to be in AF with RVR with rates in the 120s.  Cardiology is consulted for management of AF.   On my interview, he reported some mild palpitations but otherwise no symptoms. His sisters who were at bedside report multiple 1st degree relatives with a history of AF including one with a large stroke.   Of note, Mr. [name] was diagnosed with pAF during an endoscopic procedure in 2011. He had no clinical recurrence. At that time, his CHADS was 0 and he was not started on anticoagulation.  In 2016, he developed CHF and was diagnosed with LV systolic dysfunction with EF 35-40%. Angiography demonstrated no CAD. He was managed with Entresto an bisoprolol and his EF improved to 40-45%.    Inpatient Medications    . aspirin  325 mg Oral Daily   Or  . aspirin  300 mg Rectal Daily  . pantoprazole (PROTONIX) IV  40 mg Intravenous QHS    Family History    Family History  Problem Relation Age of Onset  . Heart Problems Mother   . Cancer Father     Social History    Social History   Social History  . Marital Status: Married    Spouse Name: N/A  . Number of Children: 2  . Years of Education: N/A   Occupational History  . Not on file.   Social History Main Topics  . Smoking status: Former Smoker    Quit date: 03/19/1973  . Smokeless tobacco: Current User    Types: Chew  . Alcohol Use: 0.0 oz/week    0 Standard drinks or equivalent per week  Comment: occasional beer  . Drug Use: No  . Sexual Activity: Not on file   Other Topics Concern  . Not on file   Social History Narrative     Review of Systems    General:  No chills, fever, night sweats or weight changes.  Cardiovascular:  No chest pain, dyspnea on exertion, edema, orthopnea, paroxysmal nocturnal dyspnea. He does report mild palpitations.  Dermatological: No rash, lesions/masses Respiratory: No cough, dyspnea Abdominal:   No nausea, vomiting, diarrhea, bright red blood per rectum, melena, or hematemesis Neurologic: Expressive aphasia, persisted R sided weakness and  left sided facial droop All other systems reviewed and are otherwise negative except as noted above.  Physical Exam    Blood pressure 106/59, pulse 85, temperature 99.6 F (37.6 C), temperature source Oral, resp. rate 22, height 6' (1.829 m), weight 77.3 kg (170 lb 6.7 oz), SpO2 96 %.  General: Pleasant, NAD Psych: Normal affect. Neuro: Alert. Moves all extremities spontaneously. HEENT: left facial droop  Neck: Supple without bruits or JVD. Lungs:  Resp regular and unlabored. Diffuse coarse breath sounds.  Heart: Irregularly irregular, tachycardic. No murmurs.  Abdomen: Soft, non-tender, non-distended, BS + x 4.  Extremities: No clubbing, cyanosis or edema. DP/PT/Radials 2+ and equal bilaterally. Neuro: mild RUE and RLE weakness (~4/5), left sided facial droop  Labs    Troponin (Point of Care Test)  Recent Labs  07/11/15 2206  TROPIPOC 0.00   No results for input(s): CKTOTAL, CKMB, TROPONINI in the last 72 hours. Lab Results  Component Value Date   WBC 7.9 07/13/2015   HGB 11.7* 07/13/2015   HCT 36.4* 07/13/2015   MCV 91.5 07/13/2015   PLT 160 07/13/2015    Recent Labs Lab 07/13/15 0826  NA 139  K 3.7  CL 104  CO2 25  BUN 12  CREATININE 0.92  CALCIUM 7.8*  PROT 5.1*  BILITOT 1.0  ALKPHOS 57  ALT 19  AST 39  GLUCOSE 101*   Lab Results  Component Value Date   CHOL 182 07/12/2015   HDL 21* 07/12/2015   LDLCALC 120* 07/12/2015   TRIG 209* 07/12/2015   TRIG 207* 07/12/2015   No results found for: [facility]   Radiology Studies    Ct Angio Head W/cm &/or Wo Cm  07/11/2015  CLINICAL DATA:  Code stroke, RIGHT facial droop. Slurred speech and uneven pupils. History of atrial fibrillation. EXAM: CT ANGIOGRAPHY HEAD TECHNIQUE: Multidetector CT imaging of the head was performed using the standard protocol during bolus administration of intravenous contrast. Multiplanar CT image reconstructions and MIPs were obtained to evaluate the vascular anatomy. CONTRAST:  78m  OMNIPAQUE IOHEXOL 350 MG/ML SOLN COMPARISON:  CT head July 11, 2015 at 2205 hours FINDINGS: CT HEAD Mildly dense LEFT MCA with LEFT insular ribbon sign. No intraparenchymal hemorrhage, mass effect, midline shift. Ventricles and sulci are overall normal for patient's age, cavum septum pellucidum is a normal variant. Subcentimeter density RIGHT parasagittal parietal lobe suggests developmental venous anomaly. No abnormal extra-axial fluid collections. Basal cisterns are patent. Ocular globes and orbital contents are normal. Paranasal sinuses and mastoid air cells are well aerated. No skull fracture. Multiple prominent vascular lakes at the convexity. Patient is edentulous. CTA NECK Aortic arch: Normal appearance of the thoracic arch, normal branch pattern. Mild calcific atherosclerosis of the aortic arch. The origins of the innominate, left Common carotid artery and subclavian artery are widely patent. Right carotid system: Common carotid artery is widely patent, coursing in a straight line fashion.  Normal appearance of the carotid bifurcation without hemodynamically significant stenosis by NASCET criteria. Normal appearance of the included internal carotid artery. Left carotid system: Common carotid artery is widely patent, coursing in a straight line fashion. Normal appearance of the carotid bifurcation without hemodynamically significant stenosis by NASCET criteria. Mild eccentric calcific atherosclerosis. Normal appearance of the included internal carotid artery. Vertebral arteries:Left vertebral artery is dominant. Normal appearance of the vertebral arteries, which appear widely patent. Skeleton: No acute osseous process though bone windows have not been submitted. Moderate to severe multilevel degenerative discs resulting in moderate to severe neural foraminal narrowing C3-4, C4-5. Old mild T2 compression fracture with scoliosis. No destructive bony lesions. Other neck: Soft tissues of the neck are nonacute  though, not tailored for evaluation. CTA HEAD Anterior circulation: Normal appearance of the cervical internal carotid arteries, petrous, cavernous and supra clinoid internal carotid arteries. Widely patent anterior communicating artery. LEFT A1 is developmentally dominant. Acute distal LEFT M1 segment occlusion. Minimal collateral present on CTA. RIGHT middle cerebral artery vessels are patent. Posterior circulation: Normal appearance of the vertebral arteries, vertebrobasilar junction and basilar artery, as well as main branch vessels. Normal appearance of the posterior cerebral arteries. Fetal origin RIGHT posterior cerebral artery. No hemodynamically significant stenosis, dissection, luminal irregularity, contrast extravasation or aneurysm within the anterior nor posterior circulation. 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 CTA neck:  Negative. CTA head: Emergent distal LEFT M1 occlusion. 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 Acute findings discussed with and reconfirmed by Dr.MCNEILL [facility] on 07/11/2015 at 10:23 pm. Electronically Signed   By: Elon Alas M.D.   On: 07/11/2015 23:00   Dg Chest 1 View  07/13/2015  CLINICAL DATA:  Pulmonary edema, history atrial fibrillation, palpitations, former smoker, stroke, dilated non ischemic cardiomyopathy EXAM: CHEST 1 VIEW COMPARISON:  Portable exam 0929 hours compared to 07/12/2015 FINDINGS: Tip of endotracheal tube projects 4.4 cm above carina. Nasogastric tube extends into stomach. Normal heart size, mediastinal contours, and pulmonary vascularity. Minimal RIGHT basilar atelectasis. Lungs otherwise clear. No pleural effusion or pneumothorax. Question RIGHT nipple shadow not seen prior chest radiograph. IMPRESSION: Minimal RIGHT basilar atelectasis. Electronically Signed   By: Lavonia Dana M.D.   On: 07/13/2015 09:47   Ct Head Wo  Contrast  07/13/2015  CLINICAL DATA:  Follow-up examination for stroke. Status post catheter directed revascularization with intra-arterial tPA. EXAM: CT HEAD WITHOUT CONTRAST TECHNIQUE: Contiguous axial images were obtained from the base of the skull through the vertex without intravenous contrast. COMPARISON:  Multiple previous studies from 07/11/2015 and 07/12/2015. FINDINGS: Age-related cerebral atrophy with chronic small vessel ischemic type changes again noted. There is evolving patchy hypodensity within the left MCA territory involving the left insular region with extension into the left lentiform nucleus, compatible with evolving left MCA territory infarct focal hypodensity within the left temporal region consistent with evolving ischemia. Probable additional patchy hypodensity with subtle loss of gray-white matter differentiation more superficially and posteriorly within the posterior left frontal lobe. No significant mass effect or evidence for hemorrhagic transformation. Previously seen dense left M1 segment no longer visualized. No other acute or evolving large vessel territory infarct. No intracranial hemorrhage. No mass lesion, midline shift, or mass effect. No hydrocephalus. No extra-axial fluid collection. Scalp soft tissues within normal limits. No acute abnormality about the orbits. Scattered mucosal thickening within the paranasal sinuses. Mastoid air cells grossly clear. Middle ear cavities clear. Calvarium intact. IMPRESSION:  1. Evolving patchy acute left MCA territory ischemic infarcts as above. No significant mass effect or evidence of hemorrhagic transformation. 2. No other new acute intracranial process. 3. 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 Electronically Signed   By: Jeannine Boga M.D.   On: 07/13/2015 01:53   Ct Head Wo Contrast  07/12/2015  CLINICAL DATA:  Follow-up LEFT middle cerebral artery stroke, status post LEFT middle cerebral endovascular  revascularization. EXAM: CT HEAD WITHOUT CONTRAST TECHNIQUE: Contiguous axial images were obtained from the base of the skull through the vertex without intravenous contrast. COMPARISON:  CT head July 11, 2015 FINDINGS: Faint contrast staining of LEFT posterior temporal lobe, improved visualization of insula gray-white matter junction. Postcontrast imaging without evidence of hemorrhage, mass effect or midline shift. Ventricles and sulci are normal for patient's age. Subcentimeter enhancement RIGHT parasagittal parietal lobe compatible with developmental venous anomaly. No abnormal extra-axial fluid collections. Minimal calcific atherosclerosis the carotid siphons. Old RIGHT medial orbital blowout fracture. Mild paranasal sinus mucosal thickening, life-support lines in place. Mastoid air cells are well aerated. Prominent vascular lakes at the vertex. IMPRESSION: Faint contrast staining LEFT posterior temporal lobe, with improved visualization gray-white matter junction, no hemorrhage. Electronically Signed   By: Elon Alas M.D.   On: 07/12/2015 02:05   Ct Head Wo Contrast  07/11/2015  CLINICAL DATA:  Code stroke. Right-sided facial droop and slurred speech. Uneven pupils. Initial encounter. EXAM: CT HEAD WITHOUT CONTRAST TECHNIQUE: Contiguous axial images were obtained from the base of the skull through the vertex without intravenous contrast. COMPARISON:  CT of the head performed 07-28-2014 FINDINGS: There is no evidence of acute infarction, mass lesion, or intra- or extra-axial hemorrhage on CT. Prominence of the ventricles and sulci suggests mild cortical volume loss. Scattered subcortical and periventricular white matter change likely reflects small vessel ischemic microangiopathy, relatively stable from the prior study. Mild cerebellar atrophy is noted. The brainstem and fourth ventricle are within normal limits. The basal ganglia are unremarkable in appearance. The cerebral hemispheres demonstrate  grossly normal gray-white differentiation. No mass effect or midline shift is seen. There is no evidence of fracture; chronic scattered calvarial lucencies are again noted at the vertex, relatively stable in appearance. The orbits are within normal limits. The paranasal sinuses and mastoid air cells are well-aerated. No significant soft tissue abnormalities are seen. IMPRESSION: 1. No acute intracranial pathology seen on CT. 2. Mild cortical volume loss and scattered small vessel ischemic microangiopathy. These results were called by telephone at the time of interpretation on 07/11/2015 at 10:18 pm to Dr. Leonel Ramsay, who verbally acknowledged these results. Electronically Signed   By: Garald Balding M.D.   On: 07/11/2015 22:21   Ct Angio Neck W/cm &/or Wo/cm  07/11/2015  CLINICAL DATA:  Code stroke, RIGHT facial droop. Slurred speech and uneven pupils. History of atrial fibrillation. EXAM: CT ANGIOGRAPHY HEAD TECHNIQUE: Multidetector CT imaging of the head was performed using the standard protocol during bolus administration of intravenous contrast. Multiplanar CT image reconstructions and MIPs were obtained to evaluate the vascular anatomy. CONTRAST:  96m OMNIPAQUE IOHEXOL 350 MG/ML SOLN COMPARISON:  CT head July 11, 2015 at 2205 hours FINDINGS: CT HEAD Mildly dense LEFT MCA with LEFT insular ribbon sign. No intraparenchymal hemorrhage, mass effect, midline shift. Ventricles and sulci are overall normal for patient's age, cavum septum pellucidum is a normal variant. Subcentimeter density RIGHT parasagittal parietal lobe suggests developmental venous anomaly. No abnormal extra-axial fluid collections. Basal cisterns are patent. Ocular globes and orbital  contents are normal. Paranasal sinuses and mastoid air cells are well aerated. No skull fracture. Multiple prominent vascular lakes at the convexity. Patient is edentulous. CTA NECK Aortic arch: Normal appearance of the thoracic arch, normal branch pattern. Mild  calcific atherosclerosis of the aortic arch. The origins of the innominate, left Common carotid artery and subclavian artery are widely patent. Right carotid system: Common carotid artery is widely patent, coursing in a straight line fashion. Normal appearance of the carotid bifurcation without hemodynamically significant stenosis by NASCET criteria. Normal appearance of the included internal carotid artery. Left carotid system: Common carotid artery is widely patent, coursing in a straight line fashion. Normal appearance of the carotid bifurcation without hemodynamically significant stenosis by NASCET criteria. Mild eccentric calcific atherosclerosis. Normal appearance of the included internal carotid artery. Vertebral arteries:Left vertebral artery is dominant. Normal appearance of the vertebral arteries, which appear widely patent. Skeleton: No acute osseous process though bone windows have not been submitted. Moderate to severe multilevel degenerative discs resulting in moderate to severe neural foraminal narrowing C3-4, C4-5. Old mild T2 compression fracture with scoliosis. No destructive bony lesions. Other neck: Soft tissues of the neck are nonacute though, not tailored for evaluation. CTA HEAD Anterior circulation: Normal appearance of the cervical internal carotid arteries, petrous, cavernous and supra clinoid internal carotid arteries. Widely patent anterior communicating artery. LEFT A1 is developmentally dominant. Acute distal LEFT M1 segment occlusion. Minimal collateral present on CTA. RIGHT middle cerebral artery vessels are patent. Posterior circulation: Normal appearance of the vertebral arteries, vertebrobasilar junction and basilar artery, as well as main branch vessels. Normal appearance of the posterior cerebral arteries. Fetal origin RIGHT posterior cerebral artery. No hemodynamically significant stenosis, dissection, luminal irregularity, contrast extravasation or aneurysm within the anterior  nor posterior circulation. 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 CTA neck:  Negative. CTA head: Emergent distal LEFT M1 occlusion. 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 Acute findings discussed with and reconfirmed by Dr.MCNEILL [facility] on 07/11/2015 at 10:23 pm. Electronically Signed   By: Elon Alas M.D.   On: 07/11/2015 23:00   Mr Brain Wo Contrast  07/13/2015  CLINICAL DATA:  Right-sided weakness and difficulty speaking EXAM: MRI HEAD WITHOUT CONTRAST MRA HEAD WITHOUT CONTRAST TECHNIQUE: Multiplanar, multiecho pulse sequences of the brain and surrounding structures were obtained without intravenous contrast. Angiographic images of the head were obtained using MRA technique without contrast. COMPARISON:  Multiple recent head CT FINDINGS: Motion degraded study which could obscure pathology. MRI HEAD FINDINGS Calvarium and upper cervical spine: No focal marrow signal abnormality. Orbits: Negative. Sinuses and Mastoids: Chronic sinusitis with left predominant moderate mucosal thickening. Bilateral mastoid fluid or mucosal thickening with clear nasopharynx. Brain: Restricted diffusion which is most confluent in the left MCA territory inferior division, including the posterior insula, posterior putamen, patchy deep white matter tracts, and lateral left temporal lobe. There is patchy infarcts along the high left frontal and low left parietal lobes. Well-developed cytotoxic edema without midline shift or herniation. No suspected hemorrhagic conversion; susceptibility artifact at the right splenium of the corpus callosum and at the base of the left temporal parietal infarct is attributed to developmental venous anomalies based on prior head CTs. No hydrocephalus. No evidence of major vessel occlusion. MRA HEAD FINDINGS Severely limited due to motion, with doubling or distortion of all vessels. This  could easily obscure stenosis after occlusion. An aneurysm would likely be missed. Fetal type right PCA.  No other notable anatomic variant. There is symmetric signal within the bilateral MCA branches with no residual narrowing seen at the site of recent left M1 occlusion. IMPRESSION: 1. Acute infarct of roughly 1/3 left MCA territory, most confluent in the inferior territory. No suspected hemorrhagic conversion. 2. Motion degraded study which particularly affects the MRA. The re- opened left M1 segment remains patent. No evidence of proximal stenosis or occlusion. 3. 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 Electronically Signed   By: Monte Fantasia M.D.   On: 07/13/2015 19:08   Portable Chest Xray  07/12/2015  CLINICAL DATA:  Endotracheal tube placement.  Initial encounter. EXAM: PORTABLE CHEST 1 VIEW COMPARISON:  Chest radiograph performed December 24, 202016 FINDINGS: The patient's endotracheal tube is seen ending 1-2 cm above the carina. This could be retracted 2 cm. Minimal bibasilar atelectasis is noted. Mild vascular congestion is noted. No pleural effusion or pneumothorax is seen. The cardiomediastinal silhouette is borderline normal in size. No acute osseous abnormalities are identified. IMPRESSION: 1. Endotracheal tube seen ending 1-2 cm above the carina. This could be retracted 2 cm. 2. Minimal bibasilar atelectasis noted. Mild vascular congestion seen. Electronically Signed   By: Garald Balding M.D.   On: 07/12/2015 02:51   Dg Abd Portable 1v  07/12/2015  CLINICAL DATA:  Orogastric tube placement.  Initial encounter. EXAM: PORTABLE ABDOMEN - 1 VIEW COMPARISON:  None. FINDINGS: The patient's enteric tube is noted ending overlying the body of the stomach. The side-port is noted about the fundus of the stomach. The visualized bowel gas pattern is unremarkable. Scattered air and stool filled loops of colon are seen; no abnormal dilatation of small bowel loops is  seen to suggest small bowel obstruction. No free intra-abdominal air is identified, though evaluation for free air is limited on a single supine view. The visualized osseous structures are within normal limits; the sacroiliac joints are unremarkable in appearance. The visualized lung bases are essentially clear. IMPRESSION: Enteric tube noted ending overlying the body of the stomach. Electronically Signed   By: Garald Balding M.D.   On: 07/12/2015 23:58   Mr Jodene Nam Head/brain Wo Cm  07/13/2015  CLINICAL DATA:  Right-sided weakness and difficulty speaking EXAM: MRI HEAD WITHOUT CONTRAST MRA HEAD WITHOUT CONTRAST TECHNIQUE: Multiplanar, multiecho pulse sequences of the brain and surrounding structures were obtained without intravenous contrast. Angiographic images of the head were obtained using MRA technique without contrast. COMPARISON:  Multiple recent head CT FINDINGS: Motion degraded study which could obscure pathology. MRI HEAD FINDINGS Calvarium and upper cervical spine: No focal marrow signal abnormality. Orbits: Negative. Sinuses and Mastoids: Chronic sinusitis with left predominant moderate mucosal thickening. Bilateral mastoid fluid or mucosal thickening with clear nasopharynx. Brain: Restricted diffusion which is most confluent in the left MCA territory inferior division, including the posterior insula, posterior putamen, patchy deep white matter tracts, and lateral left temporal lobe. There is patchy infarcts along the high left frontal and low left parietal lobes. Well-developed cytotoxic edema without midline shift or herniation. No suspected hemorrhagic conversion; susceptibility artifact at the right splenium of the corpus callosum and at the base of the left temporal parietal infarct is attributed to developmental venous anomalies based on prior head CTs. No hydrocephalus. No evidence of major vessel occlusion. MRA HEAD FINDINGS Severely limited due to motion, with doubling or distortion of all  vessels. This could easily obscure stenosis after occlusion. An aneurysm would likely be missed. Fetal type right PCA.  No other notable  anatomic variant. There is symmetric signal within the bilateral MCA branches with no residual narrowing seen at the site of recent left M1 occlusion. IMPRESSION: 1. Acute infarct of roughly 1/3 left MCA territory, most confluent in the inferior territory. No suspected hemorrhagic conversion. 2. Motion degraded study which particularly affects the MRA. The re- opened left M1 segment remains patent. No evidence of proximal stenosis or occlusion. 3. 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 Electronically Signed   By: Monte Fantasia M.D.   On: 07/13/2015 19:08    ECG & Cardiac Imaging    07/13/15 TTE Study Conclusions  - Left ventricle: The cavity size was normal. Systolic function was mildly reduced. The estimated ejection fraction was in the range of 45% to 50%. There is akinesis of the basal-midinferolateral myocardium. Doppler parameters are consistent with abnormal left ventricular relaxation (grade 1 diastolic dysfunction). - Aortic valve: There was trivial regurgitation. - Mitral valve: There was mild regurgitation.  Impressions:  - EF improved from prior. No cardiac source of emboli was indentified.   07/23/14 Cardiac Cath Hemodynamics: Central Aortic Pressure / Mean Aortic Pressure: 113/64 LV Pressure / LV End diastolic Pressure: 7  Left Ventriculography: EF: 35-40% Wall Motion: global hypokinesis  Right Heart Data:  RA - 2  RV - 22/2  PA - 20/5 (12)   PCWP - 4  TPG - 8  FCO/CI - 3.92 L/min, 2.24 L/min  TDCO/CI - 5.94 L/min, 3.39 L/min  SVR - 22 Wood units ( dynscm?5/80)  PVR - 2.04 Wood units ( dynscm?5/80)  PA Sat% - 66  AO Sat% - 97  Coronary Angiographic Data:  Left Main: Normal  Left Anterior Descending  (LAD): Angiographically normal - smaller caliber, tapers around the apex.  1st diagonal (D1): Moderate-sized vessel, no stenosis.  2nd diagonal (D2): Small to moderate-sized, no stenosis.  Circumflex (LCx): Angiographically normal, follows the AV groove and gives off a single mid-vessel OM vessel.  1st obtuse marginal: Angiographically normal  Right Coronary Artery: Dominant vessel. Larger caliber. No stenosis.  right ventricle branch of right coronary artery: normal  posterior descending artery: No stenosis  posterior lateral branch: No stenosis  Impression: 1. Angiographically normal coronary arteries 2. Severe global hypokinesis, LVEF 35-40% 3. Compensated right and left heart filling pressures 4. Moderately decreased cardiac output  Assessment & Plan    74M with pAF and HFrEF (EF nadir 35-40% with improvement to 40-45%) who presents with an acute ischemic stroke on 07/11/15 and developed AF with RVR.  It is probable that his stroke was AF related.  His CHADSVASC is now 4 (CHF, age, CVA) and requires systemic anticoagulation once safe from a neurologic perspective. He remains in AF with RVR but treatment options are limited given (1) mild hypotension on neosynephrine with need for adequate BP in post stroke period and (2) inability to pursue rhythm control strategy (e.g. Amiodarone) as he is not on anticoagulation at this time and very well may have LAA clot. Although he has a history of CHF, he has been well compensated and appears compensated now. He may in fact be a bit volume depleted as he has been NPO all day.   1. Would initiation systemic anticoagulation when safe from a neurologic perspective 2. No amiodarone give risk for recurrent stroke with AF to NSR conversion, particularly off of anticoagulation  3. Consider increasing IVF as mild hypovolemia may be contributing to hypotension and degree of RVR. Neosyneprhine is the most desirable pressor  given its reflex  bradycardia.  4. No AV nodal agents at this point as we need to prioritize adequate BP to allow for cerebral perfusion. His symptoms of palpitations are mild and he is not in failure so it is reasonable to tolerate tachycardia.   Tobi Bastos, MD 07/13/2015, 7:46 PM     Spoke with Dr. Leonel Ramsay. He informed me that the patient is not a candidate for systemic anticoagulation for 1-2 weeks. Will try NS to improve BP and HR.

## 2015-07-13 NOTE — Progress Notes (Addendum)
PULMONARY  / CRITICAL CARE MEDICINE CONSULTATION   Name: Phillip Solis MRN: [id number] DOB: 1945/11/07    ADMISSION DATE:  07/11/2015 CONSULTATION DATE: July 13, 2015  REQUESTING CLINICIAN: Greta Doom, MD PRIMARY SERVICE: [facility]  CHIEF COMPLAINT:  Altered Mental Status  BRIEF PATIENT DESCRIPTION: 70 y/o man with hx of CHF admitted 3/3 with ischemic stroke now s/p IR clot retrieval.  SIGNIFICANT EVENTS / STUDIES:  3/3>>> IR directed mechanical clot retrieval 3/5 CT head>>>  1. Evolving patchy acute left MCA territory ischemic infarcts as above. No significant mass effect or evidence of hemorrhagic transformation.  2. No other new acute intracranial process.  3. 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  LINES / TUBES: ETT 3/3>>>  CULTURES: None  ANTIBIOTICS: None   SUBJECTIVE:  More alert. Sheath out.    VITAL SIGNS: Temp:  [98.1 F (36.7 C)-100.2 F (37.9 C)] 100.2 F (37.9 C) (03/05 0739) Pulse Rate:  [51-90] 66 (03/05 0915) Resp:  [13-27] 14 (03/05 0915) BP: (73-150)/(50-94) 96/56 mmHg (03/05 0915) SpO2:  [99 %-100 %] 100 % (03/05 0915) Arterial Line BP: (93-155)/(50-96) 115/61 mmHg (03/04 1430) FiO2 (%):  [40 %] 40 % (03/05 0800) Weight:  [77.3 kg (170 lb 6.7 oz)] 77.3 kg (170 lb 6.7 oz) (03/05 0403) HEMODYNAMICS:   VENTILATOR SETTINGS: Vent Mode:  [-] PRVC FiO2 (%):  [40 %] 40 % Set Rate:  [14 bmp] 14 bmp Vt Set:  [600 mL] 600 mL PEEP:  [5 cmH20] 5 cmH20 Plateau Pressure:  [10 cmH20-18 cmH20] 10 cmH20 INTAKE / OUTPUT: Intake/Output      03/04 0701 - 03/05 0700 03/05 0701 - 03/06 0700   I.V. (mL/kg) 3724.9 (48.2) 120 (1.6)   NG/GT 123 30   Total Intake(mL/kg) 3847.9 (49.8) 150 (1.9)   Urine (mL/kg/hr) 3215 (1.7)    Blood     Total Output 3215     Net +632.9 +150          PHYSICAL EXAMINATION: General:  NAD on vent  Neuro:  Drowsy but arousable, follows simple commands but takes much prompting.   HEENT:  MMM,  ETT Neck: No LAD Cardiovascular:  Heart sounds dual and normal. Regular rate. Lungs: resps even non labored on PS 5/5, few scattered rhonchi Abdomen:  Soft Musculoskeletal:  No swollen joints Skin:  No rashes.  LABS:  CBC  Recent Labs Lab 07/11/15 2152 07/11/15 2208 07/12/15 0558  WBC 7.1  --  8.3  HGB 13.8 14.3 12.1*  HCT 40.4 42.0 35.2*  PLT 178  --  160   Coag's  Recent Labs Lab 07/11/15 2152  APTT 25  INR 0.96   BMET  Recent Labs Lab 07/11/15 2152 07/11/15 2208 07/12/15 0558  NA 137 140 138  K 4.2 4.2 4.0  CL 105 106 110  CO2 21*  --  19*  BUN '18 19 13  '$ CREATININE 1.04 1.00 0.88  GLUCOSE 134* 130* 106*   Electrolytes  Recent Labs Lab 07/11/15 2152 07/12/15 0558  CALCIUM 8.5* 8.0*   Sepsis Markers No results for input(s): LATICACIDVEN, PROCALCITON, O2SATVEN in the last 168 hours. ABG  Recent Labs Lab 07/12/15 0425  PHART 7.427  PCO2ART 28.8*  PO2ART 175*   Liver Enzymes  Recent Labs Lab 07/11/15 2152  AST 22  ALT 14*  ALKPHOS 62  BILITOT 0.4  ALBUMIN 3.3*   Cardiac Enzymes No results for input(s): TROPONINI, PROBNP in the last 168 hours. Glucose  Recent Labs Lab 07/11/15 2223  07/12/15 2019 07/13/15 0008 07/13/15 0418 07/13/15 0835  GLUCAP 100* 106* 76 79 73    Imaging Ct Angio Head W/cm &/or Wo Cm  07/11/2015  CLINICAL DATA:  Code stroke, RIGHT facial droop. Slurred speech and uneven pupils. History of atrial fibrillation. EXAM: CT ANGIOGRAPHY HEAD TECHNIQUE: Multidetector CT imaging of the head was performed using the standard protocol during bolus administration of intravenous contrast. Multiplanar CT image reconstructions and MIPs were obtained to evaluate the vascular anatomy. CONTRAST:  32m OMNIPAQUE IOHEXOL 350 MG/ML SOLN COMPARISON:  CT head July 11, 2015 at 2205 hours FINDINGS: CT HEAD Mildly dense LEFT MCA with LEFT insular ribbon sign. No intraparenchymal hemorrhage, mass effect, midline shift. Ventricles and  sulci are overall normal for patient's age, cavum septum pellucidum is a normal variant. Subcentimeter density RIGHT parasagittal parietal lobe suggests developmental venous anomaly. No abnormal extra-axial fluid collections. Basal cisterns are patent. Ocular globes and orbital contents are normal. Paranasal sinuses and mastoid air cells are well aerated. No skull fracture. Multiple prominent vascular lakes at the convexity. Patient is edentulous. CTA NECK Aortic arch: Normal appearance of the thoracic arch, normal branch pattern. Mild calcific atherosclerosis of the aortic arch. The origins of the innominate, left Common carotid artery and subclavian artery are widely patent. Right carotid system: Common carotid artery is widely patent, coursing in a straight line fashion. Normal appearance of the carotid bifurcation without hemodynamically significant stenosis by NASCET criteria. Normal appearance of the included internal carotid artery. Left carotid system: Common carotid artery is widely patent, coursing in a straight line fashion. Normal appearance of the carotid bifurcation without hemodynamically significant stenosis by NASCET criteria. Mild eccentric calcific atherosclerosis. Normal appearance of the included internal carotid artery. Vertebral arteries:Left vertebral artery is dominant. Normal appearance of the vertebral arteries, which appear widely patent. Skeleton: No acute osseous process though bone windows have not been submitted. Moderate to severe multilevel degenerative discs resulting in moderate to severe neural foraminal narrowing C3-4, C4-5. Old mild T2 compression fracture with scoliosis. No destructive bony lesions. Other neck: Soft tissues of the neck are nonacute though, not tailored for evaluation. CTA HEAD Anterior circulation: Normal appearance of the cervical internal carotid arteries, petrous, cavernous and supra clinoid internal carotid arteries. Widely patent anterior communicating  artery. LEFT A1 is developmentally dominant. Acute distal LEFT M1 segment occlusion. Minimal collateral present on CTA. RIGHT middle cerebral artery vessels are patent. Posterior circulation: Normal appearance of the vertebral arteries, vertebrobasilar junction and basilar artery, as well as main branch vessels. Normal appearance of the posterior cerebral arteries. Fetal origin RIGHT posterior cerebral artery. No hemodynamically significant stenosis, dissection, luminal irregularity, contrast extravasation or aneurysm within the anterior nor posterior circulation. 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 CTA neck:  Negative. CTA head: Emergent distal LEFT M1 occlusion. 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 Acute findings discussed with and re[facility] 07/11/2015 at 10:23 pm. Electronically Signed   By: CElon AlasM.D.   On: 07/11/2015 23:00   Ct Head Wo Contrast  07/13/2015  CLINICAL DATA:  Follow-up examination for stroke. Status post catheter directed revascularization with intra-arterial tPA. EXAM: CT HEAD WITHOUT CONTRAST TECHNIQUE: Contiguous axial images were obtained from the base of the skull through the vertex without intravenous contrast. COMPARISON:  Multiple previous studies from 07/11/2015 and 07/12/2015. FINDINGS: Age-related cerebral atrophy with chronic small vessel ischemic type changes again noted. There is evolving patchy  hypodensity within the left MCA territory involving the left insular region with extension into the left lentiform nucleus, compatible with evolving left MCA territory infarct focal hypodensity within the left temporal region consistent with evolving ischemia. Probable additional patchy hypodensity with subtle loss of gray-white matter differentiation more superficially and posteriorly within the posterior left frontal lobe. No  significant mass effect or evidence for hemorrhagic transformation. Previously seen dense left M1 segment no longer visualized. No other acute or evolving large vessel territory infarct. No intracranial hemorrhage. No mass lesion, midline shift, or mass effect. No hydrocephalus. No extra-axial fluid collection. Scalp soft tissues within normal limits. No acute abnormality about the orbits. Scattered mucosal thickening within the paranasal sinuses. Mastoid air cells grossly clear. Middle ear cavities clear. Calvarium intact. IMPRESSION: 1. Evolving patchy acute left MCA territory ischemic infarcts as above. No significant mass effect or evidence of hemorrhagic transformation. 2. No other new acute intracranial process. 3. 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 Electronically Signed   By: Jeannine Boga M.D.   On: 07/13/2015 01:53   Ct Head Wo Contrast  07/12/2015  CLINICAL DATA:  Follow-up LEFT middle cerebral artery stroke, status post LEFT middle cerebral endovascular revascularization. EXAM: CT HEAD WITHOUT CONTRAST TECHNIQUE: Contiguous axial images were obtained from the base of the skull through the vertex without intravenous contrast. COMPARISON:  CT head July 11, 2015 FINDINGS: Faint contrast staining of LEFT posterior temporal lobe, improved visualization of insula gray-white matter junction. Postcontrast imaging without evidence of hemorrhage, mass effect or midline shift. Ventricles and sulci are normal for patient's age. Subcentimeter enhancement RIGHT parasagittal parietal lobe compatible with developmental venous anomaly. No abnormal extra-axial fluid collections. Minimal calcific atherosclerosis the carotid siphons. Old RIGHT medial orbital blowout fracture. Mild paranasal sinus mucosal thickening, life-support lines in place. Mastoid air cells are well aerated. Prominent vascular lakes at the vertex. IMPRESSION: Faint contrast staining LEFT posterior temporal lobe, with  improved visualization gray-white matter junction, no hemorrhage. Electronically Signed   By: Elon Alas M.D.   On: 07/12/2015 02:05   Ct Head Wo Contrast  07/11/2015  CLINICAL DATA:  Code stroke. Right-sided facial droop and slurred speech. Uneven pupils. Initial encounter. EXAM: CT HEAD WITHOUT CONTRAST TECHNIQUE: Contiguous axial images were obtained from the base of the skull through the vertex without intravenous contrast. COMPARISON:  CT of the head performed 03/29/2015 FINDINGS: There is no evidence of acute infarction, mass lesion, or intra- or extra-axial hemorrhage on CT. Prominence of the ventricles and sulci suggests mild cortical volume loss. Scattered subcortical and periventricular white matter change likely reflects small vessel ischemic microangiopathy, relatively stable from the prior study. Mild cerebellar atrophy is noted. The brainstem and fourth ventricle are within normal limits. The basal ganglia are unremarkable in appearance. The cerebral hemispheres demonstrate grossly normal gray-white differentiation. No mass effect or midline shift is seen. There is no evidence of fracture; chronic scattered calvarial lucencies are again noted at the vertex, relatively stable in appearance. The orbits are within normal limits. The paranasal sinuses and mastoid air cells are well-aerated. No significant soft tissue abnormalities are seen. IMPRESSION: 1. No acute intracranial pathology seen on CT. 2. Mild cortical volume loss and scattered small vessel ischemic microangiopathy. These results were called by telephone at the time of interpretation on 07/11/2015 at 10:18 pm to Dr. Leonel Ramsay, who verbally acknowledged these results. Electronically Signed   By: Garald Balding M.D.   On: 07/11/2015 22:21   Ct Angio Neck W/cm &/or Wo/cm  07/11/2015  CLINICAL DATA:  Code stroke, RIGHT facial droop. Slurred speech and uneven pupils. History of atrial fibrillation. EXAM: CT ANGIOGRAPHY HEAD TECHNIQUE:  Multidetector CT imaging of the head was performed using the standard protocol during bolus administration of intravenous contrast. Multiplanar CT image reconstructions and MIPs were obtained to evaluate the vascular anatomy. CONTRAST:  25m OMNIPAQUE IOHEXOL 350 MG/ML SOLN COMPARISON:  CT head July 11, 2015 at 2205 hours FINDINGS: CT HEAD Mildly dense LEFT MCA with LEFT insular ribbon sign. No intraparenchymal hemorrhage, mass effect, midline shift. Ventricles and sulci are overall normal for patient's age, cavum septum pellucidum is a normal variant. Subcentimeter density RIGHT parasagittal parietal lobe suggests developmental venous anomaly. No abnormal extra-axial fluid collections. Basal cisterns are patent. Ocular globes and orbital contents are normal. Paranasal sinuses and mastoid air cells are well aerated. No skull fracture. Multiple prominent vascular lakes at the convexity. Patient is edentulous. CTA NECK Aortic arch: Normal appearance of the thoracic arch, normal branch pattern. Mild calcific atherosclerosis of the aortic arch. The origins of the innominate, left Common carotid artery and subclavian artery are widely patent. Right carotid system: Common carotid artery is widely patent, coursing in a straight line fashion. Normal appearance of the carotid bifurcation without hemodynamically significant stenosis by NASCET criteria. Normal appearance of the included internal carotid artery. Left carotid system: Common carotid artery is widely patent, coursing in a straight line fashion. Normal appearance of the carotid bifurcation without hemodynamically significant stenosis by NASCET criteria. Mild eccentric calcific atherosclerosis. Normal appearance of the included internal carotid artery. Vertebral arteries:Left vertebral artery is dominant. Normal appearance of the vertebral arteries, which appear widely patent. Skeleton: No acute osseous process though bone windows have not been submitted. Moderate  to severe multilevel degenerative discs resulting in moderate to severe neural foraminal narrowing C3-4, C4-5. Old mild T2 compression fracture with scoliosis. No destructive bony lesions. Other neck: Soft tissues of the neck are nonacute though, not tailored for evaluation. CTA HEAD Anterior circulation: Normal appearance of the cervical internal carotid arteries, petrous, cavernous and supra clinoid internal carotid arteries. Widely patent anterior communicating artery. LEFT A1 is developmentally dominant. Acute distal LEFT M1 segment occlusion. Minimal collateral present on CTA. RIGHT middle cerebral artery vessels are patent. Posterior circulation: Normal appearance of the vertebral arteries, vertebrobasilar junction and basilar artery, as well as main branch vessels. Normal appearance of the posterior cerebral arteries. Fetal origin RIGHT posterior cerebral artery. No hemodynamically significant stenosis, dissection, luminal irregularity, contrast extravasation or aneurysm within the anterior nor posterior circulation. 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 CTA neck:  Negative. CTA head: Emergent distal LEFT M1 occlusion. 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 Acute findings discussed with and reconfirmed by Dr[facility] 07/11/2015 at 10:23 pm. Electronically Signed   By: CElon AlasM.D.   On: 07/11/2015 23:00   Portable Chest Xray  07/12/2015  CLINICAL DATA:  Endotracheal tube placement.  Initial encounter. EXAM: PORTABLE CHEST 1 VIEW COMPARISON:  Chest radiograph performed 03-11-2015 FINDINGS: The patient's endotracheal tube is seen ending 1-2 cm above the carina. This could be retracted 2 cm. Minimal bibasilar atelectasis is noted. Mild vascular congestion is noted. No pleural effusion or pneumothorax is seen. The cardiomediastinal silhouette is borderline normal in size. No  acute osseous abnormalities are identified. IMPRESSION: 1. Endotracheal tube seen ending 1-2 cm above the carina. This could be retracted 2 cm. 2. Minimal bibasilar atelectasis  noted. Mild vascular congestion seen. Electronically Signed   By: Garald Balding M.D.   On: 07/12/2015 02:51   Dg Abd Portable 1v  07/12/2015  CLINICAL DATA:  Orogastric tube placement.  Initial encounter. EXAM: PORTABLE ABDOMEN - 1 VIEW COMPARISON:  None. FINDINGS: The patient's enteric tube is noted ending overlying the body of the stomach. The side-port is noted about the fundus of the stomach. The visualized bowel gas pattern is unremarkable. Scattered air and stool filled loops of colon are seen; no abnormal dilatation of small bowel loops is seen to suggest small bowel obstruction. No free intra-abdominal air is identified, though evaluation for free air is limited on a single supine view. The visualized osseous structures are within normal limits; the sacroiliac joints are unremarkable in appearance. The visualized lung bases are essentially clear. IMPRESSION: Enteric tube noted ending overlying the body of the stomach. Electronically Signed   By: Garald Balding M.D.   On: 07/12/2015 23:58    ASSESSMENT / PLAN:  NEUROLOGIC A: Acute Ischemic Stroke s/p tPA and catheter-directed clot retrieval. P:   Per neuro  MRI brain pending   PULMONARY A: Need for mechanical ventilation post neuro IR P:   Vent support - 8cc/kg  F/u CXR  F/u ABG Daily SBT, WUA  Ideally could do MRI while intubated/sedated but agitation has been an issue (likely r/t ETT) and may improve post extubation - will cont PS wean and reassess for potential extubation.    CARDIOVASCULAR A: Hx CHF - EF35% PAF - one time, in setting procedure, not on anticoag P:   Low dose neo gtt to maintain map goals -- Keep SBP 120-140 Echo pending   RENAL A: No active issue  P:   Chem in am   GASTROINTESTINAL A: No active issue  P:   TF  PPI    HEMATOLOGIC No active issues P:   F/u cbc  SCD's  INFECTIOUS A: No active issues P:   Monitor wbc, fever curve off abx   ENDOCRINE A: DM2 P:   Will monitor glucose    Nickolas Madrid, NP 07/13/2015  9:30 AM Pager: [phone] or [phone]    Addendum : i entered date in error. Pt was seen on 07/13/15 at 4 pm.

## 2015-07-13 NOTE — Procedures (Signed)
Extubation Procedure Note  Patient Details:   Name: Phillip Solis DOB: [birth year] MRN: [id number]   Airway Documentation:     Evaluation  O2 sats: stable throughout Complications: No apparent complications Patient did tolerate procedure well. Bilateral Breath Sounds: Rhonchi Suctioning: Airway Yes. Extubated per Dr.'s orders.  Vital signs stable  Aniyah Nobis V 07/13/2015, 10:44 AM

## 2015-07-13 NOTE — Progress Notes (Signed)
Utilization Review Completed.Phillip Solis T3/09/2015  

## 2015-07-13 NOTE — Progress Notes (Signed)
[facility] Progress Note Patient Name: Phillip Solis DOB: 1945/05/30 MRN: [id number]   Date of Service  07/13/2015  HPI/Events of Note  AF-RVR on return from MRI On neo gtt- being tapered  eICU Interventions  Cards called - May need anticoagulation      Intervention Category Major Interventions: Arrhythmia - evaluation and management  ALVA,RAKESH V. 07/13/2015, 7:42 PM

## 2015-07-13 NOTE — Progress Notes (Signed)
SLP Cancellation Note  Patient Details Name: Phillip Solis MRN: [id number] DOB: 1945-12-05   Cancelled treatment:       Reason Eval/Treat Not Completed: Medical issues which prohibited therapy ST will follow up tomorrow in attempt to complete BSE and speech/language evaluation.  Shelly Flatten, [place], [facility] SLP [phone]

## 2015-07-14 ENCOUNTER — Encounter (HOSPITAL_COMMUNITY): Payer: Self-pay | Admitting: Cardiology

## 2015-07-14 ENCOUNTER — Inpatient Hospital Stay (HOSPITAL_COMMUNITY): Payer: Medicare HMO

## 2015-07-14 DIAGNOSIS — D696 Thrombocytopenia, unspecified: Secondary | ICD-10-CM

## 2015-07-14 DIAGNOSIS — I4891 Unspecified atrial fibrillation: Secondary | ICD-10-CM

## 2015-07-14 DIAGNOSIS — I63412 Cerebral infarction due to embolism of left middle cerebral artery: Principal | ICD-10-CM

## 2015-07-14 DIAGNOSIS — I69391 Dysphagia following cerebral infarction: Secondary | ICD-10-CM

## 2015-07-14 DIAGNOSIS — R0682 Tachypnea, not elsewhere classified: Secondary | ICD-10-CM | POA: Insufficient documentation

## 2015-07-14 DIAGNOSIS — I69320 Aphasia following cerebral infarction: Secondary | ICD-10-CM | POA: Insufficient documentation

## 2015-07-14 DIAGNOSIS — D62 Acute posthemorrhagic anemia: Secondary | ICD-10-CM | POA: Insufficient documentation

## 2015-07-14 DIAGNOSIS — I69359 Hemiplegia and hemiparesis following cerebral infarction affecting unspecified side: Secondary | ICD-10-CM

## 2015-07-14 DIAGNOSIS — I639 Cerebral infarction, unspecified: Secondary | ICD-10-CM | POA: Insufficient documentation

## 2015-07-14 DIAGNOSIS — R000 Tachycardia, unspecified: Secondary | ICD-10-CM | POA: Insufficient documentation

## 2015-07-14 DIAGNOSIS — I5022 Chronic systolic (congestive) heart failure: Secondary | ICD-10-CM

## 2015-07-14 DIAGNOSIS — I5042 Chronic combined systolic (congestive) and diastolic (congestive) heart failure: Secondary | ICD-10-CM | POA: Insufficient documentation

## 2015-07-14 HISTORY — DX: Unspecified atrial fibrillation: I48.91

## 2015-07-14 LAB — GLUCOSE, CAPILLARY
GLUCOSE-CAPILLARY: 99 mg/dL (ref 65–99)
Glucose-Capillary: 100 mg/dL — ABNORMAL HIGH (ref 65–99)
Glucose-Capillary: 142 mg/dL — ABNORMAL HIGH (ref 65–99)

## 2015-07-14 LAB — BASIC METABOLIC PANEL
ANION GAP: 6 (ref 5–15)
BUN: 8 mg/dL (ref 6–20)
CALCIUM: 7.9 mg/dL — AB (ref 8.9–10.3)
CO2: 24 mmol/L (ref 22–32)
Chloride: 115 mmol/L — ABNORMAL HIGH (ref 101–111)
Creatinine, Ser: 0.88 mg/dL (ref 0.61–1.24)
GLUCOSE: 107 mg/dL — AB (ref 65–99)
POTASSIUM: 3.8 mmol/L (ref 3.5–5.1)
Sodium: 145 mmol/L (ref 135–145)

## 2015-07-14 LAB — CBC
HEMATOCRIT: 33.4 % — AB (ref 39.0–52.0)
Hemoglobin: 10.8 g/dL — ABNORMAL LOW (ref 13.0–17.0)
MCH: 29.6 pg (ref 26.0–34.0)
MCHC: 32.3 g/dL (ref 30.0–36.0)
MCV: 91.5 fL (ref 78.0–100.0)
Platelets: 142 10*3/uL — ABNORMAL LOW (ref 150–400)
RBC: 3.65 MIL/uL — ABNORMAL LOW (ref 4.22–5.81)
RDW: 14.4 % (ref 11.5–15.5)
WBC: 7.3 10*3/uL (ref 4.0–10.5)

## 2015-07-14 LAB — HEMOGLOBIN A1C
Hgb A1c MFr Bld: 5.9 % — ABNORMAL HIGH (ref 4.8–5.6)
MEAN PLASMA GLUCOSE: 123 mg/dL

## 2015-07-14 MED ORDER — APIXABAN 5 MG PO TABS
5.0000 mg | ORAL_TABLET | Freq: Two times a day (BID) | ORAL | Status: DC
  Administered 2015-07-14 – 2015-07-18 (×9): 5 mg via ORAL
  Filled 2015-07-14 (×9): qty 1

## 2015-07-14 MED ORDER — RESOURCE THICKENUP CLEAR PO POWD
ORAL | Status: DC | PRN
  Filled 2015-07-14: qty 125

## 2015-07-14 MED ORDER — DIGOXIN 250 MCG PO TABS: 0.2500 mg | ORAL_TABLET | Freq: Every day | ORAL | Status: DC

## 2015-07-14 MED ORDER — DIGOXIN 0.25 MG/ML IJ SOLN
0.2500 mg | Freq: Four times a day (QID) | INTRAMUSCULAR | Status: AC
  Administered 2015-07-14 – 2015-07-15 (×4): 0.25 mg via INTRAVENOUS
  Filled 2015-07-14 (×4): qty 2

## 2015-07-14 NOTE — Progress Notes (Signed)
SUBJECTIVE:  No complaints  OBJECTIVE:   Vitals:   Filed Vitals:   07/14/15 0530 07/14/15 0600 07/14/15 0630 07/14/15 0700  BP: 99/63 110/79 112/89 106/68  Pulse: 130 138 145 123  Temp:      TempSrc:      Resp: '20 21 19 18  '$ Height:      Weight:      SpO2: 94% 95% 95% 94%   I&O's:   Intake/Output Summary (Last 24 hours) at 07/14/15 0803 Last data filed at 07/14/15 0600  Gross per 24 hour  Intake 3180.3 ml  Output   1300 ml  Net 1880.3 ml   TELEMETRY: Reviewed telemetry pt in atrial fibrillation with RVR     PHYSICAL EXAM General: Well developed, well nourished, in no acute distress Head: Eyes PERRLA, No xanthomas.   Normal cephalic and atramatic  Lungs:   Clear bilaterally to auscultation and percussion. Heart:   Irregularly irregular S1 S2 Pulses are 2+ & equal. Abdomen: Bowel sounds are positive, abdomen soft and non-tender without masses Extremities:   No clubbing, cyanosis or edema.  DP +1 Neuro: Alert and oriented X 3. Psych:  Good affect, responds appropriately   LABS: Basic Metabolic Panel:  Recent Labs  07/13/15 0826 07/14/15 0409  NA 139 145  K 3.7 3.8  CL 104 115*  CO2 25 24  GLUCOSE 101* 107*  BUN 12 8  CREATININE 0.92 0.88  CALCIUM 7.8* 7.9*   Liver Function Tests:  Recent Labs  07/11/15 2152 07/13/15 0826  AST 22 39  ALT 14* 19  ALKPHOS 62 57  BILITOT 0.4 1.0  PROT 5.9* 5.1*  ALBUMIN 3.3* 2.7*   No results for input(s): LIPASE, AMYLASE in the last 72 hours. CBC:  Recent Labs  07/11/15 2152  07/12/15 0558 07/13/15 0826 07/14/15 0409  WBC 7.1  --  8.3 7.9 7.3  NEUTROABS 2.8  --  6.3  --   --   HGB 13.8  < > 12.1* 11.7* 10.8*  HCT 40.4  < > 35.2* 36.4* 33.4*  MCV 90.8  --  88.9 91.5 91.5  PLT 178  --  160 160 142*  < > = values in this interval not displayed. Cardiac Enzymes: No results for input(s): CKTOTAL, CKMB, CKMBINDEX, TROPONINI in the last 72 hours. BNP: Invalid input(s): POCBNP D-Dimer: No results for  input(s): DDIMER in the last 72 hours. Hemoglobin A1C: No results for input(s): HGBA1C in the last 72 hours. Fasting Lipid Panel:  Recent Labs  07/12/15 0558  CHOL 182  HDL 21*  LDLCALC 120*  TRIG 207*  209*  CHOLHDL 8.7   Thyroid Function Tests: No results for input(s): TSH, T4TOTAL, T3FREE, THYROIDAB in the last 72 hours.  Invalid input(s): FREET3 Anemia Panel: No results for input(s): VITAMINB12, FOLATE, FERRITIN, TIBC, IRON, RETICCTPCT in the last 72 hours. Coag Panel:   Lab Results  Component Value Date   INR 0.96 07/11/2015   INR 0.99 07/15/2014    RADIOLOGY: Ct Angio Head W/cm &/or Wo Cm  07/11/2015  CLINICAL DATA:  Code stroke, RIGHT facial droop. Slurred speech and uneven pupils. History of atrial fibrillation. EXAM: CT ANGIOGRAPHY HEAD TECHNIQUE: Multidetector CT imaging of the head was performed using the standard protocol during bolus administration of intravenous contrast. Multiplanar CT image reconstructions and MIPs were obtained to evaluate the vascular anatomy. CONTRAST:  6m OMNIPAQUE IOHEXOL 350 MG/ML SOLN COMPARISON:  CT head July 11, 2015 at 2205 hours FINDINGS: CT HEAD Mildly dense LEFT  MCA with LEFT insular ribbon sign. No intraparenchymal hemorrhage, mass effect, midline shift. Ventricles and sulci are overall normal for patient's age, cavum septum pellucidum is a normal variant. Subcentimeter density RIGHT parasagittal parietal lobe suggests developmental venous anomaly. No abnormal extra-axial fluid collections. Basal cisterns are patent. Ocular globes and orbital contents are normal. Paranasal sinuses and mastoid air cells are well aerated. No skull fracture. Multiple prominent vascular lakes at the convexity. Patient is edentulous. CTA NECK Aortic arch: Normal appearance of the thoracic arch, normal branch pattern. Mild calcific atherosclerosis of the aortic arch. The origins of the innominate, left Common carotid artery and subclavian artery are widely  patent. Right carotid system: Common carotid artery is widely patent, coursing in a straight line fashion. Normal appearance of the carotid bifurcation without hemodynamically significant stenosis by NASCET criteria. Normal appearance of the included internal carotid artery. Left carotid system: Common carotid artery is widely patent, coursing in a straight line fashion. Normal appearance of the carotid bifurcation without hemodynamically significant stenosis by NASCET criteria. Mild eccentric calcific atherosclerosis. Normal appearance of the included internal carotid artery. Vertebral arteries:Left vertebral artery is dominant. Normal appearance of the vertebral arteries, which appear widely patent. Skeleton: No acute osseous process though bone windows have not been submitted. Moderate to severe multilevel degenerative discs resulting in moderate to severe neural foraminal narrowing C3-4, C4-5. Old mild T2 compression fracture with scoliosis. No destructive bony lesions. Other neck: Soft tissues of the neck are nonacute though, not tailored for evaluation. CTA HEAD Anterior circulation: Normal appearance of the cervical internal carotid arteries, petrous, cavernous and supra clinoid internal carotid arteries. Widely patent anterior communicating artery. LEFT A1 is developmentally dominant. Acute distal LEFT M1 segment occlusion. Minimal collateral present on CTA. RIGHT middle cerebral artery vessels are patent. Posterior circulation: Normal appearance of the vertebral arteries, vertebrobasilar junction and basilar artery, as well as main branch vessels. Normal appearance of the posterior cerebral arteries. Fetal origin RIGHT posterior cerebral artery. No hemodynamically significant stenosis, dissection, luminal irregularity, contrast extravasation or aneurysm within the anterior nor posterior circulation. IMPRESSION: CTA head: Dense LEFT MCA and LEFT insular ribbon sign consistent with acute LEFT middle cerebral  artery territory infarct. CTA neck:  Negative. CTA head: Emergent distal LEFT M1 occlusion. 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 Acute findings discussed with and reconfirmed by Dr.MCNEILL [facility] on 07/11/2015 at 10:23 pm. Electronically Signed   By: Elon Alas M.D.   On: 07/11/2015 23:00   Dg Chest 1 View  07/13/2015  CLINICAL DATA:  Pulmonary edema, history atrial fibrillation, palpitations, former smoker, stroke, dilated non ischemic cardiomyopathy EXAM: CHEST 1 VIEW COMPARISON:  Portable exam 0929 hours compared to 07/12/2015 FINDINGS: Tip of endotracheal tube projects 4.4 cm above carina. Nasogastric tube extends into stomach. Normal heart size, mediastinal contours, and pulmonary vascularity. Minimal RIGHT basilar atelectasis. Lungs otherwise clear. No pleural effusion or pneumothorax. Question RIGHT nipple shadow not seen prior chest radiograph. IMPRESSION: Minimal RIGHT basilar atelectasis. Electronically Signed   By: Lavonia Dana M.D.   On: 07/13/2015 09:47   Ct Head Wo Contrast  07/13/2015  CLINICAL DATA:  Follow-up examination for stroke. Status post catheter directed revascularization with intra-arterial tPA. EXAM: CT HEAD WITHOUT CONTRAST TECHNIQUE: Contiguous axial images were obtained from the base of the skull through the vertex without intravenous contrast. COMPARISON:  Multiple previous studies from 07/11/2015 and 07/12/2015. FINDINGS: Age-related cerebral atrophy with chronic small vessel ischemic type changes again noted. There is evolving patchy hypodensity within the left MCA  territory involving the left insular region with extension into the left lentiform nucleus, compatible with evolving left MCA territory infarct focal hypodensity within the left temporal region consistent with evolving ischemia. Probable additional patchy hypodensity with subtle loss of gray-white matter differentiation more superficially and posteriorly within the posterior left frontal lobe.  No significant mass effect or evidence for hemorrhagic transformation. Previously seen dense left M1 segment no longer visualized. No other acute or evolving large vessel territory infarct. No intracranial hemorrhage. No mass lesion, midline shift, or mass effect. No hydrocephalus. No extra-axial fluid collection. Scalp soft tissues within normal limits. No acute abnormality about the orbits. Scattered mucosal thickening within the paranasal sinuses. Mastoid air cells grossly clear. Middle ear cavities clear. Calvarium intact. IMPRESSION: 1. Evolving patchy acute left MCA territory ischemic infarcts as above. No significant mass effect or evidence of hemorrhagic transformation. 2. No other new acute intracranial process. 3. 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 Electronically Signed   By: Jeannine Boga M.D.   On: 07/13/2015 01:53   Ct Head Wo Contrast  07/12/2015  CLINICAL DATA:  Follow-up LEFT middle cerebral artery stroke, status post LEFT middle cerebral endovascular revascularization. EXAM: CT HEAD WITHOUT CONTRAST TECHNIQUE: Contiguous axial images were obtained from the base of the skull through the vertex without intravenous contrast. COMPARISON:  CT head July 11, 2015 FINDINGS: Faint contrast staining of LEFT posterior temporal lobe, improved visualization of insula gray-white matter junction. Postcontrast imaging without evidence of hemorrhage, mass effect or midline shift. Ventricles and sulci are normal for patient's age. Subcentimeter enhancement RIGHT parasagittal parietal lobe compatible with developmental venous anomaly. No abnormal extra-axial fluid collections. Minimal calcific atherosclerosis the carotid siphons. Old RIGHT medial orbital blowout fracture. Mild paranasal sinus mucosal thickening, life-support lines in place. Mastoid air cells are well aerated. Prominent vascular lakes at the vertex. IMPRESSION: Faint contrast staining LEFT posterior temporal lobe, with  improved visualization gray-white matter junction, no hemorrhage. Electronically Signed   By: Elon Alas M.D.   On: 07/12/2015 02:05   Ct Head Wo Contrast  07/11/2015  CLINICAL DATA:  Code stroke. Right-sided facial droop and slurred speech. Uneven pupils. Initial encounter. EXAM: CT HEAD WITHOUT CONTRAST TECHNIQUE: Contiguous axial images were obtained from the base of the skull through the vertex without intravenous contrast. COMPARISON:  CT of the head performed 2020-01-2115 FINDINGS: There is no evidence of acute infarction, mass lesion, or intra- or extra-axial hemorrhage on CT. Prominence of the ventricles and sulci suggests mild cortical volume loss. Scattered subcortical and periventricular white matter change likely reflects small vessel ischemic microangiopathy, relatively stable from the prior study. Mild cerebellar atrophy is noted. The brainstem and fourth ventricle are within normal limits. The basal ganglia are unremarkable in appearance. The cerebral hemispheres demonstrate grossly normal gray-white differentiation. No mass effect or midline shift is seen. There is no evidence of fracture; chronic scattered calvarial lucencies are again noted at the vertex, relatively stable in appearance. The orbits are within normal limits. The paranasal sinuses and mastoid air cells are well-aerated. No significant soft tissue abnormalities are seen. IMPRESSION: 1. No acute intracranial pathology seen on CT. 2. Mild cortical volume loss and scattered small vessel ischemic microangiopathy. These results were called by telephone at the time of interpretation on 07/11/2015 at 10:18 pm to Dr. Leonel Ramsay, who verbally acknowledged these results. Electronically Signed   By: Garald Balding M.D.   On: 07/11/2015 22:21   Ct Angio Neck W/cm &/or Wo/cm  07/11/2015  CLINICAL DATA:  Code stroke, RIGHT facial droop. Slurred speech and uneven pupils. History of atrial fibrillation. EXAM: CT ANGIOGRAPHY HEAD TECHNIQUE:  Multidetector CT imaging of the head was performed using the standard protocol during bolus administration of intravenous contrast. Multiplanar CT image reconstructions and MIPs were obtained to evaluate the vascular anatomy. CONTRAST:  62m OMNIPAQUE IOHEXOL 350 MG/ML SOLN COMPARISON:  CT head July 11, 2015 at 2205 hours FINDINGS: CT HEAD Mildly dense LEFT MCA with LEFT insular ribbon sign. No intraparenchymal hemorrhage, mass effect, midline shift. Ventricles and sulci are overall normal for patient's age, cavum septum pellucidum is a normal variant. Subcentimeter density RIGHT parasagittal parietal lobe suggests developmental venous anomaly. No abnormal extra-axial fluid collections. Basal cisterns are patent. Ocular globes and orbital contents are normal. Paranasal sinuses and mastoid air cells are well aerated. No skull fracture. Multiple prominent vascular lakes at the convexity. Patient is edentulous. CTA NECK Aortic arch: Normal appearance of the thoracic arch, normal branch pattern. Mild calcific atherosclerosis of the aortic arch. The origins of the innominate, left Common carotid artery and subclavian artery are widely patent. Right carotid system: Common carotid artery is widely patent, coursing in a straight line fashion. Normal appearance of the carotid bifurcation without hemodynamically significant stenosis by NASCET criteria. Normal appearance of the included internal carotid artery. Left carotid system: Common carotid artery is widely patent, coursing in a straight line fashion. Normal appearance of the carotid bifurcation without hemodynamically significant stenosis by NASCET criteria. Mild eccentric calcific atherosclerosis. Normal appearance of the included internal carotid artery. Vertebral arteries:Left vertebral artery is dominant. Normal appearance of the vertebral arteries, which appear widely patent. Skeleton: No acute osseous process though bone windows have not been submitted. Moderate  to severe multilevel degenerative discs resulting in moderate to severe neural foraminal narrowing C3-4, C4-5. Old mild T2 compression fracture with scoliosis. No destructive bony lesions. Other neck: Soft tissues of the neck are nonacute though, not tailored for evaluation. CTA HEAD Anterior circulation: Normal appearance of the cervical internal carotid arteries, petrous, cavernous and supra clinoid internal carotid arteries. Widely patent anterior communicating artery. LEFT A1 is developmentally dominant. Acute distal LEFT M1 segment occlusion. Minimal collateral present on CTA. RIGHT middle cerebral artery vessels are patent. Posterior circulation: Normal appearance of the vertebral arteries, vertebrobasilar junction and basilar artery, as well as main branch vessels. Normal appearance of the posterior cerebral arteries. Fetal origin RIGHT posterior cerebral artery. No hemodynamically significant stenosis, dissection, luminal irregularity, contrast extravasation or aneurysm within the anterior nor posterior circulation. 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 CTA neck:  Negative. CTA head: Emergent distal LEFT M1 occlusion. 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 Acute findings discussed with and [facility] 10:23 pm. Electronically Signed   By: CElon AlasM.D.   On: 07/11/2015 23:00   Mr Brain Wo Contrast  07/13/2015  CLINICAL DATA:  Right-sided weakness and difficulty speaking EXAM: MRI HEAD WITHOUT CONTRAST MRA HEAD WITHOUT CONTRAST TECHNIQUE: Multiplanar, multiecho pulse sequences of the brain and surrounding structures were obtained without intravenous contrast. Angiographic images of the head were obtained using MRA technique without contrast. COMPARISON:  Multiple recent head CT FINDINGS: Motion degraded study which could obscure pathology. MRI HEAD  FINDINGS Calvarium and upper cervical spine: No focal marrow signal abnormality. Orbits: Negative. Sinuses and Mastoids: Chronic sinusitis with left predominant moderate mucosal thickening. Bilateral mastoid fluid or mucosal thickening with clear nasopharynx. Brain:  Restricted diffusion which is most confluent in the left MCA territory inferior division, including the posterior insula, posterior putamen, patchy deep white matter tracts, and lateral left temporal lobe. There is patchy infarcts along the high left frontal and low left parietal lobes. Well-developed cytotoxic edema without midline shift or herniation. No suspected hemorrhagic conversion; susceptibility artifact at the right splenium of the corpus callosum and at the base of the left temporal parietal infarct is attributed to developmental venous anomalies based on prior head CTs. No hydrocephalus. No evidence of major vessel occlusion. MRA HEAD FINDINGS Severely limited due to motion, with doubling or distortion of all vessels. This could easily obscure stenosis after occlusion. An aneurysm would likely be missed. Fetal type right PCA.  No other notable anatomic variant. There is symmetric signal within the bilateral MCA branches with no residual narrowing seen at the site of recent left M1 occlusion. IMPRESSION: 1. Acute infarct of roughly 1/3 left MCA territory, most confluent in the inferior territory. No suspected hemorrhagic conversion. 2. Motion degraded study which particularly affects the MRA. The re- opened left M1 segment remains patent. No evidence of proximal stenosis or occlusion. 3. 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 Electronically Signed   By: Monte Fantasia M.D.   On: 07/13/2015 19:08   Dg Chest Port 1 View  07/14/2015  CLINICAL DATA:  Respiratory failure. EXAM: PORTABLE CHEST 1 VIEW COMPARISON:  07/13/2015, 18-Apr-202016, 01/11/2010. FINDINGS: Interim extubation and removal of NG  tube. Mild mediastinal prominence, this may be related AP technique and patient rotation. Follow-up PA and lateral chest x-ray suggested. Cardiomegaly. No pulmonary venous congestion. Low lung volumes with mild bibasilar atelectasis and/or infiltrates. Small left pleural effusion. No pneumothorax. IMPRESSION: 1. Mild mediastinal prominence. This may be related AP technique and patient rotation. Follow-up PA and lateral chest x-ray suggested to further evaluate. 2. Low lung volumes. Mild bibasilar atelectasis and/or infiltrates. Small left pleural effusion. 3. Stable mild cardiomegaly.  No pulmonary venous congestion . Electronically Signed   By: Marcello Moores  Register   On: 07/14/2015 07:28   Portable Chest Xray  07/12/2015  CLINICAL DATA:  Endotracheal tube placement.  Initial encounter. EXAM: PORTABLE CHEST 1 VIEW COMPARISON:  Chest radiograph performed 18-Apr-202016 FINDINGS: The patient's endotracheal tube is seen ending 1-2 cm above the carina. This could be retracted 2 cm. Minimal bibasilar atelectasis is noted. Mild vascular congestion is noted. No pleural effusion or pneumothorax is seen. The cardiomediastinal silhouette is borderline normal in size. No acute osseous abnormalities are identified. IMPRESSION: 1. Endotracheal tube seen ending 1-2 cm above the carina. This could be retracted 2 cm. 2. Minimal bibasilar atelectasis noted. Mild vascular congestion seen. Electronically Signed   By: Garald Balding M.D.   On: 07/12/2015 02:51   Dg Abd Portable 1v  07/12/2015  CLINICAL DATA:  Orogastric tube placement.  Initial encounter. EXAM: PORTABLE ABDOMEN - 1 VIEW COMPARISON:  None. FINDINGS: The patient's enteric tube is noted ending overlying the body of the stomach. The side-port is noted about the fundus of the stomach. The visualized bowel gas pattern is unremarkable. Scattered air and stool filled loops of colon are seen; no abnormal dilatation of small bowel loops is seen to suggest small bowel obstruction. No  free intra-abdominal air is identified, though evaluation for free air is limited on a single supine view. The visualized osseous structures are within normal limits; the sacroiliac joints are unremarkable in appearance. The visualized lung bases are essentially clear. IMPRESSION:  Enteric tube noted ending overlying the body of the stomach. Electronically Signed   By: Garald Balding M.D.   On: 07/12/2015 23:58   Mr Jodene Nam Head/brain Wo Cm  07/13/2015  CLINICAL DATA:  Right-sided weakness and difficulty speaking EXAM: MRI HEAD WITHOUT CONTRAST MRA HEAD WITHOUT CONTRAST TECHNIQUE: Multiplanar, multiecho pulse sequences of the brain and surrounding structures were obtained without intravenous contrast. Angiographic images of the head were obtained using MRA technique without contrast. COMPARISON:  Multiple recent head CT FINDINGS: Motion degraded study which could obscure pathology. MRI HEAD FINDINGS Calvarium and upper cervical spine: No focal marrow signal abnormality. Orbits: Negative. Sinuses and Mastoids: Chronic sinusitis with left predominant moderate mucosal thickening. Bilateral mastoid fluid or mucosal thickening with clear nasopharynx. Brain: Restricted diffusion which is most confluent in the left MCA territory inferior division, including the posterior insula, posterior putamen, patchy deep white matter tracts, and lateral left temporal lobe. There is patchy infarcts along the high left frontal and low left parietal lobes. Well-developed cytotoxic edema without midline shift or herniation. No suspected hemorrhagic conversion; susceptibility artifact at the right splenium of the corpus callosum and at the base of the left temporal parietal infarct is attributed to developmental venous anomalies based on prior head CTs. No hydrocephalus. No evidence of major vessel occlusion. MRA HEAD FINDINGS Severely limited due to motion, with doubling or distortion of all vessels. This could easily obscure stenosis after  occlusion. An aneurysm would likely be missed. Fetal type right PCA.  No other notable anatomic variant. There is symmetric signal within the bilateral MCA branches with no residual narrowing seen at the site of recent left M1 occlusion. IMPRESSION: 1. Acute infarct of roughly 1/3 left MCA territory, most confluent in the inferior territory. No suspected hemorrhagic conversion. 2. Motion degraded study which particularly affects the MRA. The re- opened left M1 segment remains patent. No evidence of proximal stenosis or occlusion. 3. 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 Electronically Signed   By: Monte Fantasia M.D.   On: 07/13/2015 19:08    31M with pAF and HFrEF (EF nadir 35-40% with improvement to 40-45%) who presents with an acute ischemic stroke on 07/11/15 and developed AF with RVR. It is probable that his stroke was AF related. His CHADSVASC is now 4 (CHF, age, CVA) and requires systemic anticoagulation once safe from a neurologic perspective. He remains in AF with RVR but treatment options are limited given (1) mild hypotension on neosynephrine with need for adequate BP in post stroke period and (2) inability to pursue rhythm control strategy (e.g. Amiodarone) as he is not on anticoagulation at this time and very well may have LAA clot. Although he has a history of CHF, he has been well compensated and appears compensated now.   1. Would initiation systemic anticoagulation when safe from a neurologic perspective 2. No amiodarone give risk for recurrent stroke with AF to NSR conversion, particularly off of anticoagulation  3. No BB or CCB at this point as we need to prioritize adequate BP to allow for cerebral perfusion. His HR is 107-135bpm at rest.  He has normal renal function therefore will load with IV digoxin to try to get better rate control.  Start Digoxin 0.'25mg'$  IV q 6 hours for 4 doses then 0.'25mg'$  daily.    Phillip Margarita, MD  07/14/2015    8:03 AM

## 2015-07-14 NOTE — Consult Note (Signed)
Physical Medicine and Rehabilitation Consult Reason for Consult: Left MCA territory infarct Referring Physician: Dr. Leonie Man   HPI: Phillip Solis is a 70 y.o. right handed male with history of atrial fibrillation maintained on aspirin. History taken from chart review. Patient lives with spouse independent prior to admission using a straight cane. One level home with 3 steps to entry. By report his wife works during the day. Admitted 07/11/2015 with right sided weakness and difficulty speaking. MRI showed left MCA territory infarct. Patient did receive TPA. MRA of the head showed no evidence of proximal stenosis or occlusion. CT angiogram showed occluded left MCA M1 segment and underwent mechanical clot retrieval by interventional radiology. Patient did require mechanical ventilation for a short time until 07/13/2015. Echocardiogram with ejection fraction of 44% grade 1 diastolic dysfunction. There was akinesis of the basal-midinferolateral myocardium. Neurology consulted placed on Eliquis for CVA prophylaxis in light of atrial fibrillation. Currently on mechanical soft nectar thick liquid diet. Physical and occupational therapy evaluations completed 07/14/2015 with recommendations of physical medicine rehabilitation consult.  Review of Systems  Unable to perform ROS: medical condition   Past Medical History  Diagnosis Date  . History of atrial fibrillation     in setting of endoscopy in 2011; resolved  . History of palpitations   . Atrial fibrillation with rapid ventricular response ([place]) 07/14/2015   Past Surgical History  Procedure Laterality Date  . Left and right heart catheterization with coronary angiogram N/A 07/23/2014    Procedure: LEFT AND RIGHT HEART CATHETERIZATION WITH CORONARY ANGIOGRAM;  Surgeon: [name], MD;  Location: [facility] CATH LAB;  Service: Cardiovascular;  Laterality: N/A;   Family History  Problem Relation Age of Onset  . Heart Problems Mother   . Cancer  Father    Social History:  reports that he quit smoking about 42 years ago. His smokeless tobacco use includes Chew. He reports that he drinks alcohol. He reports that he does not use illicit drugs. Allergies: No Known Allergies Medications Prior to Admission  Medication Sig Dispense Refill  . aspirin EC 81 MG tablet Take 1 tablet (81 mg total) by mouth daily. 90 tablet 3  . bisoprolol (ZEBETA) 5 MG tablet Take 1 tablet (5 mg total) by mouth 2 (two) times daily. 180 tablet 3  . Multiple Vitamin (MULTIVITAMIN WITH MINERALS) TABS tablet Take 1 tablet by mouth daily.    . Niacin, Antihyperlipidemic, (NIASPAN PO) Take 1 tablet by mouth daily.      Home: Home Living Family/patient expects to be discharged to:: Inpatient rehab Living Arrangements: Spouse/significant other Available Help at Discharge: Family Type of Home: House Home Access: Stairs to enter [employer] of Steps: 3 Entrance Stairs-Rails: Right Home Layout: [place]: [place] - single point Additional Comments: Unclear accuracy of home set-up as pt with expressive deficits.    Lives With: Spouse  Functional History: Prior Function Level of Independence: Independent Comments: pt does indicate he drives at baseline.   Functional Status:  Mobility: Bed Mobility Overal bed mobility: Needs Assistance Bed Mobility: Supine to Sit Supine to sit: Min guard General bed mobility comments: With [facility] near flat pt comes to long sitting in bed and then brings his LEs off of bed.  pt needs increased time and with labored effort to complete without A.   Transfers Overall transfer level: Needs assistance Equipment used: 1 person hand held assist Transfers: Sit to/from Stand, Stand Pivot Transfers Sit to Stand: Min assist Stand  pivot transfers: Min assist General transfer comment: MinA for balance with coming up to standing and A to complete the pivot to chair.  pt tends to sit prematurely despite cueing for  completing pivot.  HR up to 150's during transfer non-sustained.        ADL: ADL Overall ADL's : Needs assistance/impaired Eating/Feeding: Set up, Sitting Eating/Feeding Details (indicate cue type and reason): pt did use BUE to feel self and assist with set up. Grooming: Oral care, Brushing hair, Set up, Sitting Grooming Details (indicate cue type and reason): Returned to room to assess grooming and pt required assist only for set up and no apraxia noted. Upper Body Bathing: Set up, Sitting, Cueing for sequencing Upper Body Bathing Details (indicate cue type and reason): cuing for what to do for a sponge bath.  Pt unable to problem solve since different than home set up. Lower Body Bathing: Minimal assistance, Sit to/from stand, Cueing for sequencing Lower Body Bathing Details (indicate cue type and reason): cuing for sequencing and initiation. Upper Body Dressing : Set up, Sitting Lower Body Dressing: Sit to/from stand, Moderate assistance Lower Body Dressing Details (indicate cue type and reason): assist to stand and maintain balance while standing to manage pants.  Cues for sequencing.   Toilet Transfer: Moderate assistance, BSC, Cueing for sequencing, Stand-pivot Toilet Transfer Details (indicate cue type and reason): HR up over 150 so no further mobility tested. Toileting- Clothing Manipulation and Hygiene: Minimal assistance, Sit to/from stand, Cueing for sequencing Functional mobility during ADLs: Moderate assistance General ADL Comments: Pt limited with [place] aphasia as well as decreased sequencing/problem solving skills and decreased balance all requring pt to receive assist with adls.  Cognition: Cognition Overall Cognitive Status: Impaired/Different from baseline Orientation Level: Oriented to person, Oriented to situation Cognition Arousal/Alertness: Awake/alert Behavior During Therapy: WFL for tasks assessed/performed Overall Cognitive Status: Impaired/Different from  baseline Area of Impairment: Orientation, Attention, Memory, Following commands, Safety/judgement, Awareness, Problem solving Orientation Level: Situation (unable to generate month alone but did w choices.) Current Attention Level: Sustained Memory: Decreased short-term memory Following Commands: Follows one step commands inconsistently, Follows one step commands with increased time Safety/Judgement: Decreased awareness of safety, Decreased awareness of deficits Awareness: Intellectual Problem Solving: Slow processing, Decreased initiation, Difficulty sequencing, Requires verbal cues, Requires tactile cues General Comments: Feel some deficits are more language related than cognition but very difficult to tell.  Pt could not follow all commands and answered "yes" to many questions that had a choice. Difficult to assess due to: Impaired communication  Blood pressure 106/68, pulse 123, temperature 98.6 F (37 C), temperature source Oral, resp. rate 18, height 6' (1.829 m), weight 77.5 kg (170 lb 13.7 oz), SpO2 94 %. Physical Exam  Vitals reviewed. Constitutional: He appears well-developed and well-nourished.  HENT:  Head: Normocephalic and atraumatic.  Eyes: EOM are normal.  Pupils reactive to light Injected conjunctiva  Neck: Normal range of motion. Neck supple. No thyromegaly present.  Cardiovascular:  Irregularly irregular  Respiratory: Effort normal and breath sounds normal. No respiratory distress.  GI: Soft. Bowel sounds are normal. He exhibits no distension.  Musculoskeletal: He exhibits no edema or tenderness.  Neurological: He is alert.  Patient does show some receptive/expressive aphasia. Follows simple demonstrated commands inconsistently Alert and oriented 1+ motor: R UE E/RLE appears to be 4 -/5 proximal distal LUE/LLE: Appears to be 4/5 proximal distal Sensation is? Intact light touch  DTRs symmetric  Skin: Skin is warm and dry.  Psychiatric: His affect is  blunt.  Cognition and memory are impaired.  Confused He is inattentive.    Results for orders placed or performed during the hospital encounter of 07/11/15 (from the past 24 hour(s))  Glucose, capillary     Status: None   Collection Time: 07/13/15 12:08 PM  Result Value Ref Range   Glucose-Capillary 81 65 - 99 mg/dL   Comment 1 Notify RN    Comment 2 Document in Chart   Glucose, capillary     Status: None   Collection Time: 07/13/15  4:29 PM  Result Value Ref Range   Glucose-Capillary 76 65 - 99 mg/dL   Comment 1 Notify RN    Comment 2 Document in Chart   Glucose, capillary     Status: Abnormal   Collection Time: 07/13/15  7:33 PM  Result Value Ref Range   Glucose-Capillary 103 (H) 65 - 99 mg/dL  CBC     Status: Abnormal   Collection Time: 07/14/15  4:09 AM  Result Value Ref Range   WBC 7.3 4.0 - 10.5 K/uL   RBC 3.65 (L) 4.22 - 5.81 MIL/uL   Hemoglobin 10.8 (L) 13.0 - 17.0 g/dL   HCT 33.4 (L) 39.0 - 52.0 %   MCV 91.5 78.0 - 100.0 fL   MCH 29.6 26.0 - 34.0 pg   MCHC 32.3 30.0 - 36.0 g/dL   RDW 14.4 11.5 - 15.5 %   Platelets 142 (L) 150 - 400 K/uL  Basic metabolic panel     Status: Abnormal   Collection Time: 07/14/15  4:09 AM  Result Value Ref Range   Sodium 145 135 - 145 mmol/L   Potassium 3.8 3.5 - 5.1 mmol/L   Chloride 115 (H) 101 - 111 mmol/L   CO2 24 22 - 32 mmol/L   Glucose, Bld 107 (H) 65 - 99 mg/dL   BUN 8 6 - 20 mg/dL   Creatinine, Ser 0.88 0.61 - 1.24 mg/dL   Calcium 7.9 (L) 8.9 - 10.3 mg/dL   GFR calc non Af Amer >60 >60 mL/min   GFR calc Af Amer >60 >60 mL/min   Anion gap 6 5 - 15  Glucose, capillary     Status: Abnormal   Collection Time: 07/14/15  7:29 AM  Result Value Ref Range   Glucose-Capillary 100 (H) 65 - 99 mg/dL   Comment 1 Notify RN    Comment 2 Document in Chart    Dg Chest 1 View  07/13/2015  CLINICAL DATA:  Pulmonary edema, history atrial fibrillation, palpitations, former smoker, stroke, dilated non ischemic cardiomyopathy EXAM: CHEST 1 VIEW  COMPARISON:  Portable exam 0929 hours compared to 07/12/2015 FINDINGS: Tip of endotracheal tube projects 4.4 cm above carina. Nasogastric tube extends into stomach. Normal heart size, mediastinal contours, and pulmonary vascularity. Minimal RIGHT basilar atelectasis. Lungs otherwise clear. No pleural effusion or pneumothorax. Question RIGHT nipple shadow not seen prior chest radiograph. IMPRESSION: Minimal RIGHT basilar atelectasis. Electronically Signed   By: Lavonia Dana M.D.   On: 07/13/2015 09:47   Ct Head Wo Contrast  07/13/2015  CLINICAL DATA:  Follow-up examination for stroke. Status post catheter directed revascularization with intra-arterial tPA. EXAM: CT HEAD WITHOUT CONTRAST TECHNIQUE: Contiguous axial images were obtained from the base of the skull through the vertex without intravenous contrast. COMPARISON:  Multiple previous studies from 07/11/2015 and 07/12/2015. FINDINGS: Age-related cerebral atrophy with chronic small vessel ischemic type changes again noted. There is evolving patchy hypodensity within the left MCA territory involving the left insular region  with extension into the left lentiform nucleus, compatible with evolving left MCA territory infarct focal hypodensity within the left temporal region consistent with evolving ischemia. Probable additional patchy hypodensity with subtle loss of gray-white matter differentiation more superficially and posteriorly within the posterior left frontal lobe. No significant mass effect or evidence for hemorrhagic transformation. Previously seen dense left M1 segment no longer visualized. No other acute or evolving large vessel territory infarct. No intracranial hemorrhage. No mass lesion, midline shift, or mass effect. No hydrocephalus. No extra-axial fluid collection. Scalp soft tissues within normal limits. No acute abnormality about the orbits. Scattered mucosal thickening within the paranasal sinuses. Mastoid air cells grossly clear. Middle ear  cavities clear. Calvarium intact. IMPRESSION: 1. Evolving patchy acute left MCA territory ischemic infarcts as above. No significant mass effect or evidence of hemorrhagic transformation. 2. No other new acute intracranial process. 3. 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 Electronically Signed   By: Jeannine Boga M.D.   On: 07/13/2015 01:53   Mr Brain Wo Contrast  07/13/2015  CLINICAL DATA:  Right-sided weakness and difficulty speaking EXAM: MRI HEAD WITHOUT CONTRAST MRA HEAD WITHOUT CONTRAST TECHNIQUE: Multiplanar, multiecho pulse sequences of the brain and surrounding structures were obtained without intravenous contrast. Angiographic images of the head were obtained using MRA technique without contrast. COMPARISON:  Multiple recent head CT FINDINGS: Motion degraded study which could obscure pathology. MRI HEAD FINDINGS Calvarium and upper cervical spine: No focal marrow signal abnormality. Orbits: Negative. Sinuses and Mastoids: Chronic sinusitis with left predominant moderate mucosal thickening. Bilateral mastoid fluid or mucosal thickening with clear nasopharynx. Brain: Restricted diffusion which is most confluent in the left MCA territory inferior division, including the posterior insula, posterior putamen, patchy deep white matter tracts, and lateral left temporal lobe. There is patchy infarcts along the high left frontal and low left parietal lobes. Well-developed cytotoxic edema without midline shift or herniation. No suspected hemorrhagic conversion; susceptibility artifact at the right splenium of the corpus callosum and at the base of the left temporal parietal infarct is attributed to developmental venous anomalies based on prior head CTs. No hydrocephalus. No evidence of major vessel occlusion. MRA HEAD FINDINGS Severely limited due to motion, with doubling or distortion of all vessels. This could easily obscure stenosis after occlusion. An aneurysm would likely be missed.  Fetal type right PCA.  No other notable anatomic variant. There is symmetric signal within the bilateral MCA branches with no residual narrowing seen at the site of recent left M1 occlusion. IMPRESSION: 1. Acute infarct of roughly 1/3 left MCA territory, most confluent in the inferior territory. No suspected hemorrhagic conversion. 2. Motion degraded study which particularly affects the MRA. The re- opened left M1 segment remains patent. No evidence of proximal stenosis or occlusion. 3. 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 Electronically Signed   By: Monte Fantasia M.D.   On: 07/13/2015 19:08   Dg Chest Port 1 View  07/14/2015  CLINICAL DATA:  Respiratory failure. EXAM: PORTABLE CHEST 1 VIEW COMPARISON:  07/13/2015, 04/01/202016, 01/11/2010. FINDINGS: Interim extubation and removal of NG tube. Mild mediastinal prominence, this may be related AP technique and patient rotation. Follow-up PA and lateral chest x-ray suggested. Cardiomegaly. No pulmonary venous congestion. Low lung volumes with mild bibasilar atelectasis and/or infiltrates. Small left pleural effusion. No pneumothorax. IMPRESSION: 1. Mild mediastinal prominence. This may be related AP technique and patient rotation. Follow-up PA and lateral chest x-ray suggested to further evaluate. 2. Low  lung volumes. Mild bibasilar atelectasis and/or infiltrates. Small left pleural effusion. 3. Stable mild cardiomegaly.  No pulmonary venous congestion . Electronically Signed   By: Marcello Moores  Register   On: 07/14/2015 07:28   Dg Abd Portable 1v  07/12/2015  CLINICAL DATA:  Orogastric tube placement.  Initial encounter. EXAM: PORTABLE ABDOMEN - 1 VIEW COMPARISON:  None. FINDINGS: The patient's enteric tube is noted ending overlying the body of the stomach. The side-port is noted about the fundus of the stomach. The visualized bowel gas pattern is unremarkable. Scattered air and stool filled loops of colon are seen; no  abnormal dilatation of small bowel loops is seen to suggest small bowel obstruction. No free intra-abdominal air is identified, though evaluation for free air is limited on a single supine view. The visualized osseous structures are within normal limits; the sacroiliac joints are unremarkable in appearance. The visualized lung bases are essentially clear. IMPRESSION: Enteric tube noted ending overlying the body of the stomach. Electronically Signed   By: Garald Balding M.D.   On: 07/12/2015 23:58   Mr Jodene Nam Head/brain Wo Cm  07/13/2015  CLINICAL DATA:  Right-sided weakness and difficulty speaking EXAM: MRI HEAD WITHOUT CONTRAST MRA HEAD WITHOUT CONTRAST TECHNIQUE: Multiplanar, multiecho pulse sequences of the brain and surrounding structures were obtained without intravenous contrast. Angiographic images of the head were obtained using MRA technique without contrast. COMPARISON:  Multiple recent head CT FINDINGS: Motion degraded study which could obscure pathology. MRI HEAD FINDINGS Calvarium and upper cervical spine: No focal marrow signal abnormality. Orbits: Negative. Sinuses and Mastoids: Chronic sinusitis with left predominant moderate mucosal thickening. Bilateral mastoid fluid or mucosal thickening with clear nasopharynx. Brain: Restricted diffusion which is most confluent in the left MCA territory inferior division, including the posterior insula, posterior putamen, patchy deep white matter tracts, and lateral left temporal lobe. There is patchy infarcts along the high left frontal and low left parietal lobes. Well-developed cytotoxic edema without midline shift or herniation. No suspected hemorrhagic conversion; susceptibility artifact at the right splenium of the corpus callosum and at the base of the left temporal parietal infarct is attributed to developmental venous anomalies based on prior head CTs. No hydrocephalus. No evidence of major vessel occlusion. MRA HEAD FINDINGS Severely limited due to  motion, with doubling or distortion of all vessels. This could easily obscure stenosis after occlusion. An aneurysm would likely be missed. Fetal type right PCA.  No other notable anatomic variant. There is symmetric signal within the bilateral MCA branches with no residual narrowing seen at the site of recent left M1 occlusion. IMPRESSION: 1. Acute infarct of roughly 1/3 left MCA territory, most confluent in the inferior territory. No suspected hemorrhagic conversion. 2. Motion degraded study which particularly affects the MRA. The re- opened left M1 segment remains patent. No evidence of proximal stenosis or occlusion. 3. 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 Electronically Signed   By: Monte Fantasia M.D.   On: 07/13/2015 19:08    Assessment/Plan: Diagnosis: Left MCA territory infarct Labs and images independently reviewed.  Records reviewed and summated above. Stroke: Continue secondary stroke prophylaxis and Risk Factor Modification listed below:   Antiplatelet therapy:   Blood Pressure Management:  Continue current medication with prn's with permisive HTN per primary team Statin Agent:   Pre-diabetes management:   Motor recovery: Fluoxetine  1. Does the need for close, 24 hr/day medical supervision in concert with the patient's rehab needs make it unreasonable for this patient  to be served in a less intensive setting? Yes  2. Co-Morbidities requiring supervision/potential complications: atrial fibrillation with RVR (continue to monitor heart rate with increased physical activity, cont meds), CHF (Monitor in accordance with increased physical activity and avoid UE resistance excercises),  Dysphagia (continue SLP, consider vital stem), Tachycardia (monitor in accordance with pain and increasing activity), tachypnea (monitor RR and O2 Sats with increased physical exertion), ABLA (transfuse if necessary to ensure appropriate perfusion for increased  activity tolerance), Thrombocytopenia (< 60,000/mm3 no resistive exercise) 3. Due to bladder management, safety, skin/wound care, disease management, medication administration and patient education, does the patient require 24 hr/day rehab nursing? Yes 4. Does the patient require coordinated care of a physician, rehab nurse, PT (1-2 hrs/day, 5 days/week), OT (1-2 hrs/day, 5 days/week) and SLP (1-2 hrs/day, 5 days/week) to address physical and functional deficits in the context of the above medical diagnosis(es)? Yes Addressing deficits in the following areas: balance, endurance, locomotion, strength, transferring, bowel/bladder control, bathing, dressing, feeding, grooming, toileting, cognition, speech, language, swallowing and psychosocial support 5. Can the patient actively participate in an intensive therapy program of at least 3 hrs of therapy per day at least 5 days per week? Potentially 6. The potential for patient to make measurable gains while on inpatient rehab is excellent 7. Anticipated functional outcomes upon discharge from inpatient rehab are modified independent and supervision  with PT, modified independent and supervision with OT, modified independent and supervision with SLP. 8. Estimated rehab length of stay to reach the above functional goals is: 14-18 days. 9. Does the patient have adequate social supports and living environment to accommodate these discharge functional goals? Potentially 10. Anticipated D/C setting: Home 11. Anticipated post D/C treatments: HH therapy and Home excercise program 12. Overall Rehab/Functional Prognosis: good  RECOMMENDATIONS: This patient's condition is appropriate for continued rehabilitative care in the following setting: Likely CIR, once patient demonstrates ability to tolerate 3 hours of therapy per day (HR, RR).  However, will need to clarify patient's discharged disposition. Patient has agreed to participate in recommended program.  Potentially Note that insurance prior authorization may be required for reimbursement for recommended care.  Comment: Rehab Admissions Coordinator to follow up.  Delice Lesch, MD 07/14/2015

## 2015-07-14 NOTE — Progress Notes (Signed)
  ANTICOAGULATION CONSULT NOTE - Initial Consult  Pharmacy Consult for Apixaban Indication: atrial fibrillation  No Known Allergies  Patient Measurements: Height: 6' (182.9 cm) Weight: 170 lb 13.7 oz (77.5 kg) IBW/kg (Calculated) : 77.6 Heparin Dosing Weight:   Vital Signs: Temp: 98.6 F (37 C) (03/06 0800) Temp Source: Oral (03/06 0800) BP: 106/68 mmHg (03/06 0700) Pulse Rate: 123 (03/06 0700)  Labs:  Recent Labs  07/11/15 2152  07/12/15 0558 07/13/15 0826 07/14/15 0409  HGB 13.8  < > 12.1* 11.7* 10.8*  HCT 40.4  < > 35.2* 36.4* 33.4*  PLT 178  --  160 160 142*  APTT 25  --   --   --   --   LABPROT 12.9  --   --   --   --   INR 0.96  --   --   --   --   CREATININE 1.04  < > 0.88 0.92 0.88  < > = values in this interval not displayed.  Estimated Creatinine Clearance: 85.6 mL/min (by C-G formula based on Cr of 0.88).   Medical History: Past Medical History  Diagnosis Date  . History of atrial fibrillation     in setting of endoscopy in 2011; resolved  . History of palpitations   . Atrial fibrillation with rapid ventricular response ([place]) 07/14/2015    Medications:  Prescriptions prior to admission  Medication Sig Dispense Refill Last Dose  . aspirin EC 81 MG tablet Take 1 tablet (81 mg total) by mouth daily. 90 tablet 3 07/11/2015 at Unknown time  . bisoprolol (ZEBETA) 5 MG tablet Take 1 tablet (5 mg total) by mouth 2 (two) times daily. 180 tablet 3 07/11/2015 at am  . Multiple Vitamin (MULTIVITAMIN WITH MINERALS) TABS tablet Take 1 tablet by mouth daily.   07/11/2015 at Unknown time  . Niacin, Antihyperlipidemic, (NIASPAN PO) Take 1 tablet by mouth daily.   07/11/2015 at Unknown time   Scheduled:  . aspirin  325 mg Oral Daily   Or  . aspirin  300 mg Rectal Daily  . digoxin  0.25 mg Intravenous Q6H  . [START ON 07/16/2015] digoxin  0.25 mg Oral Daily  . pantoprazole (PROTONIX) IV  40 mg Intravenous QHS   Infusions:  . sodium chloride 50 mL/hr at 07/14/15 0347  .  phenylephrine (NEO-SYNEPHRINE) Adult infusion 50 mcg/min (07/14/15 0750)    Assessment: 69yo male presented with R sided facial droop, slurred speech and uneven pupils and was called code stroke and given tPA IV and IA on 3/3. Pt developed Afib with RVR during stay. Pharmacy is consulted to dose apixaban for PAF.   Goal of Therapy:  Monitor platelets by anticoagulation protocol: Yes   Plan:  Apixaban '5mg'$  PO BID Monitor s/sx of bleeding Educate patient on apixaban  Andrey Cota. Diona Foley, PharmD, BCPS Clinical Pharmacist Pager [phone] 07/14/2015,10:49 AM

## 2015-07-14 NOTE — Progress Notes (Signed)
STROKE TEAM PROGRESS NOTE    SUBJECTIVE (INTERVAL HISTORY)  No family is at bedside today. Has been up working with therapy. Currently, sitting up in the chair. No new complaints.   OBJECTIVE Temp:  [97.5 F (36.4 C)-99.6 F (37.6 C)] 98.6 F (37 C) (03/06 0800) Pulse Rate:  [72-151] 123 (03/06 0700) Cardiac Rhythm:  [-] Atrial fibrillation (03/06 0800) Resp:  [13-34] 18 (03/06 0700) BP: (81-124)/(52-95) 106/68 mmHg (03/06 0700) SpO2:  [92 %-100 %] 94 % (03/06 0700) FiO2 (%):  [40 %] 40 % (03/05 1030) Weight:  [77.5 kg (170 lb 13.7 oz)] 77.5 kg (170 lb 13.7 oz) (03/06 0347)  CBC:   Recent Labs Lab 07/11/15 2152  07/12/15 0558 07/13/15 0826 07/14/15 0409  WBC 7.1  --  8.3 7.9 7.3  NEUTROABS 2.8  --  6.3  --   --   HGB 13.8  < > 12.1* 11.7* 10.8*  HCT 40.4  < > 35.2* 36.4* 33.4*  MCV 90.8  --  88.9 91.5 91.5  PLT 178  --  160 160 142*  < > = values in this interval not displayed.  Basic Metabolic Panel:   Recent Labs Lab 07/13/15 0826 07/14/15 0409  NA 139 145  K 3.7 3.8  CL 104 115*  CO2 25 24  GLUCOSE 101* 107*  BUN 12 8  CREATININE 0.92 0.88  CALCIUM 7.8* 7.9*    Lipid Panel:     Component Value Date/Time   CHOL 182 07/12/2015 0558   TRIG 209* 07/12/2015 0558   TRIG 207* 07/12/2015 0558   HDL 21* 07/12/2015 0558   CHOLHDL 8.7 07/12/2015 0558   VLDL 41* 07/12/2015 0558   LDLCALC 120* 07/12/2015 0558   HgbA1c:  Lab Results  Component Value Date   HGBA1C 5.9* 07/12/2015   Urine Drug Screen:     Component Value Date/Time   LABOPIA NONE DETECTED 07/11/2015 2243   COCAINSCRNUR NONE DETECTED 07/11/2015 2243   LABBENZ NONE DETECTED 07/11/2015 2243   AMPHETMU NONE DETECTED 07/11/2015 2243   THCU NONE DETECTED 07/11/2015 2243   LABBARB NONE DETECTED 07/11/2015 2243      IMAGING  Ct Head Wo Contrast 07/11/2015   1. No acute intracranial pathology seen on CT.  2. Mild cortical volume loss and scattered small vessel ischemic microangiopathy.    CT head 07/11/2015   Dense LEFT MCA and LEFT insular ribbon sign consistent with acute LEFT middle cerebral artery territory infarct.   CTA neck   07/11/2015   Negative.   CTA head 07/11/2015   Emergent distal LEFT M1 occlusion. 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   Cerebral angiogram 07/11/2015 S/P bilateral common carotid arteriogram,followed by endovascular revascularization of occluded Lt MCA M1 seg With x 2 passes with the solitaire FR 57m x 40 mm retrieval device And 5 mg of superselective IA tpa. TICI 2 b reperfusion achieved. Small slow flow Lt CC fistula noted with no compromise of distal ICA flow   Ct Head Wo Contrast 07/12/2015   Faint contrast staining LEFT posterior temporal lobe, with improved visualization gray-white matter junction, no hemorrhage.   MRI and MRA brain 07/13/2015 1. Acute infarct of roughly 1/3 left MCA territory, most confluent in the inferior territory. No suspected hemorrhagic conversion. 2. Motion degraded study which particularly affects the MRA. The re-opened left M1 segment remains patent. No evidence of proximal stenosis or occlusion. 3. 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   Ct Head Wo  Contrast 07/11/2015   1. No acute intracranial pathology seen on CT.  2. Mild cortical volume loss and scattered small vessel ischemic microangiopathy.   Portable Chest Xray 07/12/2015   1. Endotracheal tube seen ending 1-2 cm above the carina. This could be retracted 2 cm. 2. Minimal bibasilar atelectasis noted. Mild vascular congestion seen. Radiology recommendation was to retract 2 cm  07/13/2015 Minimal RIGHT basilar atelectasis.  Tip of endotracheal tube projects 4.4 cm above carina.  07/14/2015 1. Mild mediastinal prominence. This may be related AP technique and patient rotation. Follow-up PA and lateral chest x-ray suggested to further evaluate. 2. Low lung volumes. Mild bibasilar atelectasis  and/or infiltrates. Small left pleural effusion. 3. Stable mild cardiomegaly. No pulmonary venous congestion .  2D Echocardiogram  - Left ventricle: The cavity size was normal. Systolic function wasmildly reduced. The estimated ejection fraction was in the rangeof 45% to 50%. There is akinesis of the basal-midinferolateralmyocardium. Doppler parameters are consistent with abnormal leftventricular relaxation (grade 1 diastolic dysfunction). - Aortic valve: There was trivial regurgitation. - Mitral valve: There was mild regurgitation. Impressions:  EF improved from prior. No cardiac source of emboli wasindentified.   PHYSICAL EXAM Exam: Gen: NAD Eyes: anicteric sclerae, moist conjunctivae; sclera are slightly injected Chest:  CTA                   CV: no MRG, no carotid bruits, no peripheral edema Extrem:  No C/C/E  Neurological Exam:Awake and alert oriented 3. Mild expressive aphasia with word finding difficulties. Nonfluent speech. Good comprehension and naming. Impaired repetition.  Mental Status:  Does open eyes to sternal rub. Does not follow commands. Could not test vision due to mental status. PERRL. Conjugate gaze and normal OCRs.  Unable to assess symmetry of face due to intubation.  Positive cough and gag.   Motor/Sensory:  He was observed to move both legs off the edge of the bed on his own.  But does not follow commands.  He is moving his left side spontaneously and localizing with the left arm. Right hemiparesis. Does withdraw on the right side to stimulation but much less than on the left. Toes equivocal.  Coordination/Gait:  Deferred   ASSESSMENT/PLAN [name] is a 70 y.o. male with history of paroxysmal atrial fibrillation (not anticoagulated), palpitations, and congestive heart failure with ejection fraction 35-40% presenting with right hemiparesis and speech difficulties. He did receive IV TPA at 2230 on Friday, 07/11/2015. Taken to intervention. 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 Infarct felt to be embolic secondary to atrial fibrillation.  Resultant right hemiparesis  MRI  Left MCA infarct one third territory  MRA left M1 segment remains open  CTA of the neck negative  2D Echo  EF 45-50%, no source of embolus  LDL - 120  HgbA1c 5.9  VTE prophylaxis - SCDs DIET DYS 3 Room service appropriate?: Yes; Fluid consistency:: Nectar Thick  aspirin 81 mg daily prior to admission, started on Eliquis (apixaban) daily today; safe from stroke standpoint. discontinued aspirin.   Patient counseled to be compliant with his antithrombotic medications  Ongoing aggressive stroke risk factor management  Therapy recommendations: CIR. 3N1. Consult placed.  Disposition: Pending  Respiratory failure, resolved  Intubated for neuro intervention  Atrial Fibrillation with RVR  Initially diagnosed during the endoscopy procedure in 2011 without  recurrence  Home anticoagulation:  None Due to low CHA2DS2-VASc score of 0  CHA2DS2-VASc Score = 5, ?2 oral anticoagulation recommended  [age] Years[age]  76-74   +1  Sex:  Male   0  Hypertension History:  yes   +1     Diabetes Mellitus:  0   Congestive Heart Failure History:  yes   +1  Vascular Disease History:  0     Stroke/TIA/Thromboembolism History:  yes   +2  Treated with neo drip. No amiodarone, given risk for recurrent stroke with A. Fib to normal sinus rhythm conversion, particularly ANTICOAGULATION. No beta blocker or calcium channel blocker need to prioritize adequate blood pressure to allow cerebral perfusion. Started on digoxin by cardiology.    Started on Eliquis in the hospital. We'll continue at Discharge.   Hypertension  Blood pressure tends to run mildly low. (Low ejection  fraction)  Permissive hypertension (OK if < 220/120) but gradually normalize in 5-7 days  Hyperlipidemia  Home meds: Niaspan prior to admission - not reordered  LDL 120, goal < 70  Continue statin at discharge  Other Stroke Risk Factors  Advanced age  Cigarette smoker, quit smoking 42 years ago.  ETOH use  Atrial fibrillation (not anticoagulated)  Other Active Problems  Mild anemia  CHF with LV systolic dysfunction diagnosed in 2016. No coronary artery disease. Managed with Entresto and bisoprolol and EF has improved   Hospital day # [place] for Pager information 07/14/2015 3:01 PM  I have personally examined this patient, reviewed notes, independently viewed imaging studies, participated in medical decision making and plan of care. I have made any additions or clarifications directly to the above note. Agree with note above.  He presented with embolic left MCA infarct secondary to atrial fibrillation. He was treated with mechanical embolectomy and has done well but has mild residual expressive aphasia. He has passed swallow eval and will be started on anticoagulation as well as rate control medications for atrial fibrillation. Appreciate cardiology input. This patient is critically ill and at significant risk of neurological worsening, death and care requires constant monitoring of vital signs, hemodynamics,respiratory and cardiac monitoring, extensive review of multiple databases, frequent neurological assessment, discussion with family, other specialists and medical decision making of high complexity.I have made any additions or clarifications directly to the above note.This critical care time does not reflect procedure time, or teaching time or supervisory time of PA/NP/Med Resident etc but could involve care discussion time.  I spent 30 minutes of neurocritical care time  in the care of  this patient.    Antony Contras, MD Medical  Director [place] Pager: [phone] 07/14/2015 5:54 PM     To contact Stroke Continuity provider, please refer to [email or web address] After hours, contact General Neurology

## 2015-07-14 NOTE — Evaluation (Signed)
Speech Language Pathology Evaluation Patient Details Name: Phillip Solis MRN: [id number] DOB: 06/24/1945 Today's Date: 07/14/2015 Time: 7829-5621 SLP Time Calculation (min) (ACUTE ONLY): 24 min  Problem List:  Patient Active Problem List   Diagnosis Date Noted  . Atrial fibrillation with rapid ventricular response ([place]) 07/14/2015  . Stroke (cerebrum) ([place]) 07/12/2015  . Acute respiratory failure with hypoxia ([place])   . Acute ischemic stroke ([place]) 07/11/2015  . 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 PAF (paroxysmal atrial fibrillation) ([place]) 1April 02, 202014  . DOE (dyspnea on exertion) 1April 02, 202014  . Tobacco abuse, in remission 1April 02, 202014  . Claudication ([place]) 1April 02, 202014   Past Medical History:  Past Medical History  Diagnosis Date  . History of atrial fibrillation     in setting of endoscopy in 2011; resolved  . History of palpitations   . Atrial fibrillation with rapid ventricular response ([place]) 07/14/2015   Past Surgical History:  Past Surgical History  Procedure Laterality Date  . Left and right heart catheterization with coronary angiogram N/A 07/23/2014    Procedure: LEFT AND RIGHT HEART CATHETERIZATION WITH CORONARY ANGIOGRAM;  Surgeon: [name], MD;  Location: [facility] CATH LAB;  Service: Cardiovascular;  Laterality: N/A;   HPI:  70 y.o. male with history of paroxysmal atrial fibrillation and congestive heart failure with ejection fraction 35-40% presented 3/3 with right hemiparesis and speech difficulties; received IV TPA. 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 S/P bilateral common carotid arteriogram,followed by endovascular revascularization of occluded Lt MCA M1 seg.  Intubated 3/3-3/5.    Assessment / Plan / Recommendation Clinical Impression  Pt presents with a Broca's type aphasia marked by expressive>receptive deficits with mild dysfluency, deficits in naming, impaired repetition;  pt with mild receptive language deficits c/b decreased ability to answer complex yes/no questions and follow two-step commands.  Recommend SLP intervention for aphasia.      SLP Assessment  Patient needs continued Speech Lanaguage Pathology Services    Follow Up Recommendations   (tba)    Frequency and Duration min 2x/week  2 weeks      SLP Evaluation Prior Functioning  Cognitive/Linguistic Baseline: Within functional limits Type of Home: House  Lives With: Spouse Available Help at Discharge: Family   Cognition  Overall Cognitive Status: Impaired/Different from baseline Orientation Level: Oriented to person;Oriented to situation    Comprehension  Auditory Comprehension Overall Auditory Comprehension: Impaired Yes/No Questions: Within Functional Limits (for biographical questions) Commands: Impaired One Step Basic Commands: 75-100% accurate Conversation: Simple Visual Recognition/Discrimination Discrimination: Exceptions to [facility] Pictures:  (60%) Reading Comprehension Reading Status: Impaired Word level: Impaired    Expression Expression Primary Mode of Expression: Verbal Verbal Expression Overall Verbal Expression: Impaired Initiation: No impairment Level of Generative/Spontaneous Verbalization: Sentence Repetition: Impaired Level of Impairment: Word level;Phrase level Naming: Impairment Responsive: 26-50% accurate Confrontation: Impaired Pictures:  (60%) Convergent: 50-74% accurate Written Expression Written Expression: Not tested   Oral / Motor  Oral Motor/Sensory Function Overall Oral Motor/Sensory Function: Mild impairment Facial Symmetry: Suspected CN VII (facial) dysfunction;Abnormal symmetry left Motor Speech Overall Motor Speech: Appears within functional limits for tasks assessed   GO                    Juan Quam Laurice 07/14/2015, 9:47 AM

## 2015-07-14 NOTE — Progress Notes (Signed)
Inpatient Rehabilitation  Patient was screened by Gerlean Ren for appropriateness for an Inpatient Acute Rehab consult.  At this time, we are recommending Inpatient Rehab consult.  Please order consult if you are agreeable.  [facility] Admissions Coordinator Cell [phone] Office [phone]  a

## 2015-07-14 NOTE — Evaluation (Signed)
Clinical/Bedside Swallow Evaluation Patient Details  Name: Phillip Solis MRN: [id number] Date of Birth: 1945-07-21  Today's Date: 07/14/2015 Time: SLP Start Time (ACUTE ONLY): 0846 SLP Stop Time (ACUTE ONLY): 0901 SLP Time Calculation (min) (ACUTE ONLY): 15 min  Past Medical History:  Past Medical History  Diagnosis Date  . History of atrial fibrillation     in setting of endoscopy in 2011; resolved  . History of palpitations    Past Surgical History:  Past Surgical History  Procedure Laterality Date  . Left and right heart catheterization with coronary angiogram N/A 07/23/2014    Procedure: LEFT AND RIGHT HEART CATHETERIZATION WITH CORONARY ANGIOGRAM;  Surgeon: [name], MD;  Location: [facility] CATH LAB;  Service: Cardiovascular;  Laterality: N/A;   HPI:  70 y.o. male with history of paroxysmal atrial fibrillation and congestive heart failure with ejection fraction 35-40% presented 3/3 with right hemiparesis and speech difficulties; received IV TPA. 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 S/P bilateral common carotid arteriogram,followed by endovascular revascularization of occluded Lt MCA M1 seg.  Intubated 3/3-3/5.    Assessment / Plan / Recommendation Clinical Impression  Pt presents with s/s of a mild dysphagia s/p left MCA and two-day intubation.  There are mild left CN VII deficits; multiple subswallows per bolus; intermittent coughing associated with thin liquid consumption, concerning for aspiration.  No such signs noted with nectar-thick liquids.  Recommend initiating a dysphagia 3 diet with nectar-thick liquids; meds whole in puree.  SLP to follow for safety/toleration and diet advancement.     Aspiration Risk  Mild aspiration risk    Diet Recommendation     Medication Administration: Whole meds with puree    Other  Recommendations Oral Care Recommendations: Oral care BID Other Recommendations:  Order thickener from pharmacy   Follow up Recommendations   (tba)    Frequency and Duration min 2x/week  2 weeks       Prognosis Prognosis for Safe Diet Advancement: Fair      Swallow Study   General Date of Onset: 07/11/15 HPI: 70 y.o. male with history of paroxysmal atrial fibrillation and congestive heart failure with ejection fraction 35-40% presented 3/3 with right hemiparesis and speech difficulties; received IV TPA. 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 S/P bilateral common carotid arteriogram,followed by endovascular revascularization of occluded Lt MCA M1 seg.  Intubated 3/3-3/5.  Type of Study: Bedside Swallow Evaluation Diet Prior to this Study: NPO Temperature Spikes Noted: No Respiratory Status: Room air History of Recent Intubation: Yes Length of Intubations (days): 2 days Date extubated: 07/13/15 Behavior/Cognition: Alert;Cooperative;Pleasant mood Oral Cavity Assessment: Within Functional Limits Oral Care Completed by SLP: Yes Oral Cavity - Dentition: Dentures, top;Dentures, bottom Vision: Functional for self-feeding Self-Feeding Abilities: Able to feed self Patient Positioning: Upright in bed Baseline Vocal Quality: Normal Volitional Cough: Cognitively unable to elicit Volitional Swallow: Unable to elicit    Oral/Motor/Sensory Function Overall Oral Motor/Sensory Function: Mild impairment Facial Symmetry: Suspected CN VII (facial) dysfunction;Abnormal symmetry left   Ice Chips Ice chips: Within functional limits   Thin Liquid Thin Liquid: Impaired Presentation: Cup Pharyngeal  Phase Impairments: Multiple swallows;Cough - Delayed    Nectar Thick Nectar Thick Liquid: Impaired Pharyngeal Phase Impairments: Multiple swallows   Honey Thick Honey Thick Liquid: Not tested   Puree Puree: Within functional limits Presentation: Self Fed   Solid   GO   Solid: Impaired Presentation: Self  Fed  Pharyngeal Phase Impairments: Suspected delayed Swallow       Jeanean Hollett L. Tivis Ringer, MA CCC/SLP Pager [phone]  Juan Quam Laurice 07/14/2015,9:31 AM

## 2015-07-14 NOTE — Evaluation (Signed)
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  pt intubated 07/11/15 - 07/13/15 and now with A-fib.  pt with hx of A-fib and CHF.     Clinical Impression   Pt admitted with the above diagnosis and has the deficits listed below.  Pt would benefit from cont OT to increase independence with basic self care and functional mobility skills as well as problem solving and sequencing skills so pt can d/c home with his wife and be independent again.  Wife is available 24/7 but fell pt would benefit from rehab before returning home to reduce burden of care on caregiver.      Follow Up Recommendations  CIR;Supervision/Assistance - 24 hour    Equipment Recommendations  3 in 1 bedside comode    Recommendations for Other Services Rehab consult     Precautions / Restrictions Precautions Precautions: Fall;Other (comment) Precaution Comments: Watch HR.  Keep under 150 Restrictions Weight Bearing Restrictions: No      Mobility Bed Mobility Overal bed mobility: Needs Assistance Bed Mobility: Supine to Sit     Supine to sit: Min guard     General bed mobility comments: With [facility] near flat pt comes to long sitting in bed and then brings his LEs off of bed.  pt needs increased time and with labored effort to complete without A.    Transfers Overall transfer level: Needs assistance Equipment used: 1 person hand held assist Transfers: Sit to/from [facility] Sit to Stand: Min assist Stand pivot transfers: Min assist       General transfer comment: MinA for balance with coming up to standing and A to complete the pivot to chair.  pt tends to sit prematurely despite cueing for completing pivot.  HR up to 150's during transfer non-sustained.      Balance Overall balance assessment:  Needs assistance Sitting-balance support: Feet supported Sitting balance-Leahy Scale: Fair Sitting balance - Comments: difficulty with balance while doing adls EOB Postural control: Posterior lean Standing balance support: During functional activity Standing balance-Leahy Scale: Poor Standing balance comment: Pt must have outside assist while standing at this tiem.                            ADL Overall ADL's : Needs assistance/impaired Eating/Feeding: Set up;Sitting Eating/Feeding Details (indicate cue type and reason): pt did use BUE to feel self and assist with set up. Grooming: Oral care;Brushing hair;Set up;Sitting Grooming Details (indicate cue type and reason): Returned to room to assess grooming and pt required assist only for set up and no apraxia noted. Upper Body Bathing: Set up;Sitting;Cueing for sequencing Upper Body Bathing Details (indicate cue type and reason): cuing for what to do for a sponge bath.  Pt unable to problem solve since different than home set up. Lower Body Bathing: Minimal assistance;Sit to/from stand;Cueing for sequencing Lower Body Bathing Details (indicate cue type and reason): cuing for sequencing and initiation. Upper Body Dressing : Set up;Sitting   Lower Body Dressing: Sit to/from stand;Moderate assistance Lower Body Dressing Details (indicate cue type and reason): assist to stand and maintain balance while standing to manage pants.  Cues for sequencing.   Toilet Transfer: Moderate assistance;BSC;Cueing for sequencing;Stand-pivot Toilet Transfer Details (indicate cue type and reason): HR up over 150 so no further  mobility tested. Toileting- Clothing Manipulation and Hygiene: Minimal assistance;Sit to/from stand;Cueing for sequencing       Functional mobility during ADLs: Moderate assistance General ADL Comments: Pt limited with Brocas aphasia as well as decreased sequencing/problem solving skills and decreased balance all requring pt to  receive assist with adls.     Vision Vision Assessment?: Yes Eye Alignment: Within Functional Limits Ocular Range of Motion: Within Functional Limits Alignment/Gaze Preference: Within Defined Limits Tracking/Visual Pursuits: Able to track stimulus in all quads without difficulty Visual Fields: No apparent deficits   Perception     Praxis Praxis Praxis tested?: Within functional limits    Pertinent Vitals/Pain Pain Assessment: Faces Faces Pain Scale: Hurts little more Pain Location: Legs (grabbed at L knee when testing strength) Pain Descriptors / Indicators: Grimacing;Guarding Pain Intervention(s): Limited activity within patient's tolerance;Monitored during session;Repositioned;Other (comment) (spoke to RN)     Hand Dominance Right   Extremity/Trunk Assessment Upper Extremity Assessment Upper Extremity Assessment: Generalized weakness   Lower Extremity Assessment Lower Extremity Assessment: Defer to PT evaluation RLE Deficits / Details: pt grossly weak 4-/5, but with intact sensation.  pt does indicate soreness in LEs during movment.   RLE Coordination: decreased fine motor;decreased gross motor LLE Deficits / Details: pt grossly weak at 4-/5, but with intact sensation.  pt grimaces more with movement of L LE than R LE and question if pain is from knee.   LLE Coordination: decreased fine motor;decreased gross motor   Cervical / Trunk Assessment Cervical / Trunk Assessment: Normal   Communication Communication Communication: Expressive difficulties;Receptive difficulties   Cognition Arousal/Alertness: Awake/alert Behavior During Therapy: WFL for tasks assessed/performed Overall Cognitive Status: Impaired/Different from baseline Area of Impairment: Orientation;Attention;Memory;Following commands;Safety/judgement;Awareness;Problem solving Orientation Level: Situation (unable to generate month alone but did w choices.) Current Attention Level: Sustained Memory: Decreased  short-term memory Following Commands: Follows one step commands inconsistently;Follows one step commands with increased time Safety/Judgement: Decreased awareness of safety;Decreased awareness of deficits Awareness: Intellectual Problem Solving: Slow processing;Decreased initiation;Difficulty sequencing;Requires verbal cues;Requires tactile cues General Comments: Feel some deficits are more language related than cognition but very difficult to tell.  Pt could not follow all commands and answered "yes" to many questions that had a choice.   General Comments       Exercises       Shoulder Instructions      Home Living Family/patient expects to be discharged to:: Inpatient rehab Living Arrangements: Spouse/significant other Available Help at Discharge: Family Type of Home: House Home Access: Stairs to enter [employer] of Steps: 3 Entrance Stairs-Rails: Right Home Layout: One level               Home Equipment: [place] - single point   Additional Comments: Unclear accuracy of home set-up as pt with expressive deficits.    Lives With: Spouse    Prior Functioning/Environment Level of Independence: Independent        Comments: pt does indicate he drives at baseline.      OT Diagnosis: Generalized weakness;Cognitive deficits   OT Problem List: Decreased strength;Impaired balance (sitting and/or standing);Decreased cognition;Decreased safety awareness;Decreased knowledge of use of DME or AE;Pain   OT Treatment/Interventions: Self-care/ADL training;DME and/or AE instruction;Therapeutic activities;Cognitive remediation/compensation    OT Goals(Current goals can be found in the care plan section) Acute Rehab OT Goals Patient Stated Goal: pt unable to state. OT Goal Formulation: With patient Time For Goal Achievement: 07/28/15 Potential to Achieve Goals: Good ADL Goals Pt Will Perform Eating: Independently;sitting Pt Will  Perform Grooming: with  supervision;standing Pt Will Perform Lower Body Bathing: with supervision;sit to/from stand Pt Will Perform Lower Body Dressing: with supervision;sit to/from stand Pt Will Transfer to Toilet: with supervision;ambulating;regular height toilet Pt Will Perform Tub/Shower Transfer: Tub transfer;with min guard assist;shower seat;ambulating  OT Frequency: Min 2X/week   Barriers to D/C: Decreased caregiver support  only has wife home to assist.       Co-evaluation PT/OT/SLP Co-Evaluation/Treatment: Yes Reason for Co-Treatment: For patient/therapist safety PT goals addressed during session: Mobility/safety with mobility;Strengthening/ROM OT goals addressed during session: ADL's and self-care      End of Session Equipment Utilized During Treatment: Gait belt Nurse Communication: Mobility status  Activity Tolerance: Patient tolerated treatment well Patient left: in chair;with call bell/phone within reach;with chair alarm set   Time: 4010-2725 OT Time Calculation (min): 21 min Charges:  OT General Charges $OT Visit: 1 Procedure OT Evaluation $OT Eval Moderate Complexity: 1 Procedure G-Codes:    Glenford Peers 07-16-2015, 10:26 AM  [phone]

## 2015-07-14 NOTE — Progress Notes (Signed)
Patient ID: Phillip Solis, male   DOB: 07-15-1945, 70 y.o.   MRN: [id number]    Referring Physician(s): [place]  Supervising Physician: Arlean Hopping  Chief Complaint:  Left MCA CVA  Subjective: Pt sitting up in chair; responds to questions/follows simple commands; still with some exp aphasia   Allergies: Review of patient's allergies indicates no known allergies.  Medications: Prior to Admission medications   Medication Sig Start Date End Date Taking? Authorizing Provider  aspirin EC 81 MG tablet Take 1 tablet (81 mg total) by mouth daily. 05/21/15  Yes [name], MD  bisoprolol (ZEBETA) 5 MG tablet Take 1 tablet (5 mg total) by mouth 2 (two) times daily. 05/21/15  Yes [name], MD  Multiple Vitamin (MULTIVITAMIN WITH MINERALS) TABS tablet Take 1 tablet by mouth daily.   Yes Historical Provider, MD  Niacin, Antihyperlipidemic, (NIASPAN PO) Take 1 tablet by mouth daily.   Yes Historical Provider, MD     Vital Signs: BP 106/68 mmHg  Pulse 123  Temp(Src) 98.6 F (37 C) (Oral)  Resp 18  Ht 6' (1.829 m)  Wt 170 lb 13.7 oz (77.5 kg)  BMI 23.17 kg/m2  SpO2 94%  Physical Exam alert; exp aphasia, pupils equal but sluggish; strength RUE 4/5; RLE 3/5; LUE 5/5; LLE 4/5; puncture sites R/L groins clean, sl ecchymotic, soft, no discrete hematomas; intact distal pulses Imaging: Ct Angio Head W/cm &/or Wo Cm  07/11/2015  CLINICAL DATA:  Code stroke, RIGHT facial droop. Slurred speech and uneven pupils. History of atrial fibrillation. EXAM: CT ANGIOGRAPHY HEAD TECHNIQUE: Multidetector CT imaging of the head was performed using the standard protocol during bolus administration of intravenous contrast. Multiplanar CT image reconstructions and MIPs were obtained to evaluate the vascular anatomy. CONTRAST:  95m OMNIPAQUE IOHEXOL 350 MG/ML SOLN COMPARISON:  CT head July 11, 2015 at 2205 hours FINDINGS: CT HEAD Mildly dense LEFT MCA with LEFT insular ribbon sign. No  intraparenchymal hemorrhage, mass effect, midline shift. Ventricles and sulci are overall normal for patient's age, cavum septum pellucidum is a normal variant. Subcentimeter density RIGHT parasagittal parietal lobe suggests developmental venous anomaly. No abnormal extra-axial fluid collections. Basal cisterns are patent. Ocular globes and orbital contents are normal. Paranasal sinuses and mastoid air cells are well aerated. No skull fracture. Multiple prominent vascular lakes at the convexity. Patient is edentulous. CTA NECK Aortic arch: Normal appearance of the thoracic arch, normal branch pattern. Mild calcific atherosclerosis of the aortic arch. The origins of the innominate, left Common carotid artery and subclavian artery are widely patent. Right carotid system: Common carotid artery is widely patent, coursing in a straight line fashion. Normal appearance of the carotid bifurcation without hemodynamically significant stenosis by NASCET criteria. Normal appearance of the included internal carotid artery. Left carotid system: Common carotid artery is widely patent, coursing in a straight line fashion. Normal appearance of the carotid bifurcation without hemodynamically significant stenosis by NASCET criteria. Mild eccentric calcific atherosclerosis. Normal appearance of the included internal carotid artery. Vertebral arteries:Left vertebral artery is dominant. Normal appearance of the vertebral arteries, which appear widely patent. Skeleton: No acute osseous process though bone windows have not been submitted. Moderate to severe multilevel degenerative discs resulting in moderate to severe neural foraminal narrowing C3-4, C4-5. Old mild T2 compression fracture with scoliosis. No destructive bony lesions. Other neck: Soft tissues of the neck are nonacute though, not tailored for evaluation. CTA HEAD Anterior circulation: Normal appearance of the cervical internal carotid arteries, petrous, cavernous and supra  clinoid internal carotid arteries. Widely patent anterior communicating artery. LEFT A1 is developmentally dominant. Acute distal LEFT M1 segment occlusion. Minimal collateral present on CTA. RIGHT middle cerebral artery vessels are patent. Posterior circulation: Normal appearance of the vertebral arteries, vertebrobasilar junction and basilar artery, as well as main branch vessels. Normal appearance of the posterior cerebral arteries. Fetal origin RIGHT posterior cerebral artery. No hemodynamically significant stenosis, dissection, luminal irregularity, contrast extravasation or aneurysm within the anterior nor posterior circulation. 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 CTA neck:  Negative. CTA head: Emergent distal LEFT M1 occlusion. 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 Acute findings discussed with and reconfirmed by Dr.MCNEILL [facility] on 07/11/2015 at 10:23 pm. Electronically Signed   By: Elon Alas M.D.   On: 07/11/2015 23:00   Dg Chest 1 View  07/13/2015  CLINICAL DATA:  Pulmonary edema, history atrial fibrillation, palpitations, former smoker, stroke, dilated non ischemic cardiomyopathy EXAM: CHEST 1 VIEW COMPARISON:  Portable exam 0929 hours compared to 07/12/2015 FINDINGS: Tip of endotracheal tube projects 4.4 cm above carina. Nasogastric tube extends into stomach. Normal heart size, mediastinal contours, and pulmonary vascularity. Minimal RIGHT basilar atelectasis. Lungs otherwise clear. No pleural effusion or pneumothorax. Question RIGHT nipple shadow not seen prior chest radiograph. IMPRESSION: Minimal RIGHT basilar atelectasis. Electronically Signed   By: Lavonia Dana M.D.   On: 07/13/2015 09:47   Ct Head Wo Contrast  07/13/2015  CLINICAL DATA:  Follow-up examination for stroke. Status post catheter directed revascularization with intra-arterial tPA. EXAM: CT HEAD WITHOUT  CONTRAST TECHNIQUE: Contiguous axial images were obtained from the base of the skull through the vertex without intravenous contrast. COMPARISON:  Multiple previous studies from 07/11/2015 and 07/12/2015. FINDINGS: Age-related cerebral atrophy with chronic small vessel ischemic type changes again noted. There is evolving patchy hypodensity within the left MCA territory involving the left insular region with extension into the left lentiform nucleus, compatible with evolving left MCA territory infarct focal hypodensity within the left temporal region consistent with evolving ischemia. Probable additional patchy hypodensity with subtle loss of gray-white matter differentiation more superficially and posteriorly within the posterior left frontal lobe. No significant mass effect or evidence for hemorrhagic transformation. Previously seen dense left M1 segment no longer visualized. No other acute or evolving large vessel territory infarct. No intracranial hemorrhage. No mass lesion, midline shift, or mass effect. No hydrocephalus. No extra-axial fluid collection. Scalp soft tissues within normal limits. No acute abnormality about the orbits. Scattered mucosal thickening within the paranasal sinuses. Mastoid air cells grossly clear. Middle ear cavities clear. Calvarium intact. IMPRESSION: 1. Evolving patchy acute left MCA territory ischemic infarcts as above. No significant mass effect or evidence of hemorrhagic transformation. 2. No other new acute intracranial process. 3. 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 Electronically Signed   By: Jeannine Boga M.D.   On: 07/13/2015 01:53   Ct Head Wo Contrast  07/12/2015  CLINICAL DATA:  Follow-up LEFT middle cerebral artery stroke, status post LEFT middle cerebral endovascular revascularization. EXAM: CT HEAD WITHOUT CONTRAST TECHNIQUE: Contiguous axial images were obtained from the base of the skull through the vertex without intravenous contrast.  COMPARISON:  CT head July 11, 2015 FINDINGS: Faint contrast staining of LEFT posterior temporal lobe, improved visualization of insula gray-white matter junction. Postcontrast imaging without evidence of hemorrhage, mass effect or midline shift. Ventricles and sulci are normal for patient's age. Subcentimeter enhancement RIGHT parasagittal parietal lobe  compatible with developmental venous anomaly. No abnormal extra-axial fluid collections. Minimal calcific atherosclerosis the carotid siphons. Old RIGHT medial orbital blowout fracture. Mild paranasal sinus mucosal thickening, life-support lines in place. Mastoid air cells are well aerated. Prominent vascular lakes at the vertex. IMPRESSION: Faint contrast staining LEFT posterior temporal lobe, with improved visualization gray-white matter junction, no hemorrhage. Electronically Signed   By: Elon Alas M.D.   On: 07/12/2015 02:05   Ct Head Wo Contrast  07/11/2015  CLINICAL DATA:  Code stroke. Right-sided facial droop and slurred speech. Uneven pupils. Initial encounter. EXAM: CT HEAD WITHOUT CONTRAST TECHNIQUE: Contiguous axial images were obtained from the base of the skull through the vertex without intravenous contrast. COMPARISON:  CT of the head performed Apr 26, 202016 FINDINGS: There is no evidence of acute infarction, mass lesion, or intra- or extra-axial hemorrhage on CT. Prominence of the ventricles and sulci suggests mild cortical volume loss. Scattered subcortical and periventricular white matter change likely reflects small vessel ischemic microangiopathy, relatively stable from the prior study. Mild cerebellar atrophy is noted. The brainstem and fourth ventricle are within normal limits. The basal ganglia are unremarkable in appearance. The cerebral hemispheres demonstrate grossly normal gray-white differentiation. No mass effect or midline shift is seen. There is no evidence of fracture; chronic scattered calvarial lucencies are again noted at the  vertex, relatively stable in appearance. The orbits are within normal limits. The paranasal sinuses and mastoid air cells are well-aerated. No significant soft tissue abnormalities are seen. IMPRESSION: 1. No acute intracranial pathology seen on CT. 2. Mild cortical volume loss and scattered small vessel ischemic microangiopathy. These results were called by telephone at the time of interpretation on 07/11/2015 at 10:18 pm to Dr. Leonel Ramsay, who verbally acknowledged these results. Electronically Signed   By: Garald Balding M.D.   On: 07/11/2015 22:21   Ct Angio Neck W/cm &/or Wo/cm  07/11/2015  CLINICAL DATA:  Code stroke, RIGHT facial droop. Slurred speech and uneven pupils. History of atrial fibrillation. EXAM: CT ANGIOGRAPHY HEAD TECHNIQUE: Multidetector CT imaging of the head was performed using the standard protocol during bolus administration of intravenous contrast. Multiplanar CT image reconstructions and MIPs were obtained to evaluate the vascular anatomy. CONTRAST:  33m OMNIPAQUE IOHEXOL 350 MG/ML SOLN COMPARISON:  CT head July 11, 2015 at 2205 hours FINDINGS: CT HEAD Mildly dense LEFT MCA with LEFT insular ribbon sign. No intraparenchymal hemorrhage, mass effect, midline shift. Ventricles and sulci are overall normal for patient's age, cavum septum pellucidum is a normal variant. Subcentimeter density RIGHT parasagittal parietal lobe suggests developmental venous anomaly. No abnormal extra-axial fluid collections. Basal cisterns are patent. Ocular globes and orbital contents are normal. Paranasal sinuses and mastoid air cells are well aerated. No skull fracture. Multiple prominent vascular lakes at the convexity. Patient is edentulous. CTA NECK Aortic arch: Normal appearance of the thoracic arch, normal branch pattern. Mild calcific atherosclerosis of the aortic arch. The origins of the innominate, left Common carotid artery and subclavian artery are widely patent. Right carotid system: Common carotid  artery is widely patent, coursing in a straight line fashion. Normal appearance of the carotid bifurcation without hemodynamically significant stenosis by NASCET criteria. Normal appearance of the included internal carotid artery. Left carotid system: Common carotid artery is widely patent, coursing in a straight line fashion. Normal appearance of the carotid bifurcation without hemodynamically significant stenosis by NASCET criteria. Mild eccentric calcific atherosclerosis. Normal appearance of the included internal carotid artery. Vertebral arteries:Left vertebral artery is dominant. Normal appearance of the  vertebral arteries, which appear widely patent. Skeleton: No acute osseous process though bone windows have not been submitted. Moderate to severe multilevel degenerative discs resulting in moderate to severe neural foraminal narrowing C3-4, C4-5. Old mild T2 compression fracture with scoliosis. No destructive bony lesions. Other neck: Soft tissues of the neck are nonacute though, not tailored for evaluation. CTA HEAD Anterior circulation: Normal appearance of the cervical internal carotid arteries, petrous, cavernous and supra clinoid internal carotid arteries. Widely patent anterior communicating artery. LEFT A1 is developmentally dominant. Acute distal LEFT M1 segment occlusion. Minimal collateral present on CTA. RIGHT middle cerebral artery vessels are patent. Posterior circulation: Normal appearance of the vertebral arteries, vertebrobasilar junction and basilar artery, as well as main branch vessels. Normal appearance of the posterior cerebral arteries. Fetal origin RIGHT posterior cerebral artery. No hemodynamically significant stenosis, dissection, luminal irregularity, contrast extravasation or aneurysm within the anterior nor posterior circulation. 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 CTA neck:   Negative. CTA head: Emergent distal LEFT M1 occlusion. 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 Acute findings discussed with and reconfirmed by Dr[facility]MCNEILL [facility] on 07/11/2015 at 10:23 pm. Electronically Signed   By: Elon Alas M.D.   On: 07/11/2015 23:00   Mr Brain Wo Contrast  07/13/2015  CLINICAL DATA:  Right-sided weakness and difficulty speaking EXAM: MRI HEAD WITHOUT CONTRAST MRA HEAD WITHOUT CONTRAST TECHNIQUE: Multiplanar, multiecho pulse sequences of the brain and surrounding structures were obtained without intravenous contrast. Angiographic images of the head were obtained using MRA technique without contrast. COMPARISON:  Multiple recent head CT FINDINGS: Motion degraded study which could obscure pathology. MRI HEAD FINDINGS Calvarium and upper cervical spine: No focal marrow signal abnormality. Orbits: Negative. Sinuses and Mastoids: Chronic sinusitis with left predominant moderate mucosal thickening. Bilateral mastoid fluid or mucosal thickening with clear nasopharynx. Brain: Restricted diffusion which is most confluent in the left MCA territory inferior division, including the posterior insula, posterior putamen, patchy deep white matter tracts, and lateral left temporal lobe. There is patchy infarcts along the high left frontal and low left parietal lobes. Well-developed cytotoxic edema without midline shift or herniation. No suspected hemorrhagic conversion; susceptibility artifact at the right splenium of the corpus callosum and at the base of the left temporal parietal infarct is attributed to developmental venous anomalies based on prior head CTs. No hydrocephalus. No evidence of major vessel occlusion. MRA HEAD FINDINGS Severely limited due to motion, with doubling or distortion of all vessels. This could easily obscure stenosis after occlusion. An aneurysm would likely be missed. Fetal type right PCA.  No other notable anatomic variant. There is symmetric signal within  the bilateral MCA branches with no residual narrowing seen at the site of recent left M1 occlusion. IMPRESSION: 1. Acute infarct of roughly 1/3 left MCA territory, most confluent in the inferior territory. No suspected hemorrhagic conversion. 2. Motion degraded study which particularly affects the MRA. The re- opened left M1 segment remains patent. No evidence of proximal stenosis or occlusion. 3. 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 Electronically Signed   By: Monte Fantasia M.D.   On: 07/13/2015 19:08   Dg Chest Port 1 View  07/14/2015  CLINICAL DATA:  Respiratory failure. EXAM: PORTABLE CHEST 1 VIEW COMPARISON:  07/13/2015, 15-Nov-202016, 01/11/2010. FINDINGS: Interim extubation and removal of NG tube. Mild mediastinal prominence, this may be related AP technique and patient rotation. Follow-up PA and  lateral chest x-ray suggested. Cardiomegaly. No pulmonary venous congestion. Low lung volumes with mild bibasilar atelectasis and/or infiltrates. Small left pleural effusion. No pneumothorax. IMPRESSION: 1. Mild mediastinal prominence. This may be related AP technique and patient rotation. Follow-up PA and lateral chest x-ray suggested to further evaluate. 2. Low lung volumes. Mild bibasilar atelectasis and/or infiltrates. Small left pleural effusion. 3. Stable mild cardiomegaly.  No pulmonary venous congestion . Electronically Signed   By: Marcello Moores  Register   On: 07/14/2015 07:28   Portable Chest Xray  07/12/2015  CLINICAL DATA:  Endotracheal tube placement.  Initial encounter. EXAM: PORTABLE CHEST 1 VIEW COMPARISON:  Chest radiograph performed 08/01/202016 FINDINGS: The patient's endotracheal tube is seen ending 1-2 cm above the carina. This could be retracted 2 cm. Minimal bibasilar atelectasis is noted. Mild vascular congestion is noted. No pleural effusion or pneumothorax is seen. The cardiomediastinal silhouette is borderline normal in size. No acute osseous  abnormalities are identified. IMPRESSION: 1. Endotracheal tube seen ending 1-2 cm above the carina. This could be retracted 2 cm. 2. Minimal bibasilar atelectasis noted. Mild vascular congestion seen. Electronically Signed   By: Garald Balding M.D.   On: 07/12/2015 02:51   Dg Abd Portable 1v  07/12/2015  CLINICAL DATA:  Orogastric tube placement.  Initial encounter. EXAM: PORTABLE ABDOMEN - 1 VIEW COMPARISON:  None. FINDINGS: The patient's enteric tube is noted ending overlying the body of the stomach. The side-port is noted about the fundus of the stomach. The visualized bowel gas pattern is unremarkable. Scattered air and stool filled loops of colon are seen; no abnormal dilatation of small bowel loops is seen to suggest small bowel obstruction. No free intra-abdominal air is identified, though evaluation for free air is limited on a single supine view. The visualized osseous structures are within normal limits; the sacroiliac joints are unremarkable in appearance. The visualized lung bases are essentially clear. IMPRESSION: Enteric tube noted ending overlying the body of the stomach. Electronically Signed   By: Garald Balding M.D.   On: 07/12/2015 23:58   Mr Jodene Nam Head/brain Wo Cm  07/13/2015  CLINICAL DATA:  Right-sided weakness and difficulty speaking EXAM: MRI HEAD WITHOUT CONTRAST MRA HEAD WITHOUT CONTRAST TECHNIQUE: Multiplanar, multiecho pulse sequences of the brain and surrounding structures were obtained without intravenous contrast. Angiographic images of the head were obtained using MRA technique without contrast. COMPARISON:  Multiple recent head CT FINDINGS: Motion degraded study which could obscure pathology. MRI HEAD FINDINGS Calvarium and upper cervical spine: No focal marrow signal abnormality. Orbits: Negative. Sinuses and Mastoids: Chronic sinusitis with left predominant moderate mucosal thickening. Bilateral mastoid fluid or mucosal thickening with clear nasopharynx. Brain: Restricted  diffusion which is most confluent in the left MCA territory inferior division, including the posterior insula, posterior putamen, patchy deep white matter tracts, and lateral left temporal lobe. There is patchy infarcts along the high left frontal and low left parietal lobes. Well-developed cytotoxic edema without midline shift or herniation. No suspected hemorrhagic conversion; susceptibility artifact at the right splenium of the corpus callosum and at the base of the left temporal parietal infarct is attributed to developmental venous anomalies based on prior head CTs. No hydrocephalus. No evidence of major vessel occlusion. MRA HEAD FINDINGS Severely limited due to motion, with doubling or distortion of all vessels. This could easily obscure stenosis after occlusion. An aneurysm would likely be missed. Fetal type right PCA.  No other notable anatomic variant. There is symmetric signal within the bilateral MCA branches with no residual  narrowing seen at the site of recent left M1 occlusion. IMPRESSION: 1. Acute infarct of roughly 1/3 left MCA territory, most confluent in the inferior territory. No suspected hemorrhagic conversion. 2. Motion degraded study which particularly affects the MRA. The re- opened left M1 segment remains patent. No evidence of proximal stenosis or occlusion. 3. 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 Electronically Signed   By: Monte Fantasia M.D.   On: 07/13/2015 19:08    Labs:  CBC:  Recent Labs  07/11/15 2152 07/11/15 2208 07/12/15 0558 07/13/15 0826 07/14/15 0409  WBC 7.1  --  8.3 7.9 7.3  HGB 13.8 14.3 12.1* 11.7* 10.8*  HCT 40.4 42.0 35.2* 36.4* 33.4*  PLT 178  --  160 160 142*    COAGS:  Recent Labs  07/15/14 1041 07/11/15 2152  INR 0.99 0.96  APTT 33 25    BMP:  Recent Labs  07/11/15 2152 07/11/15 2208 07/12/15 0558 07/13/15 0826 07/14/15 0409  NA 137 140 138 139 145  K 4.2 4.2 4.0 3.7 3.8  CL  105 106 110 104 115*  CO2 21*  --  19* 25 24  GLUCOSE 134* 130* 106* 101* 107*  BUN '18 19 13 12 8  '$ CALCIUM 8.5*  --  8.0* 7.8* 7.9*  CREATININE 1.04 1.00 0.88 0.92 0.88  GFRNONAA >60  --  >60 >60 >60  GFRAA >60  --  >60 >60 >60    LIVER FUNCTION TESTS:  Recent Labs  07/11/15 2152 07/13/15 0826  BILITOT 0.4 1.0  AST 22 39  ALT 14* 19  ALKPHOS 62 57  PROT 5.9* 5.1*  ALBUMIN 3.3* 2.7*    Assessment and Plan: S/p left MCA CVA with bil carotid arteriogram /endovasc revasc of occl left MCA M1 segment utilizing retrieval device/IA TPA 3/4 ; 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 Rowe Robert 07/14/2015, 10:18 AM   I spent a total of 15 minutes at the the patient's bedside AND on the patient's hospital floor or unit, greater than 50% of which was counseling/coordinating care for cerebral arteriogram with endovascular intervention

## 2015-07-14 NOTE — Evaluation (Signed)
Physical Therapy Evaluation Patient Details Name: Phillip Solis MRN: [id number] DOB: [birth year] Today's Date: 07/14/2015   History of Present Illness  pt presents with L MCA Infarct post tpa and Bil Cerebral Angiogram with revascularization.  pt intubated 07/11/15 - 07/13/15 and now with A-fib.  pt with hx of A-fib and CHF.    Clinical Impression  Pt very pleasant and attempts anything PT asks of him.  Pt with communication deficits and cognitive deficits impacting session, but overall mobility limited by HR up to 150's during transfer non-sustained.  Pt is generally weak and debilitated and would benefit from CIR level of therapies at D/C to maximize independence.  Will continue to follow.      Follow Up Recommendations CIR    Equipment Recommendations  None recommended by PT    Recommendations for Other Services Rehab consult     Precautions / Restrictions Precautions Precautions: Fall;Other (comment) Precaution Comments: Watch HR Restrictions Weight Bearing Restrictions: No      Mobility  Bed Mobility Overal bed mobility: Needs Assistance Bed Mobility: Supine to Sit     Supine to sit: Min guard     General bed mobility comments: With [facility] near flat pt comes to long sitting in bed and then brings his LEs off of bed.  pt needs increased time and with labored effort to complete without A.    Transfers Overall transfer level: Needs assistance Equipment used: 1 person hand held assist Transfers: Sit to/from [facility] Sit to Stand: Min assist Stand pivot transfers: Min assist       General transfer comment: MinA for balance with coming up to standing and A to complete the pivot to chair.  pt tends to sit prematurely despite cueing for completing pivot.  HR up to 150's during transfer non-sustained.    Ambulation/Gait                Stairs            Wheelchair Mobility    Modified Rankin (Stroke Patients Only) Modified Rankin (Stroke  Patients Only) Pre-Morbid Rankin Score: No symptoms (Assumed, but not certain.) Modified Rankin: Severe disability     Balance Overall balance assessment: Needs assistance Sitting-balance support: No upper extremity supported;Feet supported Sitting balance-Leahy Scale: Fair Sitting balance - Comments: Difficulty with balance challenges and needs UE support.   Standing balance support: Single extremity supported;No upper extremity supported;During functional activity Standing balance-Leahy Scale: Poor                               Pertinent Vitals/Pain Pain Assessment: Faces Faces Pain Scale: Hurts little more Pain Location: pt states 'my legs".   Pain Descriptors / Indicators: Grimacing Pain Intervention(s): Monitored during session;Repositioned (Made RN aware.)    Home Living Family/patient expects to be discharged to:: Inpatient rehab Living Arrangements: Spouse/significant other Available Help at Discharge: Family Type of Home: House Home Access: Stairs to enter Entrance Stairs-Rails: Right Entrance Stairs-Number of Steps: 3 Home Layout: One level Home Equipment: Cane - single point Additional Comments: Unclear accuracy of home set-up as pt with expressive deficits.      Prior Function Level of Independence: Independent         Comments: pt does indicate he drives at baseline.       Hand Dominance        Extremity/Trunk Assessment   Upper Extremity Assessment: Defer to OT evaluation  Lower Extremity Assessment: Generalized weakness;RLE deficits/detail;LLE deficits/detail RLE Deficits / Details: pt grossly weak 4-/5, but with intact sensation.  pt does indicate soreness in LEs during movment.   LLE Deficits / Details: pt grossly weak at 4-/5, but with intact sensation.  pt grimaces more with movement of L LE than R LE and question if pain is from knee.    Cervical / Trunk Assessment: Normal  Communication   Communication:  Expressive difficulties  Cognition Arousal/Alertness: Awake/alert Behavior During Therapy: WFL for tasks assessed/performed Overall Cognitive Status: Impaired/Different from baseline Area of Impairment: Orientation;Attention;Memory;Following commands;Safety/judgement;Awareness;Problem solving Orientation Level: Disoriented to;Situation (pt able to pick correct month when given options.) Current Attention Level: Sustained Memory: Decreased short-term memory Following Commands: Follows one step commands inconsistently;Follows one step commands with increased time Safety/Judgement: Decreased awareness of safety;Decreased awareness of deficits Awareness: Intellectual Problem Solving: Slow processing;Decreased initiation;Difficulty sequencing;Requires verbal cues;Requires tactile cues General Comments: Difficulty discerning cognitive deficits vs langauge deficits.  pt does follow most directions, but not consistently and does seem to attempt to answer questions, but unsure of accuracy of answers.      General Comments      Exercises        Assessment/Plan    PT Assessment Patient needs continued PT services  PT Diagnosis Difficulty walking   PT Problem List Decreased strength;Decreased activity tolerance;Decreased balance;Decreased mobility;Decreased coordination;Decreased cognition;Decreased knowledge of use of DME;Decreased safety awareness;Cardiopulmonary status limiting activity;Pain  PT Treatment Interventions DME instruction;Gait training;Stair training;Functional mobility training;Therapeutic activities;Therapeutic exercise;Balance training;Neuromuscular re-education;Cognitive remediation;Patient/family education   PT Goals (Current goals can be found in the Care Plan section) Acute Rehab PT Goals Patient Stated Goal: pt unable to state. PT Goal Formulation: With patient Time For Goal Achievement: 07/28/15 Potential to Achieve Goals: Good    Frequency Min 4X/week   Barriers to  discharge        Co-evaluation PT/OT/SLP Co-Evaluation/Treatment: Yes Reason for Co-Treatment: For patient/therapist safety PT goals addressed during session: Mobility/safety with mobility;Balance;Strengthening/ROM         End of Session Equipment Utilized During Treatment: Gait belt Activity Tolerance: Patient tolerated treatment well Patient left: in chair;with call bell/phone within reach;with chair alarm set Nurse Communication: Mobility status (HR)         Time: 2993-7169 PT Time Calculation (min) (ACUTE ONLY): 21 min   Charges:   PT Evaluation $PT Eval Moderate Complexity: 1 Procedure     PT G CodesCatarina Hartshorn, [place] 07/14/2015, 10:03 AM

## 2015-07-14 NOTE — Progress Notes (Signed)
PULMONARY  / CRITICAL CARE MEDICINE CONSULTATION   Name: Phillip Solis MRN: [id number] DOB: 02-02-1946    ADMISSION DATE:  07/11/2015 CONSULTATION DATE: July 14, 2015  REQUESTING CLINICIAN: Garvin Fila, MD PRIMARY SERVICE: [facility]  CHIEF COMPLAINT:  Altered Mental Status  BRIEF PATIENT DESCRIPTION: 70 y/o man with hx of CHF admitted 3/3 with ischemic stroke now s/p IR clot retrieval.  SIGNIFICANT EVENTS / STUDIES:  3/3>>> IR directed mechanical clot retrieval 3/5 CT head>>>  1. Evolving patchy acute left MCA territory ischemic infarcts as above. No significant mass effect or evidence of hemorrhagic transformation.  2. No other new acute intracranial process.  3. 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 3/5 Af-RVR - cards consulted  LINES / TUBES: ETT 3/3>>>3/5  CULTURES: None  ANTIBIOTICS: None   SUBJECTIVE:   AF-RVR  Overnight Tachy this am afebrile  VITAL SIGNS: Temp:  [97.5 F (36.4 C)-99.6 F (37.6 C)] 98.6 F (37 C) (03/06 0800) Pulse Rate:  [72-151] 123 (03/06 0700) Resp:  [13-34] 18 (03/06 0700) BP: (81-124)/(52-95) 106/68 mmHg (03/06 0700) SpO2:  [92 %-100 %] 94 % (03/06 0700) Weight:  [77.5 kg (170 lb 13.7 oz)] 77.5 kg (170 lb 13.7 oz) (03/06 0347) HEMODYNAMICS:   VENTILATOR SETTINGS:   INTAKE / OUTPUT: Intake/Output      03/05 0701 - 03/06 0700 03/06 0701 - 03/07 0700   I.V. (mL/kg) 2244.2 (29)    NG/GT 102.5    IV Piggyback 1000    Total Intake(mL/kg) 3346.7 (43.2)    Urine (mL/kg/hr) 1390 (0.7)    Stool 0 (0)    Total Output 1390     Net +1956.7          Urine Occurrence 650 x    Stool Occurrence 1 x      PHYSICAL EXAMINATION: General: acutely ill  Neuro:  Alert, follows simple commands  HEENT:  MMM,  Neck: No LAD Cardiovascular:  Heart sounds dual and normal. Regular rate. Lungs: resps even non labored, few scattered rhonchi Abdomen:  Soft Musculoskeletal:  No swollen joints Skin:  No  rashes.  LABS:  CBC  Recent Labs Lab 07/12/15 0558 07/13/15 0826 07/14/15 0409  WBC 8.3 7.9 7.3  HGB 12.1* 11.7* 10.8*  HCT 35.2* 36.4* 33.4*  PLT 160 160 142*   Coag's  Recent Labs Lab 07/11/15 2152  APTT 25  INR 0.96   BMET  Recent Labs Lab 07/12/15 0558 07/13/15 0826 07/14/15 0409  NA 138 139 145  K 4.0 3.7 3.8  CL 110 104 115*  CO2 19* 25 24  BUN '13 12 8  '$ CREATININE 0.88 0.92 0.88  GLUCOSE 106* 101* 107*   Electrolytes  Recent Labs Lab 07/12/15 0558 07/13/15 0826 07/14/15 0409  CALCIUM 8.0* 7.8* 7.9*   Sepsis Markers No results for input(s): LATICACIDVEN, PROCALCITON, O2SATVEN in the last 168 hours. ABG  Recent Labs Lab 07/12/15 0425  PHART 7.427  PCO2ART 28.8*  PO2ART 175*   Liver Enzymes  Recent Labs Lab 07/11/15 2152 07/13/15 0826  AST 22 39  ALT 14* 19  ALKPHOS 62 57  BILITOT 0.4 1.0  ALBUMIN 3.3* 2.7*   Cardiac Enzymes No results for input(s): TROPONINI, PROBNP in the last 168 hours. Glucose  Recent Labs Lab 07/13/15 0418 07/13/15 0835 07/13/15 1208 07/13/15 1629 07/13/15 1933 07/14/15 0729  GLUCAP 79 73 81 76 103* 100*    Imaging Dg Chest 1 View  07/13/2015  CLINICAL DATA:  Pulmonary edema, history atrial  fibrillation, palpitations, former smoker, stroke, dilated non ischemic cardiomyopathy EXAM: CHEST 1 VIEW COMPARISON:  Portable exam 0929 hours compared to 07/12/2015 FINDINGS: Tip of endotracheal tube projects 4.4 cm above carina. Nasogastric tube extends into stomach. Normal heart size, mediastinal contours, and pulmonary vascularity. Minimal RIGHT basilar atelectasis. Lungs otherwise clear. No pleural effusion or pneumothorax. Question RIGHT nipple shadow not seen prior chest radiograph. IMPRESSION: Minimal RIGHT basilar atelectasis. Electronically Signed   By: Lavonia Dana M.D.   On: 07/13/2015 09:47   Ct Head Wo Contrast  07/13/2015  CLINICAL DATA:  Follow-up examination for stroke. Status post catheter directed  revascularization with intra-arterial tPA. EXAM: CT HEAD WITHOUT CONTRAST TECHNIQUE: Contiguous axial images were obtained from the base of the skull through the vertex without intravenous contrast. COMPARISON:  Multiple previous studies from 07/11/2015 and 07/12/2015. FINDINGS: Age-related cerebral atrophy with chronic small vessel ischemic type changes again noted. There is evolving patchy hypodensity within the left MCA territory involving the left insular region with extension into the left lentiform nucleus, compatible with evolving left MCA territory infarct focal hypodensity within the left temporal region consistent with evolving ischemia. Probable additional patchy hypodensity with subtle loss of gray-white matter differentiation more superficially and posteriorly within the posterior left frontal lobe. No significant mass effect or evidence for hemorrhagic transformation. Previously seen dense left M1 segment no longer visualized. No other acute or evolving large vessel territory infarct. No intracranial hemorrhage. No mass lesion, midline shift, or mass effect. No hydrocephalus. No extra-axial fluid collection. Scalp soft tissues within normal limits. No acute abnormality about the orbits. Scattered mucosal thickening within the paranasal sinuses. Mastoid air cells grossly clear. Middle ear cavities clear. Calvarium intact. IMPRESSION: 1. Evolving patchy acute left MCA territory ischemic infarcts as above. No significant mass effect or evidence of hemorrhagic transformation. 2. No other new acute intracranial process. 3. 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 Electronically Signed   By: Jeannine Boga M.D.   On: 07/13/2015 01:53   Mr Brain Wo Contrast  07/13/2015  CLINICAL DATA:  Right-sided weakness and difficulty speaking EXAM: MRI HEAD WITHOUT CONTRAST MRA HEAD WITHOUT CONTRAST TECHNIQUE: Multiplanar, multiecho pulse sequences of the brain and surrounding structures were  obtained without intravenous contrast. Angiographic images of the head were obtained using MRA technique without contrast. COMPARISON:  Multiple recent head CT FINDINGS: Motion degraded study which could obscure pathology. MRI HEAD FINDINGS Calvarium and upper cervical spine: No focal marrow signal abnormality. Orbits: Negative. Sinuses and Mastoids: Chronic sinusitis with left predominant moderate mucosal thickening. Bilateral mastoid fluid or mucosal thickening with clear nasopharynx. Brain: Restricted diffusion which is most confluent in the left MCA territory inferior division, including the posterior insula, posterior putamen, patchy deep white matter tracts, and lateral left temporal lobe. There is patchy infarcts along the high left frontal and low left parietal lobes. Well-developed cytotoxic edema without midline shift or herniation. No suspected hemorrhagic conversion; susceptibility artifact at the right splenium of the corpus callosum and at the base of the left temporal parietal infarct is attributed to developmental venous anomalies based on prior head CTs. No hydrocephalus. No evidence of major vessel occlusion. MRA HEAD FINDINGS Severely limited due to motion, with doubling or distortion of all vessels. This could easily obscure stenosis after occlusion. An aneurysm would likely be missed. Fetal type right PCA.  No other notable anatomic variant. There is symmetric signal within the bilateral MCA branches with no residual narrowing seen at the site of recent left M1 occlusion.  IMPRESSION: 1. Acute infarct of roughly 1/3 left MCA territory, most confluent in the inferior territory. No suspected hemorrhagic conversion. 2. Motion degraded study which particularly affects the MRA. The re- opened left M1 segment remains patent. No evidence of proximal stenosis or occlusion. 3. 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 Electronically Signed   By: Monte Fantasia  M.D.   On: 07/13/2015 19:08   Dg Chest Port 1 View  07/14/2015  CLINICAL DATA:  Respiratory failure. EXAM: PORTABLE CHEST 1 VIEW COMPARISON:  07/13/2015, 09-06-202016, 01/11/2010. FINDINGS: Interim extubation and removal of NG tube. Mild mediastinal prominence, this may be related AP technique and patient rotation. Follow-up PA and lateral chest x-ray suggested. Cardiomegaly. No pulmonary venous congestion. Low lung volumes with mild bibasilar atelectasis and/or infiltrates. Small left pleural effusion. No pneumothorax. IMPRESSION: 1. Mild mediastinal prominence. This may be related AP technique and patient rotation. Follow-up PA and lateral chest x-ray suggested to further evaluate. 2. Low lung volumes. Mild bibasilar atelectasis and/or infiltrates. Small left pleural effusion. 3. Stable mild cardiomegaly.  No pulmonary venous congestion . Electronically Signed   By: Marcello Moores  Register   On: 07/14/2015 07:28   Dg Abd Portable 1v  07/12/2015  CLINICAL DATA:  Orogastric tube placement.  Initial encounter. EXAM: PORTABLE ABDOMEN - 1 VIEW COMPARISON:  None. FINDINGS: The patient's enteric tube is noted ending overlying the body of the stomach. The side-port is noted about the fundus of the stomach. The visualized bowel gas pattern is unremarkable. Scattered air and stool filled loops of colon are seen; no abnormal dilatation of small bowel loops is seen to suggest small bowel obstruction. No free intra-abdominal air is identified, though evaluation for free air is limited on a single supine view. The visualized osseous structures are within normal limits; the sacroiliac joints are unremarkable in appearance. The visualized lung bases are essentially clear. IMPRESSION: Enteric tube noted ending overlying the body of the stomach. Electronically Signed   By: Garald Balding M.D.   On: 07/12/2015 23:58   Mr Jodene Nam Head/brain Wo Cm  07/13/2015  CLINICAL DATA:  Right-sided weakness and difficulty speaking EXAM: MRI HEAD  WITHOUT CONTRAST MRA HEAD WITHOUT CONTRAST TECHNIQUE: Multiplanar, multiecho pulse sequences of the brain and surrounding structures were obtained without intravenous contrast. Angiographic images of the head were obtained using MRA technique without contrast. COMPARISON:  Multiple recent head CT FINDINGS: Motion degraded study which could obscure pathology. MRI HEAD FINDINGS Calvarium and upper cervical spine: No focal marrow signal abnormality. Orbits: Negative. Sinuses and Mastoids: Chronic sinusitis with left predominant moderate mucosal thickening. Bilateral mastoid fluid or mucosal thickening with clear nasopharynx. Brain: Restricted diffusion which is most confluent in the left MCA territory inferior division, including the posterior insula, posterior putamen, patchy deep white matter tracts, and lateral left temporal lobe. There is patchy infarcts along the high left frontal and low left parietal lobes. Well-developed cytotoxic edema without midline shift or herniation. No suspected hemorrhagic conversion; susceptibility artifact at the right splenium of the corpus callosum and at the base of the left temporal parietal infarct is attributed to developmental venous anomalies based on prior head CTs. No hydrocephalus. No evidence of major vessel occlusion. MRA HEAD FINDINGS Severely limited due to motion, with doubling or distortion of all vessels. This could easily obscure stenosis after occlusion. An aneurysm would likely be missed. Fetal type right PCA.  No other notable anatomic variant. There is symmetric signal within the bilateral MCA branches with no  residual narrowing seen at the site of recent left M1 occlusion. IMPRESSION: 1. Acute infarct of roughly 1/3 left MCA territory, most confluent in the inferior territory. No suspected hemorrhagic conversion. 2. Motion degraded study which particularly affects the MRA. The re- opened left M1 segment remains patent. No evidence of proximal stenosis or  occlusion. 3. 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 Electronically Signed   By: Monte Fantasia M.D.   On: 07/13/2015 19:08    ASSESSMENT / PLAN:  NEUROLOGIC A: Acute lt MCA Ischemic Stroke s/p tPA and catheter-directed clot retrieval. P:   Per neuro    PULMONARY A:Post op resp failure - resolved P:    pulm hygiene   CARDIOVASCULAR A: Hx CHF - EF35% Af-RVR P:   Titrate  neo gtt to off - 90 sys ok Digoxin per cards eliquis per neuro  Dys 3 diet/ PT consult  Kara Mead MD. FCCP. [facility] Pager [phone] If no response call [phone]    07/14/2015  11:38 AM

## 2015-07-15 DIAGNOSIS — J9601 Acute respiratory failure with hypoxia: Secondary | ICD-10-CM

## 2015-07-15 LAB — CBC
HEMATOCRIT: 31.4 % — AB (ref 39.0–52.0)
HEMOGLOBIN: 10 g/dL — AB (ref 13.0–17.0)
MCH: 29.1 pg (ref 26.0–34.0)
MCHC: 31.8 g/dL (ref 30.0–36.0)
MCV: 91.3 fL (ref 78.0–100.0)
Platelets: 137 10*3/uL — ABNORMAL LOW (ref 150–400)
RBC: 3.44 MIL/uL — ABNORMAL LOW (ref 4.22–5.81)
RDW: 14.2 % (ref 11.5–15.5)
WBC: 5.5 10*3/uL (ref 4.0–10.5)

## 2015-07-15 LAB — GLUCOSE, CAPILLARY
GLUCOSE-CAPILLARY: 91 mg/dL (ref 65–99)
GLUCOSE-CAPILLARY: 113 mg/dL — AB (ref 65–99)
Glucose-Capillary: 107 mg/dL — ABNORMAL HIGH (ref 65–99)
Glucose-Capillary: 85 mg/dL (ref 65–99)

## 2015-07-15 LAB — BASIC METABOLIC PANEL
ANION GAP: 9 (ref 5–15)
BUN: 9 mg/dL (ref 6–20)
CO2: 24 mmol/L (ref 22–32)
Calcium: 8.2 mg/dL — ABNORMAL LOW (ref 8.9–10.3)
Chloride: 111 mmol/L (ref 101–111)
Creatinine, Ser: 0.83 mg/dL (ref 0.61–1.24)
GFR calc Af Amer: >60 mL/min (ref >60)
GLUCOSE: 87 mg/dL (ref 65–99)
POTASSIUM: 3.9 mmol/L (ref 3.5–5.1)
Sodium: 144 mmol/L (ref 135–145)

## 2015-07-15 LAB — TSH: TSH: 0.889 u[IU]/mL (ref 0.350–4.500)

## 2015-07-15 MED ORDER — ENSURE ENLIVE PO LIQD
237.0000 mL | Freq: Two times a day (BID) | ORAL | Status: DC
  Administered 2015-07-15 – 2015-07-18 (×5): 237 mL via ORAL
  Filled 2015-07-15 (×8): qty 237

## 2015-07-15 MED ORDER — ADULT MULTIVITAMIN W/MINERALS CH
1.0000 | ORAL_TABLET | Freq: Every day | ORAL | Status: DC
  Administered 2015-07-15 – 2015-07-18 (×4): 1 via ORAL
  Filled 2015-07-15 (×4): qty 1

## 2015-07-15 MED ORDER — AMIODARONE HCL 200 MG PO TABS
200.0000 mg | ORAL_TABLET | Freq: Two times a day (BID) | ORAL | Status: DC
  Administered 2015-07-15 – 2015-07-17 (×5): 200 mg via ORAL
  Filled 2015-07-15 (×5): qty 1

## 2015-07-15 MED ORDER — ATORVASTATIN CALCIUM 10 MG PO TABS
20.0000 mg | ORAL_TABLET | Freq: Every day | ORAL | Status: DC
  Administered 2015-07-15 – 2015-07-18 (×4): 20 mg via ORAL
  Filled 2015-07-15 (×4): qty 2

## 2015-07-15 NOTE — Anesthesia Postprocedure Evaluation (Signed)
Anesthesia Post Note  Patient: Phillip Solis  Procedure(s) Performed: Procedure(s) (LRB): RADIOLOGY WITH ANESTHESIA (N/A)  Patient location during evaluation: NICU Anesthesia Type: General Level of consciousness: sedated and patient remains intubated per anesthesia plan Pain management: pain level controlled Vital Signs Assessment: vitals unstable Respiratory status: patient remains intubated per anesthesia plan Cardiovascular status: stable Anesthetic complications: no    Last Vitals:  Filed Vitals:   07/15/15 0700 07/15/15 0800  BP: 126/76   Pulse: 99   Temp:  36.6 C  Resp: 22     Last Pain:  Filed Vitals:   07/15/15 0804  PainSc: 0-No pain                 Frannie Shedrick,JAMES TERRILL

## 2015-07-15 NOTE — Progress Notes (Signed)
STROKE TEAM PROGRESS NOTE   SUBJECTIVE (INTERVAL HISTORY)  Patient up in chair. States he is doing will; just walked around the unit with therapy and reports he did well. Able to verbalize that he had a stroke, that his wife's birthday was Sunday and he missed the party.   OBJECTIVE Temp:  [97.3 F (36.3 C)-98.6 F (37 C)] 97.9 F (36.6 C) (03/07 0800) Pulse Rate:  [80-141] 102 (03/07 0800) Cardiac Rhythm:  [-] Atrial fibrillation (03/07 0800) Resp:  [16-31] 18 (03/07 0800) BP: (92-139)/(53-96) 139/53 mmHg (03/07 0800) SpO2:  [91 %-98 %] 95 % (03/07 0800) Weight:  [75.3 kg (166 lb 0.1 oz)] 75.3 kg (166 lb 0.1 oz) (03/07 0500)  CBC:   Recent Labs Lab 07/11/15 2152  07/12/15 0558  07/14/15 0409 07/15/15 0310  WBC 7.1  --  8.3  < > 7.3 5.5  NEUTROABS 2.8  --  6.3  --   --   --   HGB 13.8  < > 12.1*  < > 10.8* 10.0*  HCT 40.4  < > 35.2*  < > 33.4* 31.4*  MCV 90.8  --  88.9  < > 91.5 91.3  PLT 178  --  160  < > 142* 137*  < > = values in this interval not displayed.  Basic Metabolic Panel:   Recent Labs Lab 07/14/15 0409 07/15/15 0310  NA 145 144  K 3.8 3.9  CL 115* 111  CO2 24 24  GLUCOSE 107* 87  BUN 8 9  CREATININE 0.88 0.83  CALCIUM 7.9* 8.2*    Lipid Panel:     Component Value Date/Time   CHOL 182 07/12/2015 0558   TRIG 209* 07/12/2015 0558   TRIG 207* 07/12/2015 0558   HDL 21* 07/12/2015 0558   CHOLHDL 8.7 07/12/2015 0558   VLDL 41* 07/12/2015 0558   LDLCALC 120* 07/12/2015 0558   HgbA1c:  Lab Results  Component Value Date   HGBA1C 5.9* 07/12/2015   Urine Drug Screen:     Component Value Date/Time   LABOPIA NONE DETECTED 07/11/2015 2243   COCAINSCRNUR NONE DETECTED 07/11/2015 2243   LABBENZ NONE DETECTED 07/11/2015 2243   AMPHETMU NONE DETECTED 07/11/2015 2243   THCU NONE DETECTED 07/11/2015 2243   LABBARB NONE DETECTED 07/11/2015 2243      IMAGING  Ct Head Wo Contrast 07/11/2015   1. No acute intracranial pathology seen on CT.  2.  Mild cortical volume loss and scattered small vessel ischemic microangiopathy.   CT head 07/11/2015   Dense LEFT MCA and LEFT insular ribbon sign consistent with acute LEFT middle cerebral artery territory infarct.   CTA neck   07/11/2015   Negative.   CTA head 07/11/2015   Emergent distal LEFT M1 occlusion. 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   Cerebral angiogram 07/11/2015 S/P bilateral common carotid arteriogram,followed by endovascular revascularization of occluded Lt MCA M1 seg With x 2 passes with the solitaire FR 17m x 40 mm retrieval device And 5 mg of superselective IA tpa. TICI 2 b reperfusion achieved. Small slow flow Lt CC fistula noted with no compromise of distal ICA flow  Ct Head Wo Contrast 07/12/2015   Faint contrast staining LEFT posterior temporal lobe, with improved visualization gray-white matter junction, no hemorrhage.   MRI and MRA brain 07/13/2015 1. Acute infarct of roughly 1/3 left MCA territory, most confluent in the inferior territory. No suspected hemorrhagic conversion. 2. Motion degraded study which particularly affects the MRA. The re-opened left M1 segment  remains patent. No evidence of proximal stenosis or occlusion. 3. 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   Ct Head Wo Contrast 07/11/2015   1. No acute intracranial pathology seen on CT.  2. Mild cortical volume loss and scattered small vessel ischemic microangiopathy.   Portable Chest Xray 07/12/2015   1. Endotracheal tube seen ending 1-2 cm above the carina. This could be retracted 2 cm. 2. Minimal bibasilar atelectasis noted. Mild vascular congestion seen. Radiology recommendation was to retract 2 cm  07/13/2015 Minimal RIGHT basilar atelectasis.  Tip of endotracheal tube projects 4.4 cm above carina.  07/14/2015 1. Mild mediastinal prominence. This may be related AP technique and patient rotation. Follow-up PA and lateral chest x-ray  suggested to further evaluate. 2. Low lung volumes. Mild bibasilar atelectasis and/or infiltrates. Small left pleural effusion. 3. Stable mild cardiomegaly. No pulmonary venous congestion .  2D Echocardiogram  - Left ventricle: The cavity size was normal. Systolic function wasmildly reduced. The estimated ejection fraction was in the rangeof 45% to 50%. There is akinesis of the basal-midinferolateralmyocardium. Doppler parameters are consistent with abnormal leftventricular relaxation (grade 1 diastolic dysfunction). - Aortic valve: There was trivial regurgitation. - Mitral valve: There was mild regurgitation. Impressions:  EF improved from prior. No cardiac source of emboli wasindentified.   PHYSICAL EXAM Gen: NAD Chest:  CTA                   CV: no MRG, no carotid bruits, no peripheral edema Extrem:  Warm to touch, pulses intact  Neurological Exam:Awake and alert oriented 3. Mild expressive aphasia with word finding difficulties and mild perseveration. Nonfluent speech. Difficulty following 3 step commands. Able to show 2 fingers. Able to name.   Mental Status:  Awake and alert. VFF - could count fingers. R facial weakness.   Motor/Sensory:  mild R hemiparesis. No arm drift. Able to raise both legs off bed.   Coordination/Gait:  No UE ataxia. Finger-nose-finger ok.  Gair Deferred during exam.   ASSESSMENT/PLAN Mr. BART ASHFORD is a 70 y.o. male with history of paroxysmal atrial fibrillation (not anticoagulated), palpitations, and congestive heart failure with ejection fraction 35-40% presenting with right hemiparesis and speech difficulties. He did receive IV TPA at 2230 on Friday, 07/11/2015. Taken to intervention. He received mechanical thrombectomy and IA TPA with TICI2B revascularization. He has done well but has mild residual expressive aphasia. He passed swallow eval and started on anticoagulation as well as rate control medications for atrial fibrillation.   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 Infarct felt to be embolic secondary to atrial fibrillation.  Resultant right hemiparesis, mild receptive aphasia with global perseveration, dysphagia  MRI  Left MCA infarct one third territory  MRA left M1 segment remains open  CTA of the neck negative  2D Echo  EF 45-50%, no source of embolus  LDL - 120  HgbA1c 5.9  VTE prophylaxis - SCDs DIET DYS 3 Room service appropriate?: Yes; Fluid consistency:: Nectar Thick  aspirin 81 mg daily prior to admission, started on Eliquis (apixaban) daily   Patient counseled to be compliant with his antithrombotic medications  Ongoing aggressive stroke risk factor management  Therapy recommendations: CIR. 3N1. Rehab admissions coordinator following.   Disposition: Pending. (lives at home with wife in [place])  Transfer to the floor. Await CIR bed once can tolerate therapies  Respiratory failure,  resolved  Intubated for neuro intervention  Atrial Fibrillation with RVR  Initially diagnosed during the endoscopy procedure in 2011 without recurrence  Home anticoagulation:  None Due to low CHA2DS2-VASc score of 0  CHA2DS2-VASc Score = 5, ?2 oral anticoagulation recommended  Age in Years:  50-74   +1  Sex:  Male   0  Hypertension History:  yes   +1     Diabetes Mellitus:  0   Congestive Heart Failure History:  yes   +1  Vascular Disease History:  0     Stroke/TIA/Thromboembolism History:  yes   +2  Treated with neo drip. No amiodarone, given risk for recurrent stroke with A. Fib to normal sinus rhythm conversion, particularly ANTICOAGULATION. No beta blocker or calcium channel blocker need to prioritize adequate blood pressure to allow cerebral perfusion.  Started on digoxin by cardiology.    Started on Eliquis in the hospital. continue at Discharge.  Converted to NSR over night - will  keep on tele on the floor while awaiting rehab bed.   Hypertension  Blood pressure tends to run mildly low. (Low ejection fraction)  Hyperlipidemia  Home meds: Niaspan prior to admission - not reordered  LDL 120, goal < 70  Add lipitor 20 mg daily  Continue statin at discharge  Other Stroke Risk Factors  Advanced age  Cigarette smoker, quit smoking 42 years ago.  ETOH use  Other Active Problems  Mild anemia  CHF with LV systolic dysfunction diagnosed in 2016. No coronary artery disease. Managed with Entresto and bisoprolol and EF has improved  Hospital day # [place] for Pager information 07/15/2015 9:44 AM  I have personally examined this patient, reviewed notes, independently viewed imaging studies, participated in medical decision making and plan of care. I have made any additions or clarifications directly to the above note. Agree with note above. He remains at risk for recurrent stroke, TIA and needs ongoing anticoagulation. Mobilize out of bed. Transfer to the floor.  Antony Contras, MD Medical Director [facility] Pager: [phone] 07/15/2015 2:22 PM   To contact Stroke Continuity provider, please refer to [email or web address] After hours, contact General Neurology

## 2015-07-15 NOTE — Care Management Important Message (Signed)
Important Message  Patient Details  Name: Phillip Solis MRN: [id number] Date of Birth: Oct 03, 1945   Medicare Important Message Given:  Yes    Loann Quill 07/15/2015, 11:45 AM

## 2015-07-15 NOTE — Progress Notes (Addendum)
SUBJECTIVE:  Sitting up in a chair with no complaints  OBJECTIVE:   Vitals:   Filed Vitals:   07/15/15 0500 07/15/15 0600 07/15/15 0700 07/15/15 0800  BP: 128/73 113/96 126/76 139/53  Pulse: 97 94 99 102  Temp:    97.9 F (36.6 C)  TempSrc:    Oral  Resp: '27 25 22 18  '$ Height:      Weight: 166 lb 0.1 oz (75.3 kg)     SpO2: 94% 95% 93% 95%   I&O's:   Intake/Output Summary (Last 24 hours) at 07/15/15 1002 Last data filed at 07/15/15 0924  Gross per 24 hour  Intake      0 ml  Output    850 ml  Net   -850 ml   TELEMETRY: Reviewed telemetry pt in NSR:     PHYSICAL EXAM General: Well developed, well nourished, in no acute distress Head: Eyes PERRLA, No xanthomas.   Normal cephalic and atramatic  Lungs:   Clear bilaterally to auscultation and percussion. Heart:   HRRR S1 S2 Pulses are 2+ & equal. Abdomen: Bowel sounds are positive, abdomen soft and non-tender without masses  Extremities:   No clubbing, cyanosis or edema.  DP +1 Neuro: Alert and oriented X 3. Psych:  Good affect, responds appropriately   LABS: Basic Metabolic Panel:  Recent Labs  07/14/15 0409 07/15/15 0310  NA 145 144  K 3.8 3.9  CL 115* 111  CO2 24 24  GLUCOSE 107* 87  BUN 8 9  CREATININE 0.88 0.83  CALCIUM 7.9* 8.2*   Liver Function Tests:  Recent Labs  07/13/15 0826  AST 39  ALT 19  ALKPHOS 57  BILITOT 1.0  PROT 5.1*  ALBUMIN 2.7*   No results for input(s): LIPASE, AMYLASE in the last 72 hours. CBC:  Recent Labs  07/14/15 0409 07/15/15 0310  WBC 7.3 5.5  HGB 10.8* 10.0*  HCT 33.4* 31.4*  MCV 91.5 91.3  PLT 142* 137*   Cardiac Enzymes: No results for input(s): CKTOTAL, CKMB, CKMBINDEX, TROPONINI in the last 72 hours. BNP: Invalid input(s): POCBNP D-Dimer: No results for input(s): DDIMER in the last 72 hours. Hemoglobin A1C: No results for input(s): HGBA1C in the last 72 hours. Fasting Lipid Panel: No results for input(s): CHOL, HDL, LDLCALC, TRIG, CHOLHDL,  LDLDIRECT in the last 72 hours. Thyroid Function Tests: No results for input(s): TSH, T4TOTAL, T3FREE, THYROIDAB in the last 72 hours.  Invalid input(s): FREET3 Anemia Panel: No results for input(s): VITAMINB12, FOLATE, FERRITIN, TIBC, IRON, RETICCTPCT in the last 72 hours. Coag Panel:   Lab Results  Component Value Date   INR 0.96 07/11/2015   INR 0.99 07/15/2014    RADIOLOGY: Ct Angio Head W/cm &/or Wo Cm  07/11/2015  CLINICAL DATA:  Code stroke, RIGHT facial droop. Slurred speech and uneven pupils. History of atrial fibrillation. EXAM: CT ANGIOGRAPHY HEAD TECHNIQUE: Multidetector CT imaging of the head was performed using the standard protocol during bolus administration of intravenous contrast. Multiplanar CT image reconstructions and MIPs were obtained to evaluate the vascular anatomy. CONTRAST:  17m OMNIPAQUE IOHEXOL 350 MG/ML SOLN COMPARISON:  CT head July 11, 2015 at 2205 hours FINDINGS: CT HEAD Mildly dense LEFT MCA with LEFT insular ribbon sign. No intraparenchymal hemorrhage, mass effect, midline shift. Ventricles and sulci are overall normal for patient's age, cavum septum pellucidum is a normal variant. Subcentimeter density RIGHT parasagittal parietal lobe suggests developmental venous anomaly. No abnormal extra-axial fluid collections. Basal cisterns are patent. Ocular globes  and orbital contents are normal. Paranasal sinuses and mastoid air cells are well aerated. No skull fracture. Multiple prominent vascular lakes at the convexity. Patient is edentulous. CTA NECK Aortic arch: Normal appearance of the thoracic arch, normal branch pattern. Mild calcific atherosclerosis of the aortic arch. The origins of the innominate, left Common carotid artery and subclavian artery are widely patent. Right carotid system: Common carotid artery is widely patent, coursing in a straight line fashion. Normal appearance of the carotid bifurcation without hemodynamically significant stenosis by NASCET  criteria. Normal appearance of the included internal carotid artery. Left carotid system: Common carotid artery is widely patent, coursing in a straight line fashion. Normal appearance of the carotid bifurcation without hemodynamically significant stenosis by NASCET criteria. Mild eccentric calcific atherosclerosis. Normal appearance of the included internal carotid artery. Vertebral arteries:Left vertebral artery is dominant. Normal appearance of the vertebral arteries, which appear widely patent. Skeleton: No acute osseous process though bone windows have not been submitted. Moderate to severe multilevel degenerative discs resulting in moderate to severe neural foraminal narrowing C3-4, C4-5. Old mild T2 compression fracture with scoliosis. No destructive bony lesions. Other neck: Soft tissues of the neck are nonacute though, not tailored for evaluation. CTA HEAD Anterior circulation: Normal appearance of the cervical internal carotid arteries, petrous, cavernous and supra clinoid internal carotid arteries. Widely patent anterior communicating artery. LEFT A1 is developmentally dominant. Acute distal LEFT M1 segment occlusion. Minimal collateral present on CTA. RIGHT middle cerebral artery vessels are patent. Posterior circulation: Normal appearance of the vertebral arteries, vertebrobasilar junction and basilar artery, as well as main branch vessels. Normal appearance of the posterior cerebral arteries. Fetal origin RIGHT posterior cerebral artery. No hemodynamically significant stenosis, dissection, luminal irregularity, contrast extravasation or aneurysm within the anterior nor posterior circulation. 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 CTA neck:  Negative. CTA head: Emergent distal LEFT M1 occlusion. 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 Acute findings discussed with and reconfirmed by Dr.MCNEILL  [facility] on 07/11/2015 at 10:23 pm. Electronically Signed   By: Elon Alas M.D.   On: 07/11/2015 23:00   Dg Chest 1 View  07/13/2015  CLINICAL DATA:  Pulmonary edema, history atrial fibrillation, palpitations, former smoker, stroke, dilated non ischemic cardiomyopathy EXAM: CHEST 1 VIEW COMPARISON:  Portable exam 0929 hours compared to 07/12/2015 FINDINGS: Tip of endotracheal tube projects 4.4 cm above carina. Nasogastric tube extends into stomach. Normal heart size, mediastinal contours, and pulmonary vascularity. Minimal RIGHT basilar atelectasis. Lungs otherwise clear. No pleural effusion or pneumothorax. Question RIGHT nipple shadow not seen prior chest radiograph. IMPRESSION: Minimal RIGHT basilar atelectasis. Electronically Signed   By: Lavonia Dana M.D.   On: 07/13/2015 09:47   Ct Head Wo Contrast  07/13/2015  CLINICAL DATA:  Follow-up examination for stroke. Status post catheter directed revascularization with intra-arterial tPA. EXAM: CT HEAD WITHOUT CONTRAST TECHNIQUE: Contiguous axial images were obtained from the base of the skull through the vertex without intravenous contrast. COMPARISON:  Multiple previous studies from 07/11/2015 and 07/12/2015. FINDINGS: Age-related cerebral atrophy with chronic small vessel ischemic type changes again noted. There is evolving patchy hypodensity within the left MCA territory involving the left insular region with extension into the left lentiform nucleus, compatible with evolving left MCA territory infarct focal hypodensity within the left temporal region consistent with evolving ischemia. Probable additional patchy hypodensity with subtle loss of gray-white matter differentiation more superficially and posteriorly within the posterior left  frontal lobe. No significant mass effect or evidence for hemorrhagic transformation. Previously seen dense left M1 segment no longer visualized. No other acute or evolving large vessel territory infarct. No intracranial  hemorrhage. No mass lesion, midline shift, or mass effect. No hydrocephalus. No extra-axial fluid collection. Scalp soft tissues within normal limits. No acute abnormality about the orbits. Scattered mucosal thickening within the paranasal sinuses. Mastoid air cells grossly clear. Middle ear cavities clear. Calvarium intact. IMPRESSION: 1. Evolving patchy acute left MCA territory ischemic infarcts as above. No significant mass effect or evidence of hemorrhagic transformation. 2. No other new acute intracranial process. 3. 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 Electronically Signed   By: Jeannine Boga M.D.   On: 07/13/2015 01:53   Ct Head Wo Contrast  07/12/2015  CLINICAL DATA:  Follow-up LEFT middle cerebral artery stroke, status post LEFT middle cerebral endovascular revascularization. EXAM: CT HEAD WITHOUT CONTRAST TECHNIQUE: Contiguous axial images were obtained from the base of the skull through the vertex without intravenous contrast. COMPARISON:  CT head July 11, 2015 FINDINGS: Faint contrast staining of LEFT posterior temporal lobe, improved visualization of insula gray-white matter junction. Postcontrast imaging without evidence of hemorrhage, mass effect or midline shift. Ventricles and sulci are normal for patient's age. Subcentimeter enhancement RIGHT parasagittal parietal lobe compatible with developmental venous anomaly. No abnormal extra-axial fluid collections. Minimal calcific atherosclerosis the carotid siphons. Old RIGHT medial orbital blowout fracture. Mild paranasal sinus mucosal thickening, life-support lines in place. Mastoid air cells are well aerated. Prominent vascular lakes at the vertex. IMPRESSION: Faint contrast staining LEFT posterior temporal lobe, with improved visualization gray-white matter junction, no hemorrhage. Electronically Signed   By: Elon Alas M.D.   On: 07/12/2015 02:05   Ct Head Wo Contrast  07/11/2015  CLINICAL DATA:  Code stroke.  Right-sided facial droop and slurred speech. Uneven pupils. Initial encounter. EXAM: CT HEAD WITHOUT CONTRAST TECHNIQUE: Contiguous axial images were obtained from the base of the skull through the vertex without intravenous contrast. COMPARISON:  CT of the head performed 12-12-202016 FINDINGS: There is no evidence of acute infarction, mass lesion, or intra- or extra-axial hemorrhage on CT. Prominence of the ventricles and sulci suggests mild cortical volume loss. Scattered subcortical and periventricular white matter change likely reflects small vessel ischemic microangiopathy, relatively stable from the prior study. Mild cerebellar atrophy is noted. The brainstem and fourth ventricle are within normal limits. The basal ganglia are unremarkable in appearance. The cerebral hemispheres demonstrate grossly normal gray-white differentiation. No mass effect or midline shift is seen. There is no evidence of fracture; chronic scattered calvarial lucencies are again noted at the vertex, relatively stable in appearance. The orbits are within normal limits. The paranasal sinuses and mastoid air cells are well-aerated. No significant soft tissue abnormalities are seen. IMPRESSION: 1. No acute intracranial pathology seen on CT. 2. Mild cortical volume loss and scattered small vessel ischemic microangiopathy. These results were called by telephone at the time of interpretation on 07/11/2015 at 10:18 pm to Dr. Leonel Ramsay, who verbally acknowledged these results. Electronically Signed   By: Garald Balding M.D.   On: 07/11/2015 22:21   Ct Angio Neck W/cm &/or Wo/cm  07/11/2015  CLINICAL DATA:  Code stroke, RIGHT facial droop. Slurred speech and uneven pupils. History of atrial fibrillation. EXAM: CT ANGIOGRAPHY HEAD TECHNIQUE: Multidetector CT imaging of the head was performed using the standard protocol during bolus administration of intravenous contrast. Multiplanar CT image reconstructions and MIPs were obtained to evaluate the  vascular anatomy. CONTRAST:  22m OMNIPAQUE IOHEXOL 350 MG/ML SOLN COMPARISON:  CT head July 11, 2015 at 2205 hours FINDINGS: CT HEAD Mildly dense LEFT MCA with LEFT insular ribbon sign. No intraparenchymal hemorrhage, mass effect, midline shift. Ventricles and sulci are overall normal for patient's age, cavum septum pellucidum is a normal variant. Subcentimeter density RIGHT parasagittal parietal lobe suggests developmental venous anomaly. No abnormal extra-axial fluid collections. Basal cisterns are patent. Ocular globes and orbital contents are normal. Paranasal sinuses and mastoid air cells are well aerated. No skull fracture. Multiple prominent vascular lakes at the convexity. Patient is edentulous. CTA NECK Aortic arch: Normal appearance of the thoracic arch, normal branch pattern. Mild calcific atherosclerosis of the aortic arch. The origins of the innominate, left Common carotid artery and subclavian artery are widely patent. Right carotid system: Common carotid artery is widely patent, coursing in a straight line fashion. Normal appearance of the carotid bifurcation without hemodynamically significant stenosis by NASCET criteria. Normal appearance of the included internal carotid artery. Left carotid system: Common carotid artery is widely patent, coursing in a straight line fashion. Normal appearance of the carotid bifurcation without hemodynamically significant stenosis by NASCET criteria. Mild eccentric calcific atherosclerosis. Normal appearance of the included internal carotid artery. Vertebral arteries:Left vertebral artery is dominant. Normal appearance of the vertebral arteries, which appear widely patent. Skeleton: No acute osseous process though bone windows have not been submitted. Moderate to severe multilevel degenerative discs resulting in moderate to severe neural foraminal narrowing C3-4, C4-5. Old mild T2 compression fracture with scoliosis. No destructive bony lesions. Other neck: Soft  tissues of the neck are nonacute though, not tailored for evaluation. CTA HEAD Anterior circulation: Normal appearance of the cervical internal carotid arteries, petrous, cavernous and supra clinoid internal carotid arteries. Widely patent anterior communicating artery. LEFT A1 is developmentally dominant. Acute distal LEFT M1 segment occlusion. Minimal collateral present on CTA. RIGHT middle cerebral artery vessels are patent. Posterior circulation: Normal appearance of the vertebral arteries, vertebrobasilar junction and basilar artery, as well as main branch vessels. Normal appearance of the posterior cerebral arteries. Fetal origin RIGHT posterior cerebral artery. No hemodynamically significant stenosis, dissection, luminal irregularity, contrast extravasation or aneurysm within the anterior nor posterior circulation. 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 CTA neck:  Negative. CTA head: Emergent distal LEFT M1 occlusion. 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 Acute findings discussed with and re[facility] 10:23 pm. Electronically Signed   By: CElon AlasM.D.   On: 07/11/2015 23:00   Mr Brain Wo Contrast  07/13/2015  CLINICAL DATA:  Right-sided weakness and difficulty speaking EXAM: MRI HEAD WITHOUT CONTRAST MRA HEAD WITHOUT CONTRAST TECHNIQUE: Multiplanar, multiecho pulse sequences of the brain and surrounding structures were obtained without intravenous contrast. Angiographic images of the head were obtained using MRA technique without contrast. COMPARISON:  Multiple recent head CT FINDINGS: Motion degraded study which could obscure pathology. MRI HEAD FINDINGS Calvarium and upper cervical spine: No focal marrow signal abnormality. Orbits: Negative. Sinuses and Mastoids: Chronic sinusitis with left predominant moderate mucosal thickening. Bilateral  mastoid fluid or mucosal thickening with clear nasopharynx. Brain: Restricted diffusion which is most confluent in the left MCA territory inferior division, including the posterior insula, posterior putamen, patchy deep white matter tracts, and lateral left temporal lobe. There is patchy infarcts along the high left frontal and low left parietal lobes. Well-developed cytotoxic edema without midline shift  or herniation. No suspected hemorrhagic conversion; susceptibility artifact at the right splenium of the corpus callosum and at the base of the left temporal parietal infarct is attributed to developmental venous anomalies based on prior head CTs. No hydrocephalus. No evidence of major vessel occlusion. MRA HEAD FINDINGS Severely limited due to motion, with doubling or distortion of all vessels. This could easily obscure stenosis after occlusion. An aneurysm would likely be missed. Fetal type right PCA.  No other notable anatomic variant. There is symmetric signal within the bilateral MCA branches with no residual narrowing seen at the site of recent left M1 occlusion. IMPRESSION: 1. Acute infarct of roughly 1/3 left MCA territory, most confluent in the inferior territory. No suspected hemorrhagic conversion. 2. Motion degraded study which particularly affects the MRA. The re- opened left M1 segment remains patent. No evidence of proximal stenosis or occlusion. 3. 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 Electronically Signed   By: Monte Fantasia M.D.   On: 07/13/2015 19:08   Dg Chest Port 1 View  07/14/2015  CLINICAL DATA:  Respiratory failure. EXAM: PORTABLE CHEST 1 VIEW COMPARISON:  07/13/2015, 18-Apr-202016, 01/11/2010. FINDINGS: Interim extubation and removal of NG tube. Mild mediastinal prominence, this may be related AP technique and patient rotation. Follow-up PA and lateral chest x-ray suggested. Cardiomegaly. No pulmonary venous congestion. Low lung volumes  with mild bibasilar atelectasis and/or infiltrates. Small left pleural effusion. No pneumothorax. IMPRESSION: 1. Mild mediastinal prominence. This may be related AP technique and patient rotation. Follow-up PA and lateral chest x-ray suggested to further evaluate. 2. Low lung volumes. Mild bibasilar atelectasis and/or infiltrates. Small left pleural effusion. 3. Stable mild cardiomegaly.  No pulmonary venous congestion . Electronically Signed   By: Marcello Moores  Register   On: 07/14/2015 07:28   Portable Chest Xray  07/12/2015  CLINICAL DATA:  Endotracheal tube placement.  Initial encounter. EXAM: PORTABLE CHEST 1 VIEW COMPARISON:  Chest radiograph performed 18-Apr-202016 FINDINGS: The patient's endotracheal tube is seen ending 1-2 cm above the carina. This could be retracted 2 cm. Minimal bibasilar atelectasis is noted. Mild vascular congestion is noted. No pleural effusion or pneumothorax is seen. The cardiomediastinal silhouette is borderline normal in size. No acute osseous abnormalities are identified. IMPRESSION: 1. Endotracheal tube seen ending 1-2 cm above the carina. This could be retracted 2 cm. 2. Minimal bibasilar atelectasis noted. Mild vascular congestion seen. Electronically Signed   By: Garald Balding M.D.   On: 07/12/2015 02:51   Dg Abd Portable 1v  07/12/2015  CLINICAL DATA:  Orogastric tube placement.  Initial encounter. EXAM: PORTABLE ABDOMEN - 1 VIEW COMPARISON:  None. FINDINGS: The patient's enteric tube is noted ending overlying the body of the stomach. The side-port is noted about the fundus of the stomach. The visualized bowel gas pattern is unremarkable. Scattered air and stool filled loops of colon are seen; no abnormal dilatation of small bowel loops is seen to suggest small bowel obstruction. No free intra-abdominal air is identified, though evaluation for free air is limited on a single supine view. The visualized osseous structures are within normal limits; the sacroiliac joints are  unremarkable in appearance. The visualized lung bases are essentially clear. IMPRESSION: Enteric tube noted ending overlying the body of the stomach. Electronically Signed   By: Garald Balding M.D.   On: 07/12/2015 23:58   Mr Jodene Nam Head/brain Wo Cm  07/13/2015  CLINICAL DATA:  Right-sided weakness and difficulty speaking EXAM: MRI HEAD WITHOUT CONTRAST MRA HEAD  WITHOUT CONTRAST TECHNIQUE: Multiplanar, multiecho pulse sequences of the brain and surrounding structures were obtained without intravenous contrast. Angiographic images of the head were obtained using MRA technique without contrast. COMPARISON:  Multiple recent head CT FINDINGS: Motion degraded study which could obscure pathology. MRI HEAD FINDINGS Calvarium and upper cervical spine: No focal marrow signal abnormality. Orbits: Negative. Sinuses and Mastoids: Chronic sinusitis with left predominant moderate mucosal thickening. Bilateral mastoid fluid or mucosal thickening with clear nasopharynx. Brain: Restricted diffusion which is most confluent in the left MCA territory inferior division, including the posterior insula, posterior putamen, patchy deep white matter tracts, and lateral left temporal lobe. There is patchy infarcts along the high left frontal and low left parietal lobes. Well-developed cytotoxic edema without midline shift or herniation. No suspected hemorrhagic conversion; susceptibility artifact at the right splenium of the corpus callosum and at the base of the left temporal parietal infarct is attributed to developmental venous anomalies based on prior head CTs. No hydrocephalus. No evidence of major vessel occlusion. MRA HEAD FINDINGS Severely limited due to motion, with doubling or distortion of all vessels. This could easily obscure stenosis after occlusion. An aneurysm would likely be missed. Fetal type right PCA.  No other notable anatomic variant. There is symmetric signal within the bilateral MCA branches with no residual narrowing  seen at the site of recent left M1 occlusion. IMPRESSION: 1. Acute infarct of roughly 1/3 left MCA territory, most confluent in the inferior territory. No suspected hemorrhagic conversion. 2. Motion degraded study which particularly affects the MRA. The re- opened left M1 segment remains patent. No evidence of proximal stenosis or occlusion. 3. 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 Electronically Signed   By: Monte Fantasia M.D.   On: 07/13/2015 19:08      ASSESSMENT/PLAN: 72M with pAF and HFrEF (EF nadir 35-40% with improvement to 40-45%) who presents with an acute ischemic stroke on 07/11/15 and developed AF with RVR. It is probable that his stroke was AF related. His CHADSVASC is now 4 (CHF, age, CVA) and requires systemic anticoagulation.  He has converted to NSR with HR 80-105bpm.    1. Started on Apixaban for anticaoagulation 2. Since he has converted to NSR will load Amio '200mg'$  BID to maintain NSR.  Will d/c digoxin. 3. Check baseline PFTs with DLCO since loading Amio 4. LFTs are normal.  Will check TSH for baseline since starting Alonna Buckler, MD  07/15/2015  10:02 AM

## 2015-07-15 NOTE — Progress Notes (Signed)
Nutrition Follow-up   INTERVENTION:   Ensure Enlive po BID, each supplement provides 350 kcal and 20 grams of protein  NUTRITION DIAGNOSIS:   Inadequate oral intake related to poor appetite as evidenced by meal completion < 50%. Ongoing.   GOAL:   Patient will meet greater than or equal to 90% of their needs Progressing.   MONITOR:   PO intake, Supplement acceptance  ASSESSMENT:   70 y/o man with hx of CHF admitted 3/3 with ischemic stroke now s/p IR clot retrieval.  Pt extubated now on diet. Family at bedside and reports pt intake is usually good. Will try ensure.  Reports decreased appetite, no meals eaten yet.   Diet Order:  DIET DYS 3 Room service appropriate?: Yes; Fluid consistency:: Thin  Skin:  Reviewed, no issues  Last BM:  3/6  Height:   Ht Readings from Last 1 Encounters:  07/12/15 6' (1.829 m)    Weight:   Wt Readings from Last 1 Encounters:  07/15/15 166 lb 0.1 oz (75.3 kg)    Ideal Body Weight:  80.91 kg  BMI:  Body mass index is 22.51 kg/(m^2).  Estimated Nutritional Needs:   Kcal:  1800-2000  Protein:  90-115 grams  Fluid:  > 1.8 L/day  EDUCATION NEEDS:   No education needs identified at this time  [place], [place], [place] Pager [phone] After Hours Pager

## 2015-07-15 NOTE — Progress Notes (Signed)
Physical Therapy Treatment Patient Details Name: Phillip Solis MRN: [id number] DOB: [birth year] Today's Date: 07/15/2015    History of Present Illness pt presents with L MCA Infarct post tpa and Bil Cerebral Angiogram with revascularization.  pt intubated 07/11/15 - 07/13/15 and now with A-fib.  pt with hx of A-fib and CHF.      PT Comments    Pt able to improve overall mobility with HR remaining in 110's throughout session today.  Pt continues to have difficulty following directions and maintaining attention to task.  Continue to feel pt would be a great candidate for CIR at [facility].  Will continue to follow.    Follow Up Recommendations  CIR     Equipment Recommendations  None recommended by PT    Recommendations for Other Services       Precautions / Restrictions Precautions Precautions: Fall;Other (comment) Precaution Comments: Watch HR.  Keep under 150 Restrictions Weight Bearing Restrictions: No    Mobility  Bed Mobility Overal bed mobility: Needs Assistance Bed Mobility: Supine to Sit     Supine to sit: Min guard     General bed mobility comments: pt with increased time to complete, but able to perform without physical A.    Transfers Overall transfer level: Needs assistance Equipment used: Rolling walker (2 wheeled) Transfers: Sit to/from Stand Sit to Stand: Min assist         General transfer comment: A for balance during transfers.  Ambulation/Gait Ambulation/Gait assistance: Min assist Ambulation Distance (Feet): 100 Feet Assistive device: Rolling walker (2 wheeled) Gait Pattern/deviations: Step-through pattern;Decreased stride length;Trunk flexed     General Gait Details: pt with difficulty coordinating use of RW with ambulation.  pt needs consistent MinA for balance, safety, and RW management.  pt has difficulty following directions during mobility tasks.     Stairs            Wheelchair Mobility    Modified Rankin (Stroke Patients  Only) Modified Rankin (Stroke Patients Only) Pre-Morbid Rankin Score: No symptoms Modified Rankin: Moderately severe disability     Balance Overall balance assessment: Needs assistance Sitting-balance support: No upper extremity supported;Feet supported Sitting balance-Leahy Scale: Fair     Standing balance support: During functional activity;Bilateral upper extremity supported Standing balance-Leahy Scale: Poor                      Cognition Arousal/Alertness: Awake/alert Behavior During Therapy: WFL for tasks assessed/performed Overall Cognitive Status: Impaired/Different from baseline Area of Impairment: Attention;Memory;Following commands;Safety/judgement;Awareness;Problem solving   Current Attention Level: Selective Memory: Decreased short-term memory Following Commands: Follows one step commands with increased time;Follows multi-step commands inconsistently Safety/Judgement: Decreased awareness of safety;Decreased awareness of deficits Awareness: Intellectual Problem Solving: Slow processing;Decreased initiation;Difficulty sequencing;Requires verbal cues;Requires tactile cues General Comments: pt with improved ability to follow directions today, but struggles with multi-step directions.      Exercises      General Comments        Pertinent Vitals/Pain Pain Assessment: No/denies pain    Home Living                      Prior Function            PT Goals (current goals can now be found in the care plan section) Acute Rehab PT Goals Patient Stated Goal: pt unable to state. PT Goal Formulation: With patient Time For Goal Achievement: 07/28/15 Potential to Achieve Goals: Good Progress towards PT goals: Progressing toward  goals    Frequency  Min 4X/week    PT Plan Current plan remains appropriate    Co-evaluation             End of Session Equipment Utilized During Treatment: Gait belt Activity Tolerance: Patient tolerated  treatment well Patient left: in chair;with call bell/phone within reach;with chair alarm set     Time: 7893-8101 PT Time Calculation (min) (ACUTE ONLY): 21 min  Charges:  $Gait Training: 8-22 mins                    G CodesCatarina Solis, [place] 07/15/2015, 12:03 PM

## 2015-07-15 NOTE — Care Management Note (Signed)
Case Management Note  Patient Details  Name: Phillip Solis MRN: [id number] Date of Birth: 1945-12-22  Subjective/Objective:     Pt admitted on 07/11/15 with acute ischemic stroke.  PTA, pt independent, lives with spouse.                 Action/Plan: CIR consult in progress.  Hopeful for dc to CIR pending insurance authorization.  If denied, pt will need SNF.   Expected Discharge Date:                  Expected Discharge Plan:  IP Rehab Facility  In-House Referral:  Clinical Social Work  Discharge planning Services  CM Consult  Post Acute Care Choice:    Choice offered to:     DME Arranged:    DME Agency:     HH Arranged:    [place] Agency:     Status of Service:  In process, will continue to follow  Medicare Important Message Given:  Yes Date Medicare IM Given:    Medicare IM give by:    Date Additional Medicare IM Given:    Additional Medicare Important Message give by:     If discussed at [place] of Stay Meetings, dates discussed:    Additional Comments:  Reinaldo Raddle, RN, BSN  Trauma/Neuro ICU Case Manager [phone]

## 2015-07-15 NOTE — Progress Notes (Signed)
I spoke with pt's wife by phone to discuss a possible inpt rehab admission pending insurance approval and bed availability when pt medically ready. I also discussed SNF rehab if I do not get insurance approval. I will initiate insurance authorization today. [phone]

## 2015-07-15 NOTE — Progress Notes (Signed)
Speech Language Pathology Treatment: Dysphagia; aphasia  Patient Details Name: Phillip Solis MRN: [id number] DOB: Dec 23, 1945 Today's Date: 07/15/2015 Time: 1115-1140 SLP Time Calculation (min) (ACUTE ONLY): 25 min  Assessment / Plan / Recommendation Clinical Impression  Pt sitting in recliner.  Demonstrates improved fluency of verbalizations today (>9 words/utterance)- breaks in dysfluency occur with increased novelty of utterance, requiring min cues to slow rate.  Continued impaired repetition of low frequency sentences.  Min cues for description to enhance naming to confrontation.  Pt with improved airway protection when swallowing trial thin liquids.  Mod cues to attend to anterior spillage.  Recommend continued dysphagia 3, advance to thin liquids.  SLP to follow for swallow/aphasia.     HPI HPI: 70 y.o. male with history of paroxysmal atrial fibrillation and congestive heart failure with ejection fraction 35-40% presented 3/3 with right hemiparesis and speech difficulties; received IV TPA. 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 S/P bilateral common carotid arteriogram,followed by endovascular revascularization of occluded Lt MCA M1 seg.  Intubated 3/3-3/5.       SLP Plan  Continue with current plan of care     Recommendations  Diet recommendations: Dysphagia 3 (mechanical soft);Thin liquid Liquids provided via: Cup Medication Administration: Whole meds with puree Supervision: Patient able to self feed Compensations: Monitor for anterior loss Postural Changes and/or Swallow Maneuvers: Seated upright 90 degrees             Oral Care Recommendations: Oral care BID Plan: Continue with current plan of care                    Chamaine Stankus L. Tivis Ringer, [place] CCC/SLP Pager [phone]  Juan Quam Laurice 07/15/2015, 11:41 AM

## 2015-07-15 NOTE — Progress Notes (Signed)
PULMONARY  / CRITICAL CARE MEDICINE CONSULTATION   Name: Phillip Solis MRN: [id number] DOB: [birth year]    ADMISSION DATE:  07/11/2015 CONSULTATION DATE: July 15, 2015  REQUESTING CLINICIAN: Garvin Fila, MD PRIMARY SERVICE: [facility]  CHIEF COMPLAINT:  Altered Mental Status  BRIEF PATIENT DESCRIPTION: 70 y/o man with hx of CHF admitted 3/3 with ischemic stroke now s/p IR clot retrieval.  SIGNIFICANT EVENTS / STUDIES:  3/3>>> IR directed mechanical clot retrieval 3/5 CT head>>>  1. Evolving patchy acute left MCA territory ischemic infarcts as above. No significant mass effect or evidence of hemorrhagic transformation.  2. No other new acute intracranial process.  3. 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 3/5 Af-RVR - cards consulted  LINES / TUBES: ETT 3/3>>>3/5  CULTURES: None  ANTIBIOTICS: None   SUBJECTIVE:   Off neo gtt afebrile  VITAL SIGNS: Temp:  [97.3 F (36.3 C)-98.6 F (37 C)] 97.9 F (36.6 C) (03/07 0800) Pulse Rate:  [80-141] 99 (03/07 0700) Resp:  [16-31] 22 (03/07 0700) BP: (92-128)/(61-96) 126/76 mmHg (03/07 0700) SpO2:  [91 %-98 %] 93 % (03/07 0700) Weight:  [75.3 kg (166 lb 0.1 oz)] 75.3 kg (166 lb 0.1 oz) (03/07 0500) HEMODYNAMICS:   VENTILATOR SETTINGS:   INTAKE / OUTPUT: Intake/Output      03/06 0701 - 03/07 0700 03/07 0701 - 03/08 0700   I.V. (mL/kg) 260 (3.5)    NG/GT     IV Piggyback     Total Intake(mL/kg) 260 (3.5)    Urine (mL/kg/hr) 550 (0.3)    Stool 0 (0)    Total Output 550     Net -290          Stool Occurrence 1 x      PHYSICAL EXAMINATION: General: acutely ill  Neuro:  Alert, follows simple commands , weak on rt 4/5 HEENT:  MMM,  Neck: No LAD Cardiovascular:  Heart sounds dual and normal. Regular rate. Lungs: resps even non labored, few scattered rhonchi Abdomen:  Soft Musculoskeletal:  No swollen joints Skin:  No rashes.  LABS:  CBC  Recent Labs Lab 07/13/15 0826 07/14/15 0409  07/15/15 0310  WBC 7.9 7.3 5.5  HGB 11.7* 10.8* 10.0*  HCT 36.4* 33.4* 31.4*  PLT 160 142* 137*   Coag's  Recent Labs Lab 07/11/15 2152  APTT 25  INR 0.96   BMET  Recent Labs Lab 07/13/15 0826 07/14/15 0409 07/15/15 0310  NA 139 145 144  K 3.7 3.8 3.9  CL 104 115* 111  CO2 '25 24 24  '$ BUN '12 8 9  '$ CREATININE 0.92 0.88 0.83  GLUCOSE 101* 107* 87   Electrolytes  Recent Labs Lab 07/13/15 0826 07/14/15 0409 07/15/15 0310  CALCIUM 7.8* 7.9* 8.2*   Sepsis Markers No results for input(s): LATICACIDVEN, PROCALCITON, O2SATVEN in the last 168 hours. ABG  Recent Labs Lab 07/12/15 0425  PHART 7.427  PCO2ART 28.8*  PO2ART 175*   Liver Enzymes  Recent Labs Lab 07/11/15 2152 07/13/15 0826  AST 22 39  ALT 14* 19  ALKPHOS 62 57  BILITOT 0.4 1.0  ALBUMIN 3.3* 2.7*   Cardiac Enzymes No results for input(s): TROPONINI, PROBNP in the last 168 hours. Glucose  Recent Labs Lab 07/14/15 0729 07/14/15 1127 07/14/15 2028 07/14/15 2307 07/15/15 0341 07/15/15 0756  GLUCAP 100* 99 142* 107* 91 85    Imaging Dg Chest 1 View  07/13/2015  CLINICAL DATA:  Pulmonary edema, history atrial fibrillation, palpitations, former smoker, stroke, dilated  non ischemic cardiomyopathy EXAM: CHEST 1 VIEW COMPARISON:  Portable exam 0929 hours compared to 07/12/2015 FINDINGS: Tip of endotracheal tube projects 4.4 cm above carina. Nasogastric tube extends into stomach. Normal heart size, mediastinal contours, and pulmonary vascularity. Minimal RIGHT basilar atelectasis. Lungs otherwise clear. No pleural effusion or pneumothorax. Question RIGHT nipple shadow not seen prior chest radiograph. IMPRESSION: Minimal RIGHT basilar atelectasis. Electronically Signed   By: Lavonia Dana M.D.   On: 07/13/2015 09:47   Mr Brain Wo Contrast  07/13/2015  CLINICAL DATA:  Right-sided weakness and difficulty speaking EXAM: MRI HEAD WITHOUT CONTRAST MRA HEAD WITHOUT CONTRAST TECHNIQUE: Multiplanar, multiecho  pulse sequences of the brain and surrounding structures were obtained without intravenous contrast. Angiographic images of the head were obtained using MRA technique without contrast. COMPARISON:  Multiple recent head CT FINDINGS: Motion degraded study which could obscure pathology. MRI HEAD FINDINGS Calvarium and upper cervical spine: No focal marrow signal abnormality. Orbits: Negative. Sinuses and Mastoids: Chronic sinusitis with left predominant moderate mucosal thickening. Bilateral mastoid fluid or mucosal thickening with clear nasopharynx. Brain: Restricted diffusion which is most confluent in the left MCA territory inferior division, including the posterior insula, posterior putamen, patchy deep white matter tracts, and lateral left temporal lobe. There is patchy infarcts along the high left frontal and low left parietal lobes. Well-developed cytotoxic edema without midline shift or herniation. No suspected hemorrhagic conversion; susceptibility artifact at the right splenium of the corpus callosum and at the base of the left temporal parietal infarct is attributed to developmental venous anomalies based on prior head CTs. No hydrocephalus. No evidence of major vessel occlusion. MRA HEAD FINDINGS Severely limited due to motion, with doubling or distortion of all vessels. This could easily obscure stenosis after occlusion. An aneurysm would likely be missed. Fetal type right PCA.  No other notable anatomic variant. There is symmetric signal within the bilateral MCA branches with no residual narrowing seen at the site of recent left M1 occlusion. IMPRESSION: 1. Acute infarct of roughly 1/3 left MCA territory, most confluent in the inferior territory. No suspected hemorrhagic conversion. 2. Motion degraded study which particularly affects the MRA. The re- opened left M1 segment remains patent. No evidence of proximal stenosis or occlusion. 3. 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 Electronically Signed   By: Monte Fantasia M.D.   On: 07/13/2015 19:08   Dg Chest Port 1 View  07/14/2015  CLINICAL DATA:  Respiratory failure. EXAM: PORTABLE CHEST 1 VIEW COMPARISON:  07/13/2015, 08/24/202016, 01/11/2010. FINDINGS: Interim extubation and removal of NG tube. Mild mediastinal prominence, this may be related AP technique and patient rotation. Follow-up PA and lateral chest x-ray suggested. Cardiomegaly. No pulmonary venous congestion. Low lung volumes with mild bibasilar atelectasis and/or infiltrates. Small left pleural effusion. No pneumothorax. IMPRESSION: 1. Mild mediastinal prominence. This may be related AP technique and patient rotation. Follow-up PA and lateral chest x-ray suggested to further evaluate. 2. Low lung volumes. Mild bibasilar atelectasis and/or infiltrates. Small left pleural effusion. 3. Stable mild cardiomegaly.  No pulmonary venous congestion . Electronically Signed   By: Marcello Moores  Register   On: 07/14/2015 07:28   Mr Jodene Nam Head/brain Wo Cm  07/13/2015  CLINICAL DATA:  Right-sided weakness and difficulty speaking EXAM: MRI HEAD WITHOUT CONTRAST MRA HEAD WITHOUT CONTRAST TECHNIQUE: Multiplanar, multiecho pulse sequences of the brain and surrounding structures were obtained without intravenous contrast. Angiographic images of the head were obtained using MRA technique without contrast. COMPARISON:  Multiple  recent head CT FINDINGS: Motion degraded study which could obscure pathology. MRI HEAD FINDINGS Calvarium and upper cervical spine: No focal marrow signal abnormality. Orbits: Negative. Sinuses and Mastoids: Chronic sinusitis with left predominant moderate mucosal thickening. Bilateral mastoid fluid or mucosal thickening with clear nasopharynx. Brain: Restricted diffusion which is most confluent in the left MCA territory inferior division, including the posterior insula, posterior putamen, patchy deep white matter tracts, and lateral left temporal lobe.  There is patchy infarcts along the high left frontal and low left parietal lobes. Well-developed cytotoxic edema without midline shift or herniation. No suspected hemorrhagic conversion; susceptibility artifact at the right splenium of the corpus callosum and at the base of the left temporal parietal infarct is attributed to developmental venous anomalies based on prior head CTs. No hydrocephalus. No evidence of major vessel occlusion. MRA HEAD FINDINGS Severely limited due to motion, with doubling or distortion of all vessels. This could easily obscure stenosis after occlusion. An aneurysm would likely be missed. Fetal type right PCA.  No other notable anatomic variant. There is symmetric signal within the bilateral MCA branches with no residual narrowing seen at the site of recent left M1 occlusion. IMPRESSION: 1. Acute infarct of roughly 1/3 left MCA territory, most confluent in the inferior territory. No suspected hemorrhagic conversion. 2. Motion degraded study which particularly affects the MRA. The re- opened left M1 segment remains patent. No evidence of proximal stenosis or occlusion. 3. 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 Electronically Signed   By: Monte Fantasia M.D.   On: 07/13/2015 19:08    ASSESSMENT / PLAN:  NEUROLOGIC A: Acute lt MCA Ischemic Stroke s/p tPA and catheter-directed clot retrieval. P:   Per neuro    PULMONARY A:Post op resp failure - resolved P:    pulm hygiene   CARDIOVASCULAR A: Hx CHF - EF35% Af-RVR P:   Off  neo gtt  Digoxin per cards eliquis per neuro  Dys 3 diet/ PT consult Can transfer to tele PCCM available  Kara Mead MD. [facility]. [facility] Pager [phone] If no response call [phone]    07/15/2015  8:13 AM

## 2015-07-16 ENCOUNTER — Inpatient Hospital Stay (HOSPITAL_COMMUNITY): Payer: Medicare HMO

## 2015-07-16 DIAGNOSIS — J984 Other disorders of lung: Secondary | ICD-10-CM

## 2015-07-16 DIAGNOSIS — T462X5A Adverse effect of other antidysrhythmic drugs, initial encounter: Secondary | ICD-10-CM

## 2015-07-16 LAB — PULMONARY FUNCTION TEST
DL/VA % PRED: 90 %
DL/VA: 4.26 ml/min/mmHg/L
DLCO UNC: 12.81 ml/min/mmHg
DLCO cor % pred: 43 %
DLCO cor: 15.24 ml/min/mmHg
DLCO unc % pred: 36 %
FEF 25-75 Pre: 1.56 L/sec
FEF2575-%Pred-Pre: 59 %
FEV1-%Pred-Pre: 45 %
FEV1-Pre: 1.61 L
FEV1FVC-%PRED-PRE: 110 %
FEV6-%Pred-Pre: 43 %
FEV6-Pre: 1.97 L
FEV6FVC-%PRED-PRE: 105 %
FVC-%PRED-PRE: 41 %
FVC-PRE: 1.97 L
Pre FEV1/FVC ratio: 82 %
Pre FEV6/FVC Ratio: 100 %

## 2015-07-16 NOTE — Clinical Social Work Note (Signed)
CSW spoke to patient he says he would rather go home instead of going to SNF for short term rehab.  CSW explained to patient the benefits of going to a SNF for short term rehab and patient expressed that he would still rather go home with home health.  CSW was given permission by patient to begin bed search process, even though he feel he would rather go home with home health.  CSW tried to call patient's wife and ask what she thought about patient going to SNF, however she was not available.  CSW left message on patient's wife's voice mail waiting for call back.  CSW to continue to follow patient's progress.  Jones Broom. [place], MSW, [place] 07/16/2015 5:51 PM

## 2015-07-16 NOTE — Discharge Instructions (Signed)

## 2015-07-16 NOTE — Progress Notes (Signed)
SUBJECTIVE:  No complaints  OBJECTIVE:   Vitals:   Filed Vitals:   07/15/15 2124 07/16/15 0113 07/16/15 0456 07/16/15 0944  BP: 135/87 138/86 125/83 129/70  Pulse: 92 87 88 86  Temp: 98.7 F (37.1 C) 98.2 F (36.8 C) 98.5 F (36.9 C) 98.3 F (36.8 C)  TempSrc: Oral Oral Oral Oral  Resp: '16 16 18 20  '$ Height:      Weight:      SpO2: 98% 98% 97% 98%   I&O's:   Intake/Output Summary (Last 24 hours) at 07/16/15 0945 Last data filed at 07/16/15 0748  Gross per 24 hour  Intake      0 ml  Output    675 ml  Net   -675 ml   TELEMETRY: Reviewed telemetry pt in NSR:     PHYSICAL EXAM General: Well developed, well nourished, in no acute distress Head: Eyes PERRLA, No xanthomas.   Normal cephalic and atramatic  Lungs:   Clear bilaterally to auscultation and percussion. Heart:   HRRR S1 S2 Pulses are 2+ & equal. Abdomen: Bowel sounds are positive, abdomen soft and non-tender without masses Extremities:   No clubbing, cyanosis or edema.  DP +1 Neuro: Alert and oriented X 3. Psych:  Good affect, responds appropriately   LABS: Basic Metabolic Panel:  Recent Labs  07/14/15 0409 07/15/15 0310  NA 145 144  K 3.8 3.9  CL 115* 111  CO2 24 24  GLUCOSE 107* 87  BUN 8 9  CREATININE 0.88 0.83  CALCIUM 7.9* 8.2*   Liver Function Tests: No results for input(s): AST, ALT, ALKPHOS, BILITOT, PROT, ALBUMIN in the last 72 hours. No results for input(s): LIPASE, AMYLASE in the last 72 hours. CBC:  Recent Labs  07/14/15 0409 07/15/15 0310  WBC 7.3 5.5  HGB 10.8* 10.0*  HCT 33.4* 31.4*  MCV 91.5 91.3  PLT 142* 137*   Cardiac Enzymes: No results for input(s): CKTOTAL, CKMB, CKMBINDEX, TROPONINI in the last 72 hours. BNP: Invalid input(s): POCBNP D-Dimer: No results for input(s): DDIMER in the last 72 hours. Hemoglobin A1C: No results for input(s): HGBA1C in the last 72 hours. Fasting Lipid Panel: No results for input(s): CHOL, HDL, LDLCALC, TRIG, CHOLHDL, LDLDIRECT in  the last 72 hours. Thyroid Function Tests:  Recent Labs  07/15/15 1020  TSH 0.889   Anemia Panel: No results for input(s): VITAMINB12, FOLATE, FERRITIN, TIBC, IRON, RETICCTPCT in the last 72 hours. Coag Panel:   Lab Results  Component Value Date   INR 0.96 07/11/2015   INR 0.99 07/15/2014    RADIOLOGY: Ct Angio Head W/cm &/or Wo Cm  07/11/2015  CLINICAL DATA:  Code stroke, RIGHT facial droop. Slurred speech and uneven pupils. History of atrial fibrillation. EXAM: CT ANGIOGRAPHY HEAD TECHNIQUE: Multidetector CT imaging of the head was performed using the standard protocol during bolus administration of intravenous contrast. Multiplanar CT image reconstructions and MIPs were obtained to evaluate the vascular anatomy. CONTRAST:  76m OMNIPAQUE IOHEXOL 350 MG/ML SOLN COMPARISON:  CT head July 11, 2015 at 2205 hours FINDINGS: CT HEAD Mildly dense LEFT MCA with LEFT insular ribbon sign. No intraparenchymal hemorrhage, mass effect, midline shift. Ventricles and sulci are overall normal for patient's age, cavum septum pellucidum is a normal variant. Subcentimeter density RIGHT parasagittal parietal lobe suggests developmental venous anomaly. No abnormal extra-axial fluid collections. Basal cisterns are patent. Ocular globes and orbital contents are normal. Paranasal sinuses and mastoid air cells are well aerated. No skull fracture. Multiple prominent vascular  lakes at the convexity. Patient is edentulous. CTA NECK Aortic arch: Normal appearance of the thoracic arch, normal branch pattern. Mild calcific atherosclerosis of the aortic arch. The origins of the innominate, left Common carotid artery and subclavian artery are widely patent. Right carotid system: Common carotid artery is widely patent, coursing in a straight line fashion. Normal appearance of the carotid bifurcation without hemodynamically significant stenosis by NASCET criteria. Normal appearance of the included internal carotid artery. Left  carotid system: Common carotid artery is widely patent, coursing in a straight line fashion. Normal appearance of the carotid bifurcation without hemodynamically significant stenosis by NASCET criteria. Mild eccentric calcific atherosclerosis. Normal appearance of the included internal carotid artery. Vertebral arteries:Left vertebral artery is dominant. Normal appearance of the vertebral arteries, which appear widely patent. Skeleton: No acute osseous process though bone windows have not been submitted. Moderate to severe multilevel degenerative discs resulting in moderate to severe neural foraminal narrowing C3-4, C4-5. Old mild T2 compression fracture with scoliosis. No destructive bony lesions. Other neck: Soft tissues of the neck are nonacute though, not tailored for evaluation. CTA HEAD Anterior circulation: Normal appearance of the cervical internal carotid arteries, petrous, cavernous and supra clinoid internal carotid arteries. Widely patent anterior communicating artery. LEFT A1 is developmentally dominant. Acute distal LEFT M1 segment occlusion. Minimal collateral present on CTA. RIGHT middle cerebral artery vessels are patent. Posterior circulation: Normal appearance of the vertebral arteries, vertebrobasilar junction and basilar artery, as well as main branch vessels. Normal appearance of the posterior cerebral arteries. Fetal origin RIGHT posterior cerebral artery. No hemodynamically significant stenosis, dissection, luminal irregularity, contrast extravasation or aneurysm within the anterior nor posterior circulation. 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 CTA neck:  Negative. CTA head: Emergent distal LEFT M1 occlusion. 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 Acute findings discussed with and reconfirmed by Dr.MCNEILL [facility] on 07/11/2015 at 10:23 pm. Electronically Signed   By: Elon Alas M.D.   On: 07/11/2015 23:00   Dg Chest 1 View  07/13/2015  CLINICAL DATA:  Pulmonary edema, history atrial fibrillation, palpitations, former smoker, stroke, dilated non ischemic cardiomyopathy EXAM: CHEST 1 VIEW COMPARISON:  Portable exam 0929 hours compared to 07/12/2015 FINDINGS: Tip of endotracheal tube projects 4.4 cm above carina. Nasogastric tube extends into stomach. Normal heart size, mediastinal contours, and pulmonary vascularity. Minimal RIGHT basilar atelectasis. Lungs otherwise clear. No pleural effusion or pneumothorax. Question RIGHT nipple shadow not seen prior chest radiograph. IMPRESSION: Minimal RIGHT basilar atelectasis. Electronically Signed   By: Lavonia Dana M.D.   On: 07/13/2015 09:47   Ct Head Wo Contrast  07/13/2015  CLINICAL DATA:  Follow-up examination for stroke. Status post catheter directed revascularization with intra-arterial tPA. EXAM: CT HEAD WITHOUT CONTRAST TECHNIQUE: Contiguous axial images were obtained from the base of the skull through the vertex without intravenous contrast. COMPARISON:  Multiple previous studies from 07/11/2015 and 07/12/2015. FINDINGS: Age-related cerebral atrophy with chronic small vessel ischemic type changes again noted. There is evolving patchy hypodensity within the left MCA territory involving the left insular region with extension into the left lentiform nucleus, compatible with evolving left MCA territory infarct focal hypodensity within the left temporal region consistent with evolving ischemia. Probable additional patchy hypodensity with subtle loss of gray-white matter differentiation more superficially and posteriorly within the posterior left frontal lobe. No significant mass effect or evidence for hemorrhagic transformation. Previously seen dense left M1 segment no longer visualized.  No other acute or evolving large vessel territory infarct. No intracranial hemorrhage. No mass lesion, midline shift, or mass effect. No  hydrocephalus. No extra-axial fluid collection. Scalp soft tissues within normal limits. No acute abnormality about the orbits. Scattered mucosal thickening within the paranasal sinuses. Mastoid air cells grossly clear. Middle ear cavities clear. Calvarium intact. IMPRESSION: 1. Evolving patchy acute left MCA territory ischemic infarcts as above. No significant mass effect or evidence of hemorrhagic transformation. 2. No other new acute intracranial process. 3. 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 Electronically Signed   By: Jeannine Boga M.D.   On: 07/13/2015 01:53   Ct Head Wo Contrast  07/12/2015  CLINICAL DATA:  Follow-up LEFT middle cerebral artery stroke, status post LEFT middle cerebral endovascular revascularization. EXAM: CT HEAD WITHOUT CONTRAST TECHNIQUE: Contiguous axial images were obtained from the base of the skull through the vertex without intravenous contrast. COMPARISON:  CT head July 11, 2015 FINDINGS: Faint contrast staining of LEFT posterior temporal lobe, improved visualization of insula gray-white matter junction. Postcontrast imaging without evidence of hemorrhage, mass effect or midline shift. Ventricles and sulci are normal for patient's age. Subcentimeter enhancement RIGHT parasagittal parietal lobe compatible with developmental venous anomaly. No abnormal extra-axial fluid collections. Minimal calcific atherosclerosis the carotid siphons. Old RIGHT medial orbital blowout fracture. Mild paranasal sinus mucosal thickening, life-support lines in place. Mastoid air cells are well aerated. Prominent vascular lakes at the vertex. IMPRESSION: Faint contrast staining LEFT posterior temporal lobe, with improved visualization gray-white matter junction, no hemorrhage. Electronically Signed   By: Elon Alas M.D.   On: 07/12/2015 02:05   Ct Head Wo Contrast  07/11/2015  CLINICAL DATA:  Code stroke. Right-sided facial droop and slurred speech. Uneven pupils.  Initial encounter. EXAM: CT HEAD WITHOUT CONTRAST TECHNIQUE: Contiguous axial images were obtained from the base of the skull through the vertex without intravenous contrast. COMPARISON:  CT of the head performed 2020/10/214 FINDINGS: There is no evidence of acute infarction, mass lesion, or intra- or extra-axial hemorrhage on CT. Prominence of the ventricles and sulci suggests mild cortical volume loss. Scattered subcortical and periventricular white matter change likely reflects small vessel ischemic microangiopathy, relatively stable from the prior study. Mild cerebellar atrophy is noted. The brainstem and fourth ventricle are within normal limits. The basal ganglia are unremarkable in appearance. The cerebral hemispheres demonstrate grossly normal gray-white differentiation. No mass effect or midline shift is seen. There is no evidence of fracture; chronic scattered calvarial lucencies are again noted at the vertex, relatively stable in appearance. The orbits are within normal limits. The paranasal sinuses and mastoid air cells are well-aerated. No significant soft tissue abnormalities are seen. IMPRESSION: 1. No acute intracranial pathology seen on CT. 2. Mild cortical volume loss and scattered small vessel ischemic microangiopathy. These results were called by telephone at the time of interpretation on 07/11/2015 at 10:18 pm to Dr. Leonel Ramsay, who verbally acknowledged these results. Electronically Signed   By: Garald Balding M.D.   On: 07/11/2015 22:21   Ct Angio Neck W/cm &/or Wo/cm  07/11/2015  CLINICAL DATA:  Code stroke, RIGHT facial droop. Slurred speech and uneven pupils. History of atrial fibrillation. EXAM: CT ANGIOGRAPHY HEAD TECHNIQUE: Multidetector CT imaging of the head was performed using the standard protocol during bolus administration of intravenous contrast. Multiplanar CT image reconstructions and MIPs were obtained to evaluate the vascular anatomy. CONTRAST:  92m OMNIPAQUE IOHEXOL 350  MG/ML SOLN COMPARISON:  CT head July 11, 2015 at 2205 hours  FINDINGS: CT HEAD Mildly dense LEFT MCA with LEFT insular ribbon sign. No intraparenchymal hemorrhage, mass effect, midline shift. Ventricles and sulci are overall normal for patient's age, cavum septum pellucidum is a normal variant. Subcentimeter density RIGHT parasagittal parietal lobe suggests developmental venous anomaly. No abnormal extra-axial fluid collections. Basal cisterns are patent. Ocular globes and orbital contents are normal. Paranasal sinuses and mastoid air cells are well aerated. No skull fracture. Multiple prominent vascular lakes at the convexity. Patient is edentulous. CTA NECK Aortic arch: Normal appearance of the thoracic arch, normal branch pattern. Mild calcific atherosclerosis of the aortic arch. The origins of the innominate, left Common carotid artery and subclavian artery are widely patent. Right carotid system: Common carotid artery is widely patent, coursing in a straight line fashion. Normal appearance of the carotid bifurcation without hemodynamically significant stenosis by NASCET criteria. Normal appearance of the included internal carotid artery. Left carotid system: Common carotid artery is widely patent, coursing in a straight line fashion. Normal appearance of the carotid bifurcation without hemodynamically significant stenosis by NASCET criteria. Mild eccentric calcific atherosclerosis. Normal appearance of the included internal carotid artery. Vertebral arteries:Left vertebral artery is dominant. Normal appearance of the vertebral arteries, which appear widely patent. Skeleton: No acute osseous process though bone windows have not been submitted. Moderate to severe multilevel degenerative discs resulting in moderate to severe neural foraminal narrowing C3-4, C4-5. Old mild T2 compression fracture with scoliosis. No destructive bony lesions. Other neck: Soft tissues of the neck are nonacute though, not tailored for  evaluation. CTA HEAD Anterior circulation: Normal appearance of the cervical internal carotid arteries, petrous, cavernous and supra clinoid internal carotid arteries. Widely patent anterior communicating artery. LEFT A1 is developmentally dominant. Acute distal LEFT M1 segment occlusion. Minimal collateral present on CTA. RIGHT middle cerebral artery vessels are patent. Posterior circulation: Normal appearance of the vertebral arteries, vertebrobasilar junction and basilar artery, as well as main branch vessels. Normal appearance of the posterior cerebral arteries. Fetal origin RIGHT posterior cerebral artery. No hemodynamically significant stenosis, dissection, luminal irregularity, contrast extravasation or aneurysm within the anterior nor posterior circulation. 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 CTA neck:  Negative. CTA head: Emergent distal LEFT M1 occlusion. 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 Acute findings discussed with and reconfirmed by Dr.MCNEILL [facility] on 07/11/2015 at 10:23 pm. Electronically Signed   By: Elon Alas M.D.   On: 07/11/2015 23:00   Mr Brain Wo Contrast  07/13/2015  CLINICAL DATA:  Right-sided weakness and difficulty speaking EXAM: MRI HEAD WITHOUT CONTRAST MRA HEAD WITHOUT CONTRAST TECHNIQUE: Multiplanar, multiecho pulse sequences of the brain and surrounding structures were obtained without intravenous contrast. Angiographic images of the head were obtained using MRA technique without contrast. COMPARISON:  Multiple recent head CT FINDINGS: Motion degraded study which could obscure pathology. MRI HEAD FINDINGS Calvarium and upper cervical spine: No focal marrow signal abnormality. Orbits: Negative. Sinuses and Mastoids: Chronic sinusitis with left predominant moderate mucosal thickening. Bilateral mastoid fluid or mucosal thickening with clear nasopharynx.  Brain: Restricted diffusion which is most confluent in the left MCA territory inferior division, including the posterior insula, posterior putamen, patchy deep white matter tracts, and lateral left temporal lobe. There is patchy infarcts along the high left frontal and low left parietal lobes. Well-developed cytotoxic edema without midline shift or herniation. No suspected hemorrhagic conversion; susceptibility artifact at the right splenium of the corpus callosum and at the base  of the left temporal parietal infarct is attributed to developmental venous anomalies based on prior head CTs. No hydrocephalus. No evidence of major vessel occlusion. MRA HEAD FINDINGS Severely limited due to motion, with doubling or distortion of all vessels. This could easily obscure stenosis after occlusion. An aneurysm would likely be missed. Fetal type right PCA.  No other notable anatomic variant. There is symmetric signal within the bilateral MCA branches with no residual narrowing seen at the site of recent left M1 occlusion. IMPRESSION: 1. Acute infarct of roughly 1/3 left MCA territory, most confluent in the inferior territory. No suspected hemorrhagic conversion. 2. Motion degraded study which particularly affects the MRA. The re- opened left M1 segment remains patent. No evidence of proximal stenosis or occlusion. 3. 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 Electronically Signed   By: Monte Fantasia M.D.   On: 07/13/2015 19:08   Dg Chest Port 1 View  07/14/2015  CLINICAL DATA:  Respiratory failure. EXAM: PORTABLE CHEST 1 VIEW COMPARISON:  07/13/2015, August 30, 202016, 01/11/2010. FINDINGS: Interim extubation and removal of NG tube. Mild mediastinal prominence, this may be related AP technique and patient rotation. Follow-up PA and lateral chest x-ray suggested. Cardiomegaly. No pulmonary venous congestion. Low lung volumes with mild bibasilar atelectasis and/or infiltrates. Small left  pleural effusion. No pneumothorax. IMPRESSION: 1. Mild mediastinal prominence. This may be related AP technique and patient rotation. Follow-up PA and lateral chest x-ray suggested to further evaluate. 2. Low lung volumes. Mild bibasilar atelectasis and/or infiltrates. Small left pleural effusion. 3. Stable mild cardiomegaly.  No pulmonary venous congestion . Electronically Signed   By: Marcello Moores  Register   On: 07/14/2015 07:28   Portable Chest Xray  07/12/2015  CLINICAL DATA:  Endotracheal tube placement.  Initial encounter. EXAM: PORTABLE CHEST 1 VIEW COMPARISON:  Chest radiograph performed August 30, 202016 FINDINGS: The patient's endotracheal tube is seen ending 1-2 cm above the carina. This could be retracted 2 cm. Minimal bibasilar atelectasis is noted. Mild vascular congestion is noted. No pleural effusion or pneumothorax is seen. The cardiomediastinal silhouette is borderline normal in size. No acute osseous abnormalities are identified. IMPRESSION: 1. Endotracheal tube seen ending 1-2 cm above the carina. This could be retracted 2 cm. 2. Minimal bibasilar atelectasis noted. Mild vascular congestion seen. Electronically Signed   By: Garald Balding M.D.   On: 07/12/2015 02:51   Dg Abd Portable 1v  07/12/2015  CLINICAL DATA:  Orogastric tube placement.  Initial encounter. EXAM: PORTABLE ABDOMEN - 1 VIEW COMPARISON:  None. FINDINGS: The patient's enteric tube is noted ending overlying the body of the stomach. The side-port is noted about the fundus of the stomach. The visualized bowel gas pattern is unremarkable. Scattered air and stool filled loops of colon are seen; no abnormal dilatation of small bowel loops is seen to suggest small bowel obstruction. No free intra-abdominal air is identified, though evaluation for free air is limited on a single supine view. The visualized osseous structures are within normal limits; the sacroiliac joints are unremarkable in appearance. The visualized lung bases are essentially  clear. IMPRESSION: Enteric tube noted ending overlying the body of the stomach. Electronically Signed   By: Garald Balding M.D.   On: 07/12/2015 23:58   Mr Jodene Nam Head/brain Wo Cm  07/13/2015  CLINICAL DATA:  Right-sided weakness and difficulty speaking EXAM: MRI HEAD WITHOUT CONTRAST MRA HEAD WITHOUT CONTRAST TECHNIQUE: Multiplanar, multiecho pulse sequences of the brain and surrounding structures were obtained without intravenous contrast. Angiographic images  of the head were obtained using MRA technique without contrast. COMPARISON:  Multiple recent head CT FINDINGS: Motion degraded study which could obscure pathology. MRI HEAD FINDINGS Calvarium and upper cervical spine: No focal marrow signal abnormality. Orbits: Negative. Sinuses and Mastoids: Chronic sinusitis with left predominant moderate mucosal thickening. Bilateral mastoid fluid or mucosal thickening with clear nasopharynx. Brain: Restricted diffusion which is most confluent in the left MCA territory inferior division, including the posterior insula, posterior putamen, patchy deep white matter tracts, and lateral left temporal lobe. There is patchy infarcts along the high left frontal and low left parietal lobes. Well-developed cytotoxic edema without midline shift or herniation. No suspected hemorrhagic conversion; susceptibility artifact at the right splenium of the corpus callosum and at the base of the left temporal parietal infarct is attributed to developmental venous anomalies based on prior head CTs. No hydrocephalus. No evidence of major vessel occlusion. MRA HEAD FINDINGS Severely limited due to motion, with doubling or distortion of all vessels. This could easily obscure stenosis after occlusion. An aneurysm would likely be missed. Fetal type right PCA.  No other notable anatomic variant. There is symmetric signal within the bilateral MCA branches with no residual narrowing seen at the site of recent left M1 occlusion. IMPRESSION: 1. Acute  infarct of roughly 1/3 left MCA territory, most confluent in the inferior territory. No suspected hemorrhagic conversion. 2. Motion degraded study which particularly affects the MRA. The re- opened left M1 segment remains patent. No evidence of proximal stenosis or occlusion. 3. 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 Electronically Signed   By: Monte Fantasia M.D.   On: 07/13/2015 19:08   Ir Percutaneous Art Thrombectomy/infusion Intracranial Inc Diag Angio  07/15/2015  INDICATION: Acute onset of right-sided hemiplegia left-sided gaze deviation. Left MCA proximal occlusion by CT angiogram. EXAM: IR ANGIO INTRA EXTRACRAN SEL COM CAROTID INNOMINATE UNI RIGHT MOD SED. BILATERAL COMMON CAROTID ARTERIOGRAMS FOLLOWED BY ENDOVASCULAR COMPLETE REVASCULARIZATION OF OCCLUDED LEFT MIDDLE CEREBRAL ARTERY M1 SEGMENT USING MECHANICAL THROMBECTOMY AND SUPERSELECTIVE INTRACRANIAL INTRA-ARTERIAL 5 MG OF TPA: COMPARISON:  CT angiogram of the 07/11/2015. MEDICATIONS: 2 g Ancef. The antibiotic was administered within 1 hour of the procedure. ANESTHESIA/SEDATION: General anesthesia as per Department of Anesthesiology at [facility]. The patient was continuously monitored during the procedure by the CRNA. CONTRAST:  Omnipaque 300 approximately 70 cc. FLUOROSCOPY TIME:  Fluoroscopy Time: 46 minutes 54 seconds (1644 mGy). COMPLICATIONS: None immediate. TECHNIQUE: Informed written consent was obtained from the patient after a thorough discussion of the procedural risks, benefits and alternatives. All questions were addressed. Maximal Sterile Barrier Technique was utilized including caps, mask, sterile gowns, sterile gloves, sterile drape, hand hygiene and skin antiseptic. A timeout was performed prior to the initiation of the procedure. Following a full explanation of the procedure along with the potential complications, informed consent was obtained from the patient's wife. Risks  of intracranial hemorrhage of 10-15%, worsening neurological deficit, ventilator dependency, death and also inability to revascularize were all discussed in detail. Patient was then put under general anesthesia by the Department of Anesthesiology [facility]. The right groin was prepped and draped in the usual sterile fashion. Thereafter using modified Seldinger technique, transfemoral access into the right common femoral artery was obtained without difficulty. Over a 0.035 inch guidewire, a 5 French Pinnacle sheath was inserted. Through this, and also over a 0.035 inch guidewire a 5 [place] JB 1 catheter was advanced to the aortic arch region and selectively positioned  in the left common carotid artery, and at the end of the procedure, the right common carotid artery. Arteriograms were then performed centered intracranially and extra cranially. FINDINGS: The left common carotid arteriogram demonstrates the left external carotid artery and its major branches to be normally opacified. The left internal carotid artery at the bulb to the cranial skull base appears widely patent. A mild focal stenosis of the junction of the middle 1/3 and the proximal 1/3 of the left internal carotid artery was noted and to be nonflow limiting. The petrous, the cavernous and the supraclinoid segments opacified normally. The left middle cerebral artery was seen to be completely occluded in the mid M1 segment. The left anterior cerebral artery was seen to opacify into the capillary and the venous phases with shift of the watershed perfusion area more posteriorly into the parietal region. Spontaneous opacification via the anterior communicating artery of the right anterior cerebral artery A2 segment was also seen. ENDOVASCULAR COMPLETE REVASCULARIZATION OF THE OCCLUDED LEFT M1 SEGMENT USING MECHANICAL THROMBECTOMY AND 5 MG OF SUPERSELECTIVE INTRACRANIAL INTRA-ARTERIAL TPA: The diagnostic JB 1 catheter in the left common carotid  artery was exchanged over a 0.035 inch 300 cm Rosen exchange guidewire for a 55 cm 8 [place] Britetip neurovascular sheath using biplane roadmap technique and constant fluoroscopic guidance. Good aspiration was obtained from the side port of the neurovascular sheath. A gentle contrast injection demonstrated no evidence of spasms, dissections or of intraluminal filling defects. This was then connected to continuous heparinized saline infusion. Over the [employer] guidewire, an 8 [place], 85 cm Flowgate balloon guide catheter which had been prepped and purged with 50% contrast and 50% heparinized saline infusion was then advanced and positioned just proximal to the left common carotid artery bifurcation. The guidewire was removed. Good aspiration was obtained from the hub of the 8 [place] Flowgate guide catheter. A gentle contrast injection demonstrated no evidence of spasms, dissections or of intraluminal filling defects. Over a 0.035 inch Roadrunner guidewire, using biplane roadmap technique and constant fluoroscopic guidance, the 8 [place] Flowgate guide catheter was then advanced into the junction of the middle and the distal one-third of the left internal carotid artery. The guidewire was removed. Although good aspiration was obtained at the hub of the 8 [place] Flowgate guide catheter, a gentle contrast injection revealed contrast distally. The 8 [place] Flowgate guide catheter was then gently retrieved more proximally until there was free antegrade flow. This also unmasked a tight focal severe spasm which resolved with 25 mics of intra-arterial nitroglycerin given through the guide catheter and three separate aliquots. At this time, in a coaxial manner and with constant heparinized saline infusion using biplane roadmap technique and constant fluoroscopic guidance, a combination of a 115 cm 5 [place] Catalyst guide catheter with an 021 Trevo-ProVue microcatheter was then advanced over a 0.014 inch Softip Synchro  micro guidewire to the distal end of the 8 [place] Flowgate guide catheter. With the micro guidewire leading with a J-tip configuration, to avoid dissections or inducing spasm, the combination was then navigated to the supraclinoid left ICA. Thereafter using a torque device to manipulate, the occluded left M1 segment was then entered with the micro guidewire and advanced to the M2-M3 region of the inferior division followed by the microcatheter. The guidewire was removed. Good aspiration was obtained from the hub of the microcatheter. A gentle contrast injection demonstrated slow albeit antegrade flow distally. At this time, 5 mg of superselective intracranial intra-arterial tPA was infused through the 5  [place] Catalyst guide catheter, the tip of which was now in the supraclinoid left ICA. Also whilst the tPA was infusing, a 4 x 40 mm solitaire FR stent retrieval device which had been prepped and purged with heparinized saline infusion in its housing was then advanced to the distal end of the microcatheter. The O rings on the delivery microcatheter and delivery guidewire on the retrieval device were then loosened. With slight forward gentle traction with the right hand on the delivery microcatheter of the retrieval device, with the left hand, the delivery microcatheter was then retrieved and unsheathed to where the tip of the microcatheter was just proximal to the proximal portion of the retrieval device. This was then left deployed for approximately 3 minutes. At the end of this, a control arteriogram performed through the 5 [place] Navien guide catheter demonstrated partial revascularization of the left middle cerebral artery inferior division with the superior division remaining occluded. Multiple filling defects were seen throughout the intraluminal surface of the retrieval device. At this time, flow arrest was obtained by inflating the balloon of the Flowgate guide catheter. The proximal portion of the retrieval  device was then captured into the microcatheter. Using a 60 mL syringe at the hub of the 8 [place] Flowgate guide catheter for continuous aspiration, the combination of the retrieval device, the microcatheter and the 5 [place] Catalyst guide catheter were then gently retrieved and removed as aspiration was continued. Aspiration was also continued as the flow arrest was stopped by deflating the balloon. The aspirate did not contain any large clots. However bits of clot were seen in the interstices of the retrieval device. A control arteriogram performed through the 8 [place] Flowgate guide catheter demonstrated severe spasm of the mid to distal left internal carotid artery. There continued to be complete occlusion of the superior division of the left middle cerebral artery, and also filling defects noted in the trifurcation region. This prompted a second pass with the retrieval device. To this affect, the microcatheter and the Catalyst guide catheter combination was then advanced as described above using biplane roadmap technique and constant fluoroscopic guidance over a 0.014 inch Softip Synchro micro guidewire. Again access was obtained into the M2-M3 region of the inferior division of the left middle cerebral artery. The guidewire was removed. Again after having confirmed safe positioning of the distal microcatheter, a 4 x 40 mm Solitaire FR stent retrieval device was then advanced and positioned covering the entirety of the abnormal filling defects in the occluded superior division. With flow arrest established in the proximal left internal carotid artery by inflating the balloon, the combination of the retrieval device, the microcatheter and the 5 [place] Catalyst guide catheter was then performed gently and slowly as aspiration with a 60 mL syringe was continued. The aspirate was removed. Chunks of clot were seen within the aspirate. Also seen were little bits of clot in the interstices of the retrieval device.  Aspiration was continued whilst the balloon in the proximal left internal carotid artery was deflated. A control arteriogram performed through the 8 [place] Flowgate guide catheter in the left internal carotid artery demonstrated complete angiographic revascularization of the left middle cerebral artery distribution. There was no change in the anterior cerebral artery on the left. Also seen were at least 1 or 2 very small twigs of the anterior perisylvian branches being cut off with a small focal area of hypoperfusion. Also demonstrated at this time was anterior opacification involving the proximal caval cavernous segment of the  left internal carotid artery with a slow delayed opacification of the left cavernous sinus and subsequently the right cavernous sinus. 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 Arteriograms were then performed at 15 and 30 minutes which continued to demonstrate no change in this fistulous communication. Intracranially no change was noted. No angiographic filling defects or extravasations were seen. No mass-effect was seen either. Throughout the procedure the patient's neurological status and blood pressure remained stable. The 8 [place] Flowgate guide catheter was then retrieved and removed. At this time a 5 [place] JB 1 catheter was then advanced through the 8 [place] neurovascular sheath into the right common carotid artery. An arteriogram performed demonstrated the right external carotid artery and its major branches to be widely patent. The right internal carotid artery at the bulb to the cranial skull base opacified normally. The petrous, cavernous and the supraclinoid segments were seen to opacify normally. A right posterior communicating artery was seen to opacify the right posterior cerebral artery distribution. The right middle cerebral artery was seen to opacify normally into the capillary and the venous phases. A hypoplastic left anterior  cerebral artery was also seen to opacify to its tertiary branches and normally into the capillary and the venous phases. An 8 French neurovascular sheath was then exchanged over a J-tip guidewire for a 9 [place] Pinnacle sheath. This was then connected to continuous heparinized saline infusion. The patient was then transported to the CT scanner for postprocedural CT scan of brain. IMPRESSION: Status post endovascular complete revascularization of the occluded left middle cerebral artery using two passes with the solitaire FR stent retrieval device 40 x 4 mm, and the infusion of 5 mg of superselective intracranial intra-arterial tPA with achievement of a TICI 2b reperfusion. Small hairline twigs involving the anterior perisylvian region may represent chronic ischemia as was seen on the initial CT scan of the brain. Electronically Signed   By: [name] M.D.   On: 07/14/2015 12:51   Ir Angio Intra Extracran Sel Com Carotid Innominate Uni R Mod Sed  07/15/2015  INDICATION: Acute onset of right-sided hemiplegia left-sided gaze deviation. Left MCA proximal occlusion by CT angiogram. EXAM: IR ANGIO INTRA EXTRACRAN SEL COM CAROTID INNOMINATE UNI RIGHT MOD SED. BILATERAL COMMON CAROTID ARTERIOGRAMS FOLLOWED BY ENDOVASCULAR COMPLETE REVASCULARIZATION OF OCCLUDED LEFT MIDDLE CEREBRAL ARTERY M1 SEGMENT USING MECHANICAL THROMBECTOMY AND SUPERSELECTIVE INTRACRANIAL INTRA-ARTERIAL 5 MG OF TPA: COMPARISON:  CT angiogram of the 07/11/2015. MEDICATIONS: 2 g Ancef. The antibiotic was administered within 1 hour of the procedure. ANESTHESIA/SEDATION: General anesthesia as per Department of Anesthesiology at [facility]. The patient was continuously monitored during the procedure by the CRNA. CONTRAST:  Omnipaque 300 approximately 70 cc. FLUOROSCOPY TIME:  Fluoroscopy Time: 46 minutes 54 seconds (1644 mGy). COMPLICATIONS: None immediate. TECHNIQUE: Informed written consent was obtained from the patient after a thorough  discussion of the procedural risks, benefits and alternatives. All questions were addressed. Maximal Sterile Barrier Technique was utilized including caps, mask, sterile gowns, sterile gloves, sterile drape, hand hygiene and skin antiseptic. A timeout was performed prior to the initiation of the procedure. Following a full explanation of the procedure along with the potential complications, informed consent was obtained from the patient's wife. Risks of intracranial hemorrhage of 10-15%, worsening neurological deficit, ventilator dependency, death and also inability to revascularize were all discussed in detail. Patient was then put under general anesthesia by the Department of Anesthesiology [facility]. The right groin was prepped  and draped in the usual sterile fashion. Thereafter using modified Seldinger technique, transfemoral access into the right common femoral artery was obtained without difficulty. Over a 0.035 inch guidewire, a 5 [place] Pinnacle sheath was inserted. Through this, and also over a 0.035 inch guidewire a 5 [place] JB 1 catheter was advanced to the aortic arch region and selectively positioned in the left common carotid artery, and at the end of the procedure, the right common carotid artery. Arteriograms were then performed centered intracranially and extra cranially. FINDINGS: The left common carotid arteriogram demonstrates the left external carotid artery and its major branches to be normally opacified. The left internal carotid artery at the bulb to the cranial skull base appears widely patent. A mild focal stenosis of the junction of the middle 1/3 and the proximal 1/3 of the left internal carotid artery was noted and to be nonflow limiting. The petrous, the cavernous and the supraclinoid segments opacified normally. The left middle cerebral artery was seen to be completely occluded in the mid M1 segment. The left anterior cerebral artery was seen to opacify into the capillary and  the venous phases with shift of the watershed perfusion area more posteriorly into the parietal region. Spontaneous opacification via the anterior communicating artery of the right anterior cerebral artery A2 segment was also seen. ENDOVASCULAR COMPLETE REVASCULARIZATION OF THE OCCLUDED LEFT M1 SEGMENT USING MECHANICAL THROMBECTOMY AND 5 MG OF SUPERSELECTIVE INTRACRANIAL INTRA-ARTERIAL TPA: The diagnostic JB 1 catheter in the left common carotid artery was exchanged over a 0.035 inch 300 cm Rosen exchange guidewire for a 55 cm 8 [place] Britetip neurovascular sheath using biplane roadmap technique and constant fluoroscopic guidance. Good aspiration was obtained from the side port of the neurovascular sheath. A gentle contrast injection demonstrated no evidence of spasms, dissections or of intraluminal filling defects. This was then connected to continuous heparinized saline infusion. Over the [employer] guidewire, an 8 [place], 85 cm Flowgate balloon guide catheter which had been prepped and purged with 50% contrast and 50% heparinized saline infusion was then advanced and positioned just proximal to the left common carotid artery bifurcation. The guidewire was removed. Good aspiration was obtained from the hub of the 8 [place] Flowgate guide catheter. A gentle contrast injection demonstrated no evidence of spasms, dissections or of intraluminal filling defects. Over a 0.035 inch Roadrunner guidewire, using biplane roadmap technique and constant fluoroscopic guidance, the 8 [place] Flowgate guide catheter was then advanced into the junction of the middle and the distal one-third of the left internal carotid artery. The guidewire was removed. Although good aspiration was obtained at the hub of the 8 [place] Flowgate guide catheter, a gentle contrast injection revealed contrast distally. The 8 [place] Flowgate guide catheter was then gently retrieved more proximally until there was free antegrade flow. This also  unmasked a tight focal severe spasm which resolved with 25 mics of intra-arterial nitroglycerin given through the guide catheter and three separate aliquots. At this time, in a coaxial manner and with constant heparinized saline infusion using biplane roadmap technique and constant fluoroscopic guidance, a combination of a 115 cm 5 [place] Catalyst guide catheter with an 021 Trevo-ProVue microcatheter was then advanced over a 0.014 inch Softip Synchro micro guidewire to the distal end of the 8 [place] Flowgate guide catheter. With the micro guidewire leading with a J-tip configuration, to avoid dissections or inducing spasm, the combination was then navigated to the supraclinoid left ICA. Thereafter using a torque device to manipulate, the occluded left M1  segment was then entered with the micro guidewire and advanced to the M2-M3 region of the inferior division followed by the microcatheter. The guidewire was removed. Good aspiration was obtained from the hub of the microcatheter. A gentle contrast injection demonstrated slow albeit antegrade flow distally. At this time, 5 mg of superselective intracranial intra-arterial tPA was infused through the 5 [place] Catalyst guide catheter, the tip of which was now in the supraclinoid left ICA. Also whilst the tPA was infusing, a 4 x 40 mm solitaire FR stent retrieval device which had been prepped and purged with heparinized saline infusion in its housing was then advanced to the distal end of the microcatheter. The O rings on the delivery microcatheter and delivery guidewire on the retrieval device were then loosened. With slight forward gentle traction with the right hand on the delivery microcatheter of the retrieval device, with the left hand, the delivery microcatheter was then retrieved and unsheathed to where the tip of the microcatheter was just proximal to the proximal portion of the retrieval device. This was then left deployed for approximately 3 minutes. At the  end of this, a control arteriogram performed through the 5 [place] Navien guide catheter demonstrated partial revascularization of the left middle cerebral artery inferior division with the superior division remaining occluded. Multiple filling defects were seen throughout the intraluminal surface of the retrieval device. At this time, flow arrest was obtained by inflating the balloon of the Flowgate guide catheter. The proximal portion of the retrieval device was then captured into the microcatheter. Using a 60 mL syringe at the hub of the 8 [place] Flowgate guide catheter for continuous aspiration, the combination of the retrieval device, the microcatheter and the 5 [place] Catalyst guide catheter were then gently retrieved and removed as aspiration was continued. Aspiration was also continued as the flow arrest was stopped by deflating the balloon. The aspirate did not contain any large clots. However bits of clot were seen in the interstices of the retrieval device. A control arteriogram performed through the 8 [place] Flowgate guide catheter demonstrated severe spasm of the mid to distal left internal carotid artery. There continued to be complete occlusion of the superior division of the left middle cerebral artery, and also filling defects noted in the trifurcation region. This prompted a second pass with the retrieval device. To this affect, the microcatheter and the Catalyst guide catheter combination was then advanced as described above using biplane roadmap technique and constant fluoroscopic guidance over a 0.014 inch Softip Synchro micro guidewire. Again access was obtained into the M2-M3 region of the inferior division of the left middle cerebral artery. The guidewire was removed. Again after having confirmed safe positioning of the distal microcatheter, a 4 x 40 mm Solitaire FR stent retrieval device was then advanced and positioned covering the entirety of the abnormal filling defects in the occluded  superior division. With flow arrest established in the proximal left internal carotid artery by inflating the balloon, the combination of the retrieval device, the microcatheter and the 5 [place] Catalyst guide catheter was then performed gently and slowly as aspiration with a 60 mL syringe was continued. The aspirate was removed. Chunks of clot were seen within the aspirate. Also seen were little bits of clot in the interstices of the retrieval device. Aspiration was continued whilst the balloon in the proximal left internal carotid artery was deflated. A control arteriogram performed through the 8 [place] Flowgate guide catheter in the left internal carotid artery demonstrated complete angiographic revascularization  of the left middle cerebral artery distribution. There was no change in the anterior cerebral artery on the left. Also seen were at least 1 or 2 very small twigs of the anterior perisylvian branches being cut off with a small focal area of hypoperfusion. Also demonstrated at this time was anterior opacification involving the proximal caval cavernous segment of the left internal carotid artery with a slow delayed opacification of the left cavernous sinus and subsequently the right cavernous sinus. 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 Arteriograms were then performed at 15 and 30 minutes which continued to demonstrate no change in this fistulous communication. Intracranially no change was noted. No angiographic filling defects or extravasations were seen. No mass-effect was seen either. Throughout the procedure the patient's neurological status and blood pressure remained stable. The 8 [place] Flowgate guide catheter was then retrieved and removed. At this time a 5 [place] JB 1 catheter was then advanced through the 8 [place] neurovascular sheath into the right common carotid artery. An arteriogram performed demonstrated the right external carotid artery and its  major branches to be widely patent. The right internal carotid artery at the bulb to the cranial skull base opacified normally. The petrous, cavernous and the supraclinoid segments were seen to opacify normally. A right posterior communicating artery was seen to opacify the right posterior cerebral artery distribution. The right middle cerebral artery was seen to opacify normally into the capillary and the venous phases. A hypoplastic left anterior cerebral artery was also seen to opacify to its tertiary branches and normally into the capillary and the venous phases. An 8 [place] neurovascular sheath was then exchanged over a J-tip guidewire for a 9 [place] Pinnacle sheath. This was then connected to continuous heparinized saline infusion. The patient was then transported to the CT scanner for postprocedural CT scan of brain. IMPRESSION: Status post endovascular complete revascularization of the occluded left middle cerebral artery using two passes with the solitaire FR stent retrieval device 40 x 4 mm, and the infusion of 5 mg of superselective intracranial intra-arterial tPA with achievement of a TICI 2b reperfusion. Small hairline twigs involving the anterior perisylvian region may represent chronic ischemia as was seen on the initial CT scan of the brain. Electronically Signed   By: [name] M.D.   On: 07/14/2015 12:51    ASSESSMENT/PLAN: 40M with pAF and HFrEF (EF nadir 35-40% with improvement to 40-45%) who presents with an acute ischemic stroke on 07/11/15 and developed AF with RVR. It is probable that his stroke was AF related. His CHADSVASC is now 4 (CHF, age, CVA) and requires systemic anticoagulation. He has converted to NSR with HR 80-105bpm.   1. Started on Apixaban for anticaoagulation 2. Continue Amio '200mg'$  BID to maintain NSR.  3. Baseline PFTs with DLCO showed reduced corrected DLCO at43%.  He has a remote history of tobacco use.   4. LFTs and TSH are normal. Will check TSH for  baseline since starting Amio.  Would recommend pulmonary eval to make sure ok to continue Amio long term.  If Pulmonary feels ok to continue Amio, would send home on Amio '200mg'$  BID with followup with Cardiology in 1-2 weeks.    Sueanne Margarita, MD  07/16/2015  9:45 AM

## 2015-07-16 NOTE — Progress Notes (Signed)
STROKE TEAM PROGRESS NOTE   SUBJECTIVE (INTERVAL HISTORY)  Patient up in chair. States he is doing will; Dr Radford Pax examining patient   OBJECTIVE Temp:  [98.2 F (36.8 C)-98.7 F (37.1 C)] 98.3 F (36.8 C) (03/08 0944) Pulse Rate:  [84-96] 86 (03/08 0944) Cardiac Rhythm:  [-] Normal sinus rhythm (03/08 0700) Resp:  [16-20] 20 (03/08 0944) BP: (125-144)/(70-96) 129/70 mmHg (03/08 0944) SpO2:  [97 %-99 %] 98 % (03/08 0944)  CBC:   Recent Labs Lab 07/11/15 2152  07/12/15 0558  07/14/15 0409 07/15/15 0310  WBC 7.1  --  8.3  < > 7.3 5.5  NEUTROABS 2.8  --  6.3  --   --   --   HGB 13.8  < > 12.1*  < > 10.8* 10.0*  HCT 40.4  < > 35.2*  < > 33.4* 31.4*  MCV 90.8  --  88.9  < > 91.5 91.3  PLT 178  --  160  < > 142* 137*  < > = values in this interval not displayed.  Basic Metabolic Panel:   Recent Labs Lab 07/14/15 0409 07/15/15 0310  NA 145 144  K 3.8 3.9  CL 115* 111  CO2 24 24  GLUCOSE 107* 87  BUN 8 9  CREATININE 0.88 0.83  CALCIUM 7.9* 8.2*    Lipid Panel:     Component Value Date/Time   CHOL 182 07/12/2015 0558   TRIG 209* 07/12/2015 0558   TRIG 207* 07/12/2015 0558   HDL 21* 07/12/2015 0558   CHOLHDL 8.7 07/12/2015 0558   VLDL 41* 07/12/2015 0558   LDLCALC 120* 07/12/2015 0558   HgbA1c:  Lab Results  Component Value Date   HGBA1C 5.9* 07/12/2015   Urine Drug Screen:     Component Value Date/Time   LABOPIA NONE DETECTED 07/11/2015 2243   COCAINSCRNUR NONE DETECTED 07/11/2015 2243   LABBENZ NONE DETECTED 07/11/2015 2243   AMPHETMU NONE DETECTED 07/11/2015 2243   THCU NONE DETECTED 07/11/2015 2243   LABBARB NONE DETECTED 07/11/2015 2243      IMAGING  Ct Head Wo Contrast 07/11/2015   1. No acute intracranial pathology seen on CT.  2. Mild cortical volume loss and scattered small vessel ischemic microangiopathy.   CT head 07/11/2015   Dense LEFT MCA and LEFT insular ribbon sign consistent with acute LEFT middle cerebral artery territory  infarct.   CTA neck   07/11/2015   Negative.   CTA head 07/11/2015   Emergent distal LEFT M1 occlusion. 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   Cerebral angiogram 07/11/2015 S/P bilateral common carotid arteriogram,followed by endovascular revascularization of occluded Lt MCA M1 seg With x 2 passes with the solitaire FR 13m x 40 mm retrieval device And 5 mg of superselective IA tpa. TICI 2 b reperfusion achieved. Small slow flow Lt CC fistula noted with no compromise of distal ICA flow  Ct Head Wo Contrast 07/12/2015   Faint contrast staining LEFT posterior temporal lobe, with improved visualization gray-white matter junction, no hemorrhage.   MRI and MRA brain 07/13/2015 1. Acute infarct of roughly 1/3 left MCA territory, most confluent in the inferior territory. No suspected hemorrhagic conversion. 2. Motion degraded study which particularly affects the MRA. The re-opened left M1 segment remains patent. No evidence of proximal stenosis or occlusion. 3. 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   Ct Head Wo Contrast 07/11/2015   1. No acute intracranial pathology seen on CT.  2. Mild  cortical volume loss and scattered small vessel ischemic microangiopathy.   Portable Chest Xray 07/12/2015   1. Endotracheal tube seen ending 1-2 cm above the carina. This could be retracted 2 cm. 2. Minimal bibasilar atelectasis noted. Mild vascular congestion seen. Radiology recommendation was to retract 2 cm  07/13/2015 Minimal RIGHT basilar atelectasis.  Tip of endotracheal tube projects 4.4 cm above carina.  07/14/2015 1. Mild mediastinal prominence. This may be related AP technique and patient rotation. Follow-up PA and lateral chest x-ray suggested to further evaluate. 2. Low lung volumes. Mild bibasilar atelectasis and/or infiltrates. Small left pleural effusion. 3. Stable mild cardiomegaly. No pulmonary venous congestion .  2D  Echocardiogram  - Left ventricle: The cavity size was normal. Systolic function wasmildly reduced. The estimated ejection fraction was in the rangeof 45% to 50%. There is akinesis of the basal-midinferolateralmyocardium. Doppler parameters are consistent with abnormal leftventricular relaxation (grade 1 diastolic dysfunction). - Aortic valve: There was trivial regurgitation. - Mitral valve: There was mild regurgitation. Impressions:  EF improved from prior. No cardiac source of emboli wasindentified.   PHYSICAL EXAM Gen: NAD Chest:  CTA                   CV: no MRG, no carotid bruits, no peripheral edema Extrem:  Warm to touch, pulses intact  Neurological Exam:Awake and alert oriented 3. Mild expressive aphasia with word finding difficulties and mild perseveration. Nonfluent speech. Difficulty following 3 step commands. Able to show 2 fingers. Able to name.   Mental Status:  Awake and alert. VFF - could count fingers. R facial weakness.   Motor/Sensory:  mild R hemiparesis. No arm drift. Able to raise both legs off bed.   Coordination/Gait:  No UE ataxia. Finger-nose-finger ok.  Gair Deferred during exam.   ASSESSMENT/PLAN Phillip Solis is a 70 y.o. male with history of paroxysmal atrial fibrillation (not anticoagulated), palpitations, and congestive heart failure with ejection fraction 35-40% presenting with right hemiparesis and speech difficulties. He did receive IV TPA at 2230 on Friday, 07/11/2015. Taken to intervention. He received mechanical thrombectomy and IA TPA with TICI2B revascularization. He has done well but has mild residual expressive aphasia. He passed swallow eval and started on anticoagulation as well as rate control medications for atrial fibrillation.   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 Infarct felt to be embolic secondary to atrial  fibrillation.  Resultant right hemiparesis, mild receptive aphasia with global perseveration, dysphagia  MRI  Left MCA infarct one third territory  MRA left M1 segment remains open  CTA of the neck negative  2D Echo  EF 45-50%, no source of embolus  LDL - 120  HgbA1c 5.9  VTE prophylaxis - SCDs DIET DYS 3 Room service appropriate?: Yes; Fluid consistency:: Thin  aspirin 81 mg daily prior to admission, started on Eliquis (apixaban) daily   Patient counseled to be compliant with his antithrombotic medications  Ongoing aggressive stroke risk factor management  Therapy recommendations: CIR. 3N1. Rehab admissions coordinator following.   Disposition: CLR(lives at home with wife in [place])   . Await CIR bed once can tolerate therapies  Respiratory failure, resolved  Intubated for neuro intervention  Atrial Fibrillation with RVR  Initially diagnosed during the endoscopy procedure in 2011 without recurrence  Home anticoagulation:  None Due to low CHA2DS2-VASc score of 0  CHA2DS2-VASc Score = 5, ?2 oral anticoagulation recommended  Age [age]  Years:  82-73   +1  Sex:  Male   0  Hypertension History:  yes   +1     Diabetes Mellitus:  0   Congestive Heart Failure History:  yes   +1  Vascular Disease History:  0     Stroke/TIA/Thromboembolism History:  yes   +2  Treated with neo drip. No amiodarone, given risk for recurrent stroke with A. Fib to normal sinus rhythm conversion, particularly ANTICOAGULATION. No beta blocker or calcium channel blocker need to prioritize adequate blood pressure to allow cerebral perfusion.  Started on digoxin by cardiology.    Started on Eliquis in the hospital. continue at Discharge.  Converted to NSR over night - will keep on tele on the floor while awaiting rehab bed.   Hypertension  Blood pressure tends to run mildly low. (Low ejection fraction)  Hyperlipidemia  Home meds: Niaspan prior to admission - not reordered  LDL 120, goal <  70  Add lipitor 20 mg daily  Continue statin at discharge  Other Stroke Risk Factors  Advanced age  Cigarette smoker, quit smoking 42 years ago.  ETOH use  Other Active Problems  Mild anemia  CHF with LV systolic dysfunction diagnosed in 2016. No coronary artery disease. Managed with Entresto and bisoprolol and EF has improved  Hospital day # Smiley Scurry for Pager information 07/16/2015 12:41 PM  I have personally examined this patient, reviewed notes, independently viewed imaging studies, participated in medical decision making and plan of care. I have made any additions or clarifications directly to the above note. Agree with note above. He remains at risk for recurrent stroke, TIA and needs ongoing anticoagulation. Await rehab bed Antony Contras, MD Medical Director [facility] Pager: [phone] 07/16/2015 12:41 PM   To contact Stroke Continuity provider, please refer to [email or web address] After hours, contact General Neurology

## 2015-07-16 NOTE — Clinical Social Work Placement (Signed)
   CLINICAL SOCIAL WORK PLACEMENT  NOTE  Date:  07/16/2015  Patient Details  Name: Phillip Solis MRN: [id number] Date of Birth: Jul 02, 1945  Clinical Social Work is seeking post-discharge placement for this patient at the [place] level of care (*CSW will initial, date and re-position this form in  chart as items are completed):  Yes   Patient/family provided with [facility] Work Department's list of facilities offering this level of care within the geographic area requested by the patient (or if unable, by the patient's family).  Yes   Patient/family informed of their freedom to choose among providers that offer the needed level of care, that participate in Medicare, Medicaid or managed care program needed by the patient, have an available bed and are willing to accept the patient.  Yes   Patient/family informed of [facility]'s ownership interest in [facility] and [facility], as well as of the fact that they are under no obligation to receive care at these facilities.  PASRR submitted to EDS on 07/16/15     PASRR number received on 07/16/15     Existing PASRR number confirmed on       FL2 transmitted to all facilities in geographic area requested by pt/family on 07/16/15     FL2 transmitted to all facilities within larger geographic area on 07/16/15     Patient informed that his/her managed care company has contracts with or will negotiate with certain facilities, including the following:            Patient/family informed of bed offers received.  Patient chooses bed at       Physician recommends and patient chooses bed at      Patient to be transferred to   on  .  Patient to be transferred to facility by       Patient family notified on   of transfer.  Name of family member notified:        PHYSICIAN Please sign FL2     Additional Comment:    _______________________________________________ Ross Ludwig,  LCSWA 07/16/2015, 7:30 PM

## 2015-07-16 NOTE — Progress Notes (Signed)
Patient ID: Phillip Solis, male   DOB: 07-15-1945, 70 y.o.   MRN: [id number]    Referring Physician(s): Roland Rack  Supervising Physician: Dr. [name]  Chief Complaint: Left MCA CVA  Subjective: Patient doing well.  Follows commands, eating well, mobilizing well.  Minimal aphasia   Allergies: Review of patient's allergies indicates no known allergies.  Medications: Prior to Admission medications   Medication Sig Start Date End Date Taking? Authorizing Provider  aspirin EC 81 MG tablet Take 1 tablet (81 mg total) by mouth daily. 05/21/15  Yes [name], MD  bisoprolol (ZEBETA) 5 MG tablet Take 1 tablet (5 mg total) by mouth 2 (two) times daily. 05/21/15  Yes [name], MD  Multiple Vitamin (MULTIVITAMIN WITH MINERALS) TABS tablet Take 1 tablet by mouth daily.   Yes Historical Provider, MD  Niacin, Antihyperlipidemic, (NIASPAN PO) Take 1 tablet by mouth daily.   Yes Historical Provider, MD    Vital Signs: BP 129/70 mmHg  Pulse 86  Temp(Src) 98.3 F (36.8 C) (Oral)  Resp 20  Ht 6' (1.829 m)  Wt 166 lb 0.1 oz (75.3 kg)  BMI 22.51 kg/m2  SpO2 98%  Physical Exam: Gen: NAD Neuro: good strength bilaterally.  Moves all 4 extremities without limits.  Has good sensation. Cranial nerves are grossly intact Skin: right groin site is c/d/i.  Small area of induration, but no bruit or concerning features.  Some ecchymosis noted  Imaging: Dg Chest 1 View  07/13/2015  CLINICAL DATA:  Pulmonary edema, history atrial fibrillation, palpitations, former smoker, stroke, dilated non ischemic cardiomyopathy EXAM: CHEST 1 VIEW COMPARISON:  Portable exam 0929 hours compared to 07/12/2015 FINDINGS: Tip of endotracheal tube projects 4.4 cm above carina. Nasogastric tube extends into stomach. Normal heart size, mediastinal contours, and pulmonary vascularity. Minimal RIGHT basilar atelectasis. Lungs otherwise clear. No pleural effusion or pneumothorax. Question RIGHT nipple  shadow not seen prior chest radiograph. IMPRESSION: Minimal RIGHT basilar atelectasis. Electronically Signed   By: Lavonia Dana M.D.   On: 07/13/2015 09:47   Ct Head Wo Contrast  07/13/2015  CLINICAL DATA:  Follow-up examination for stroke. Status post catheter directed revascularization with intra-arterial tPA. EXAM: CT HEAD WITHOUT CONTRAST TECHNIQUE: Contiguous axial images were obtained from the base of the skull through the vertex without intravenous contrast. COMPARISON:  Multiple previous studies from 07/11/2015 and 07/12/2015. FINDINGS: Age-related cerebral atrophy with chronic small vessel ischemic type changes again noted. There is evolving patchy hypodensity within the left MCA territory involving the left insular region with extension into the left lentiform nucleus, compatible with evolving left MCA territory infarct focal hypodensity within the left temporal region consistent with evolving ischemia. Probable additional patchy hypodensity with subtle loss of gray-white matter differentiation more superficially and posteriorly within the posterior left frontal lobe. No significant mass effect or evidence for hemorrhagic transformation. Previously seen dense left M1 segment no longer visualized. No other acute or evolving large vessel territory infarct. No intracranial hemorrhage. No mass lesion, midline shift, or mass effect. No hydrocephalus. No extra-axial fluid collection. Scalp soft tissues within normal limits. No acute abnormality about the orbits. Scattered mucosal thickening within the paranasal sinuses. Mastoid air cells grossly clear. Middle ear cavities clear. Calvarium intact. IMPRESSION: 1. Evolving patchy acute left MCA territory ischemic infarcts as above. No significant mass effect or evidence of hemorrhagic transformation. 2. No other new acute intracranial process. 3. 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 Electronically Signed   By: Jeannine Boga  M.D.   On:  07/13/2015 01:53   Mr Brain Wo Contrast  07/13/2015  CLINICAL DATA:  Right-sided weakness and difficulty speaking EXAM: MRI HEAD WITHOUT CONTRAST MRA HEAD WITHOUT CONTRAST TECHNIQUE: Multiplanar, multiecho pulse sequences of the brain and surrounding structures were obtained without intravenous contrast. Angiographic images of the head were obtained using MRA technique without contrast. COMPARISON:  Multiple recent head CT FINDINGS: Motion degraded study which could obscure pathology. MRI HEAD FINDINGS Calvarium and upper cervical spine: No focal marrow signal abnormality. Orbits: Negative. Sinuses and Mastoids: Chronic sinusitis with left predominant moderate mucosal thickening. Bilateral mastoid fluid or mucosal thickening with clear nasopharynx. Brain: Restricted diffusion which is most confluent in the left MCA territory inferior division, including the posterior insula, posterior putamen, patchy deep white matter tracts, and lateral left temporal lobe. There is patchy infarcts along the high left frontal and low left parietal lobes. Well-developed cytotoxic edema without midline shift or herniation. No suspected hemorrhagic conversion; susceptibility artifact at the right splenium of the corpus callosum and at the base of the left temporal parietal infarct is attributed to developmental venous anomalies based on prior head CTs. No hydrocephalus. No evidence of major vessel occlusion. MRA HEAD FINDINGS Severely limited due to motion, with doubling or distortion of all vessels. This could easily obscure stenosis after occlusion. An aneurysm would likely be missed. Fetal type right PCA.  No other notable anatomic variant. There is symmetric signal within the bilateral MCA branches with no residual narrowing seen at the site of recent left M1 occlusion. IMPRESSION: 1. Acute infarct of roughly 1/3 left MCA territory, most confluent in the inferior territory. No suspected hemorrhagic conversion. 2. Motion degraded  study which particularly affects the MRA. The re- opened left M1 segment remains patent. No evidence of proximal stenosis or occlusion. 3. 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 Electronically Signed   By: Monte Fantasia M.D.   On: 07/13/2015 19:08   Dg Chest Port 1 View  07/14/2015  CLINICAL DATA:  Respiratory failure. EXAM: PORTABLE CHEST 1 VIEW COMPARISON:  07/13/2015, 09-18-2014, 01/11/2010. FINDINGS: Interim extubation and removal of NG tube. Mild mediastinal prominence, this may be related AP technique and patient rotation. Follow-up PA and lateral chest x-ray suggested. Cardiomegaly. No pulmonary venous congestion. Low lung volumes with mild bibasilar atelectasis and/or infiltrates. Small left pleural effusion. No pneumothorax. IMPRESSION: 1. Mild mediastinal prominence. This may be related AP technique and patient rotation. Follow-up PA and lateral chest x-ray suggested to further evaluate. 2. Low lung volumes. Mild bibasilar atelectasis and/or infiltrates. Small left pleural effusion. 3. Stable mild cardiomegaly.  No pulmonary venous congestion . Electronically Signed   By: Marcello Moores  Register   On: 07/14/2015 07:28   Dg Abd Portable 1v  07/12/2015  CLINICAL DATA:  Orogastric tube placement.  Initial encounter. EXAM: PORTABLE ABDOMEN - 1 VIEW COMPARISON:  None. FINDINGS: The patient's enteric tube is noted ending overlying the body of the stomach. The side-port is noted about the fundus of the stomach. The visualized bowel gas pattern is unremarkable. Scattered air and stool filled loops of colon are seen; no abnormal dilatation of small bowel loops is seen to suggest small bowel obstruction. No free intra-abdominal air is identified, though evaluation for free air is limited on a single supine view. The visualized osseous structures are within normal limits; the sacroiliac joints are unremarkable in appearance. The visualized lung bases are essentially  clear. IMPRESSION: Enteric tube noted ending overlying the body  of the stomach. Electronically Signed   By: Garald Balding M.D.   On: 07/12/2015 23:58   Mr Jodene Nam Head/brain Wo Cm  07/13/2015  CLINICAL DATA:  Right-sided weakness and difficulty speaking EXAM: MRI HEAD WITHOUT CONTRAST MRA HEAD WITHOUT CONTRAST TECHNIQUE: Multiplanar, multiecho pulse sequences of the brain and surrounding structures were obtained without intravenous contrast. Angiographic images of the head were obtained using MRA technique without contrast. COMPARISON:  Multiple recent head CT FINDINGS: Motion degraded study which could obscure pathology. MRI HEAD FINDINGS Calvarium and upper cervical spine: No focal marrow signal abnormality. Orbits: Negative. Sinuses and Mastoids: Chronic sinusitis with left predominant moderate mucosal thickening. Bilateral mastoid fluid or mucosal thickening with clear nasopharynx. Brain: Restricted diffusion which is most confluent in the left MCA territory inferior division, including the posterior insula, posterior putamen, patchy deep white matter tracts, and lateral left temporal lobe. There is patchy infarcts along the high left frontal and low left parietal lobes. Well-developed cytotoxic edema without midline shift or herniation. No suspected hemorrhagic conversion; susceptibility artifact at the right splenium of the corpus callosum and at the base of the left temporal parietal infarct is attributed to developmental venous anomalies based on prior head CTs. No hydrocephalus. No evidence of major vessel occlusion. MRA HEAD FINDINGS Severely limited due to motion, with doubling or distortion of all vessels. This could easily obscure stenosis after occlusion. An aneurysm would likely be missed. Fetal type right PCA.  No other notable anatomic variant. There is symmetric signal within the bilateral MCA branches with no residual narrowing seen at the site of recent left M1 occlusion. IMPRESSION: 1. Acute  infarct of roughly 1/3 left MCA territory, most confluent in the inferior territory. No suspected hemorrhagic conversion. 2. Motion degraded study which particularly affects the MRA. The re- opened left M1 segment remains patent. No evidence of proximal stenosis or occlusion. 3. 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 Electronically Signed   By: Monte Fantasia M.D.   On: 07/13/2015 19:08    Labs:  CBC:  Recent Labs  07/12/15 0558 07/13/15 0826 07/14/15 0409 07/15/15 0310  WBC 8.3 7.9 7.3 5.5  HGB 12.1* 11.7* 10.8* 10.0*  HCT 35.2* 36.4* 33.4* 31.4*  PLT 160 160 142* 137*    COAGS:  Recent Labs  07/11/15 2152  INR 0.96  APTT 25    BMP:  Recent Labs  07/12/15 0558 07/13/15 0826 07/14/15 0409 07/15/15 0310  NA 138 139 145 144  K 4.0 3.7 3.8 3.9  CL 110 104 115* 111  CO2 19* '25 24 24  '$ GLUCOSE 106* 101* 107* 87  BUN '13 12 8 9  '$ CALCIUM 8.0* 7.8* 7.9* 8.2*  CREATININE 0.88 0.92 0.88 0.83  GFRNONAA >60 >60 >60 >60  GFRAA >60 >60 >60 >60    LIVER FUNCTION TESTS:  Recent Labs  07/11/15 2152 07/13/15 0826  BILITOT 0.4 1.0  AST 22 39  ALT 14* 19  ALKPHOS 62 57  PROT 5.9* 5.1*  ALBUMIN 3.3* 2.7*    Assessment and Plan: 1. Left MCA CVA with bil carotid arteriogram /endovasc revasc of occl left MCA M1 segment utilizing retrieval device/IA TPA 3/4 -patient seems to be recovering well -will have him follow up with Dr. Estanislado Pandy in 3-4 weeks.  Electronically Signed: Henreitta Cea 07/16/2015, 11:16 AM   I spent a total of 15 Minutes at the the patient's bedside AND on the patient's hospital floor or unit, greater than 50% of which was  counseling/coordinating care for left MCA CVA, s/p intervention

## 2015-07-16 NOTE — Progress Notes (Signed)
Occupational Therapy Treatment Patient Details Name: TAIJON VINK MRN: [id number] DOB: [birth year] Today's Date: 07/16/2015    History of present illness Pt presents with L MCA Infarct post tpa and Bil Cerebral Angiogram with revascularization.  Pt intubated 07/11/15 - 07/13/15 and now with A-fib.  Pt with hx of A-fib and CHF.     OT comments  Pt participated in ADL retraining session today with focus on functional mobility, transfers, toileting and grooming/bathing. He requires Min A for safety and sequencing as well as problem solving secondary to recent stroke, aphasia and decreased awareness of deficits. Recommend CIR for in-pt Rehab.   Follow Up Recommendations  CIR;Supervision/Assistance - 24 hour    Equipment Recommendations  3 in 1 bedside comode    Recommendations for Other Services Rehab consult    Precautions / Restrictions Precautions Precautions: Fall;Other (comment) Precaution Comments: Watch HR.  Keep under 150 Restrictions Weight Bearing Restrictions: No       Mobility Bed Mobility Overal bed mobility: Needs Assistance Bed Mobility: Supine to Sit     Supine to sit: Min guard     General bed mobility comments: Increased time to complete, but able to perform without physical A.    Transfers Overall transfer level: Needs assistance Equipment used: Straight cane Transfers: Sit to/from [facility] Sit to Stand: Min assist Stand pivot transfers: Min assist       General transfer comment: A for balance during transfers, safety and sequencing    Balance Overall balance assessment: Needs assistance Sitting-balance support: Feet supported;No upper extremity supported Sitting balance-Leahy Scale: Fair     Standing balance support: Single extremity supported;Bilateral upper extremity supported;During functional activity Standing balance-Leahy Scale: Fair Standing balance comment: Min guard-min assist during standing tasks and standing at sink  for grooming/ADL's                   ADL Overall ADL's : Needs assistance/impaired     Grooming: Wash/dry hands;Wash/dry face;Standing;Min guard Grooming Details (indicate cue type and reason): Standing at sink for grooming with supervision-min guard assist. Pt declined oral care Upper Body Bathing: Min guard;Standing;Cueing for sequencing Upper Body Bathing Details (indicate cue type and reason): Cuing for what to do for a sponge bath. Difficulty problem solving. Standing at sink with min guard assist. Pt would only be set-up seated. Lower Body Bathing: Min guard;Sit to/from stand;Cueing for sequencing Lower Body Bathing Details (indicate cue type and reason): Multiple verbal cues for initiation and sequencing Upper Body Dressing : Set up;Sitting       Toilet Transfer: Minimal assistance;Cueing for sequencing;Stand-pivot;Ambulation;Cueing for safety Toilet Transfer Details (indicate cue type and reason): HR at 89 prior to transfer and 99 after functional mobility in room/bathroom noted. Verbal cues for sequencing and intiaition, problem solving -  decreased awareness of deficits Toileting- Clothing Manipulation and Hygiene: Minimal assistance;Sit to/from stand;Cueing for sequencing       Functional mobility during ADLs: Minimal assistance;Cueing for safety;Cueing for sequencing;Cane General ADL Comments: Pt  with Brocas aphasia as well as decreased sequencing/problem solving skills and decreased balance all requring pt to receive assist with ADL's. Pt with decreased awareness of deficits. Oirented to person, place , used his cell phone for time/date but could state year despite multiple vc's and prompts. Cont to recommend CIR for in-pt Rehab to assist in maximizing independence.      Vision  Has glasses for reading.  Perception     Praxis      Cognition   Behavior During Therapy: WFL for tasks assessed/performed Overall Cognitive Status:  Impaired/Different from baseline Area of Impairment: Attention;Memory;Following commands;Safety/judgement;Awareness;Problem solving Orientation Level: Situation (Unable to generate year given multiple choices, used his phone for month, day.) Current Attention Level: Selective Memory: Decreased short-term memory  Following Commands: Follows one step commands with increased time;Follows multi-step commands inconsistently Safety/Judgement: Decreased awareness of safety;Decreased awareness of deficits Awareness: Intellectual Problem Solving: Slow processing;Decreased initiation;Difficulty sequencing;Requires verbal cues;Requires tactile cues General Comments: Pt improving in ability to follow directions, but struggles with multi-step commands, initiation and problem solving..      Extremity/Trunk Assessment               Exercises     Shoulder Instructions       General Comments      Pertinent Vitals/ Pain       Pain Assessment: No/denies pain  Home Living                                          Prior Functioning/Environment              Frequency Min 2X/week     Progress Toward Goals  OT Goals(current goals can now be found in the care plan section)  Progress towards OT goals: Progressing toward goals  Acute Rehab OT Goals Patient Stated Goal: Pt reports that he wants to go home. Discussed Rehab w/ pt  Plan Discharge plan remains appropriate    Co-evaluation                 End of Session Equipment Utilized During Treatment: Gait belt;Other (comment) [facility])   Activity Tolerance Patient tolerated treatment well   Patient Left in bed;with call bell/phone within reach;with bed alarm set   Nurse Communication Mobility status;Other (comment) (ADL's performed)        Time: 6389-3734 OT Time Calculation (min): 21 min  Charges: OT General Charges $OT Visit: 1 Procedure OT Treatments $Self Care/Home Management : 8-22  mins  [place], Amy Beth Dixon, OTR/L 07/16/2015, 11:15 AM

## 2015-07-16 NOTE — NC FL2 (Signed)
[place] LEVEL OF CARE SCREENING TOOL     IDENTIFICATION  Patient Name: MEHTAB DOLBERRY Birthdate: 08-14-1945 Sex: male Admission Date (Current Location): 07/11/2015  [facility] and [place] Number:  [employer] and Address:  [facility] [place]. [facility], [place] [address], [place], [place] [address]      Provider Number: [id number]  Attending Physician Name and Address:  Garvin Fila, MD  Relative Name and Phone Number:       Current Level of Care: Hospital Recommended Level of Care: [place] Prior Approval Number:    Date Approved/Denied:   PASRR Number: [id number]  Discharge Plan: SNF    Current Diagnoses: Patient Active Problem List   Diagnosis Date Noted  . 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 Atrial fibrillation with rapid ventricular response ([place]) 07/14/2015  . Stroke due to embolism ([place])   . Hemiparesis, aphasia, and dysphagia as late effects of stroke ([place])   . Chronic systolic congestive heart failure ([place])   . Tachycardia   . Tachypnea   . Acute blood loss anemia   . Thrombocytopenia ([place])   . Stroke (cerebrum) ([place]) 07/12/2015  . Acute respiratory failure with hypoxia ([place])   . Acute ischemic stroke ([place]) 07/11/2015  . 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 PAF (paroxysmal atrial fibrillation) ([place]) 106/30/2014  . DOE (dyspnea on exertion) 106/30/2014  . Tobacco abuse, in remission 106/30/2014  . Claudication ([place]) 106/30/2014    Orientation RESPIRATION BLADDER Height & Weight     Self, Situation, Place, Time  Normal Continent Weight: 166 lb 0.1 oz (75.3 kg) Height:  6' (182.9 cm)  BEHAVIORAL SYMPTOMS/MOOD NEUROLOGICAL BOWEL NUTRITION STATUS      Continent Diet (Dysphagia 3 (mechanical soft);Thin liquid)  AMBULATORY STATUS COMMUNICATION OF NEEDS Skin   Limited Assist Verbally Normal                       Personal Care Assistance Level of Assistance  Bathing, Dressing Bathing  Assistance: Limited assistance   Dressing Assistance: Limited assistance     Functional Limitations Info             SPECIAL CARE FACTORS FREQUENCY  PT (By licensed PT), OT (By licensed OT), Speech therapy     PT Frequency: 4x a week OT Frequency: 2x a week     Speech Therapy Frequency: 2x a week      Contractures      Additional Factors Info  Allergies, Code Status Code Status Info: Full Allergies Info: NKA           Current Medications (07/16/2015):  This is the current hospital active medication list Current Facility-Administered Medications  Medication Dose Route Frequency Provider Last Rate Last Dose  . acetaminophen (TYLENOL) tablet 650 mg  650 mg Oral Q4H PRN Greta Doom, MD   650 mg at 07/15/15 0741   Or  . acetaminophen (TYLENOL) suppository 650 mg  650 mg Rectal Q4H PRN Greta Doom, MD   650 mg at 07/13/15 1722  . amiodarone (PACERONE) tablet 200 mg  200 mg Oral BID Sueanne Margarita, MD   200 mg at 07/16/15 0956  . apixaban (ELIQUIS) tablet 5 mg  5 mg Oral BID Rebecka Apley, RPH   5 mg at 07/16/15 0737  . atorvastatin (LIPITOR) tablet 20 mg  20 mg Oral q1800 Donzetta Starch, NP   20 mg at 07/16/15 1735  . feeding supplement (  ENSURE ENLIVE) (ENSURE ENLIVE) liquid 237 mL  237 mL Oral BID BM Kara Mead V, MD   237 mL at 07/15/15 1400  . labetalol (NORMODYNE,TRANDATE) injection 10 mg  10 mg Intravenous Q10 min PRN Greta Doom, MD      . multivitamin with minerals tablet 1 tablet  1 tablet Oral Daily Donzetta Starch, NP   1 tablet at 07/16/15 0956  . pantoprazole (PROTONIX) injection 40 mg  40 mg Intravenous QHS Greta Doom, MD   40 mg at 07/15/15 2017  . [place]   Oral PRN Luz Brazen, MD      . senna-docusate (Senokot-S) tablet 1 tablet  1 tablet Oral QHS PRN Luz Brazen, MD         Discharge Medications: Please see discharge summary for a list of discharge medications.  Relevant Imaging  Results:  Relevant Lab Results:   Additional Information SSN [id number]  Norval Morton Marcelo Baldy    I have personally examined this patient, reviewed notes, independently viewed imaging studies, participated in medical decision making and plan of care. I have made any additions or clarifications directly to the above note. Agree with note above.   Antony Contras, MD Medical Director [facility] Pager: [phone] 07/17/2015 4:52 PM

## 2015-07-16 NOTE — Progress Notes (Signed)
I await insurance decision for a possible inpt rehab admission. I do not have a bed available for this pt today if approved. I have updated RN CM. [phone]

## 2015-07-16 NOTE — Clinical Social Work Note (Signed)
Clinical Social Work Assessment  Patient Details  Name: Phillip Solis MRN: [id number] Date of Birth: December 06, 1945  Date of referral:  07/16/15               Reason for consult:  Facility Placement                Permission sought to share information with:  Facility Sport and exercise psychologist, Family Supports Permission granted to share information::  Yes, Verbal Permission Granted  Name::     [place] Level::  SNF admissions  Relationship::     Contact Information:     Housing/Transportation Living arrangements for the past 2 months:  Single Family Home Source of Information:  Patient Patient Interpreter Needed:  None Criminal Activity/Legal Involvement Pertinent to Current Situation/Hospitalization:  No - Comment as needed Significant Relationships:  Spouse Lives with:  Spouse Do you feel safe going back to the place where you live?  Yes Need for family participation in patient care:  No (Coment)  Care giving concerns: Patient wants to return back home but PT is recommending SNF   Social Worker assessment / plan:  Patient is a 70 year old male who is married lives with his wife and son.  Patient is alert and oriented x4 and able to make his own decisions.  Patient expressed that he has not been to rehab before at a SNF, and would rather not go.  Patient expressed that he would like to go back home, and does not want to go to SNF.  CSW explained to patient the benefit of going to SNF, patient still does not seem interested.  Patient's wife informed CIR that she agrees to having patient go to SNF, however CSW was not able to get a hold of his wife to discuss discharge planning.  Patient expresses that he has family who are able to provide support for patient.  Patient was explained role of CSW and bed search process, patient agreed hesitantly to allow CSW to begin bed search.  Patient was explained how insurance will pay for his stay.  Patient  expressed he did not have any other questions for CSW.   Employment status:  Retired Nurse, adult PT Recommendations:  [place] / Referral to community resources:  [place]  Patient/Family's Response to care: Patient does not want to go to SNF, but is agreeable to have CSW begin bed search process.  Patient/Family's Understanding of and Emotional Response to Diagnosis, Current Treatment, and Prognosis: Patient is aware of his current treatment plan and prognosis, and understands diagnosis.  Emotional Assessment Appearance:  Appears stated age Attitude/Demeanor/Rapport:    Affect (typically observed):  Appropriate, Stable, Calm Orientation:  Oriented to Self, Oriented to Place, Oriented to  Time, Oriented to Situation Alcohol / Substance use:  Not Applicable Psych involvement (Current and /or in the community):  No (Comment)  Discharge Needs  Concerns to be addressed:  Discharge Planning Concerns Readmission within the last 30 days:  No Current discharge risk:  None Barriers to Discharge:  Insurance Authorization   Anell Barr 07/16/2015, 7:11 PM

## 2015-07-16 NOTE — Progress Notes (Addendum)
I have received a denial for approval from [facility] for an inpt rehab admission. I have discussed with Dr. Leonie Man and we will now plan SNF rehab. I have left a message for pt's wife to contact me to inform her of the denial. I have notified RN CM, Vida Roller and SW, Eric. We will sign [phone] Wife called back and is aware of SNF plans and is in agreement.

## 2015-07-16 NOTE — Progress Notes (Signed)
PULMONARY  / CRITICAL CARE MEDICINE CONSULTATION   Name: Phillip Solis MRN: [id number] DOB: January 06, 1946    ADMISSION DATE:  07/11/2015 CONSULTATION DATE: July 16, 2015  REQUESTING CLINICIAN: Garvin Fila, MD PRIMARY SERVICE: [facility]  CHIEF COMPLAINT:  Altered Mental Status  BRIEF PATIENT DESCRIPTION: 70 y/o man with hx of CHF admitted 3/3 with ischemic stroke now s/p IR clot retrieval. Developed atrial fibrillation with RVR  SIGNIFICANT EVENTS / STUDIES:  3/3>>> IR directed mechanical clot retrieval 3/5 CT head>>>  1. Evolving patchy acute left MCA territory ischemic infarcts as above. No significant mass effect or evidence of hemorrhagic transformation.  2. No other new acute intracranial process.  3. 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 3/5 Af-RVR - cards consulted  LINES / TUBES: ETT 3/3>>>3/5   SUBJECTIVE:   Afebrile Improving with rehabilitation Denies pain  VITAL SIGNS: Temp:  [98.2 F (36.8 C)-98.7 F (37.1 C)] 98.3 F (36.8 C) (03/08 0944) Pulse Rate:  [84-96] 86 (03/08 0944) Resp:  [16-20] 20 (03/08 0944) BP: (118-144)/(70-96) 129/70 mmHg (03/08 0944) SpO2:  [95 %-99 %] 98 % (03/08 0944) HEMODYNAMICS:   VENTILATOR SETTINGS:   INTAKE / OUTPUT: Intake/Output      03/07 0701 - 03/08 0700 03/08 0701 - 03/09 0700   I.V. (mL/kg)     Total Intake(mL/kg)     Urine (mL/kg/hr) 675 (0.4) 300 (1.1)   Stool     Total Output 675 300   Net -675 -300          PHYSICAL EXAMINATION: General: No acute distress Neuro:  Alert, interactive , weak on rt 4/5 HEENT:  MMM,  Neck: No LAD Cardiovascular:  Heart sounds dual and normal. Regular rate. Lungs: resps even non labored, few scattered rhonchi Abdomen:  Soft Musculoskeletal:  No swollen joints Skin:  No rashes.  LABS:  CBC  Recent Labs Lab 07/13/15 0826 07/14/15 0409 07/15/15 0310  WBC 7.9 7.3 5.5  HGB 11.7* 10.8* 10.0*  HCT 36.4* 33.4* 31.4*  PLT 160 142* 137*    Coag's  Recent Labs Lab 07/11/15 2152  APTT 25  INR 0.96   BMET  Recent Labs Lab 07/13/15 0826 07/14/15 0409 07/15/15 0310  NA 139 145 144  K 3.7 3.8 3.9  CL 104 115* 111  CO2 '25 24 24  '$ BUN '12 8 9  '$ CREATININE 0.92 0.88 0.83  GLUCOSE 101* 107* 87   Electrolytes  Recent Labs Lab 07/13/15 0826 07/14/15 0409 07/15/15 0310  CALCIUM 7.8* 7.9* 8.2*   Sepsis Markers No results for input(s): LATICACIDVEN, PROCALCITON, O2SATVEN in the last 168 hours. ABG  Recent Labs Lab 07/12/15 0425  PHART 7.427  PCO2ART 28.8*  PO2ART 175*   Liver Enzymes  Recent Labs Lab 07/11/15 2152 07/13/15 0826  AST 22 39  ALT 14* 19  ALKPHOS 62 57  BILITOT 0.4 1.0  ALBUMIN 3.3* 2.7*   Cardiac Enzymes No results for input(s): TROPONINI, PROBNP in the last 168 hours. Glucose  Recent Labs Lab 07/14/15 1127 07/14/15 2028 07/14/15 2307 07/15/15 0341 07/15/15 0756 07/15/15 1209  GLUCAP 99 142* 107* 91 85 113*    Imaging No results found.  ASSESSMENT / PLAN:  Hx CHF - EF35% Af-RVR s/p amiodarone gtt  Reduced DLCO 36% predicted  Plan:    Digoxin per cards eliquis per neuro  PFTs show moderate restriction with a DLCO of 36%, FEV1 ratio is normal hence obstruction unlikely. 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 His chest  x-ray shows mild bibasal atelectasis I did not hear crackles on exam to suggest ILD . would be okay for short-term amiodarone 6-8 weeks   would advise pulmonary follow-up as outpatient  Kara Mead MD. FCCP. [facility] Pager [phone] If no response call [phone]     07/16/2015  10:44 AM

## 2015-07-17 MED ORDER — PANTOPRAZOLE SODIUM 40 MG PO TBEC
40.0000 mg | DELAYED_RELEASE_TABLET | Freq: Every day | ORAL | Status: DC
  Administered 2015-07-18: 40 mg via ORAL
  Filled 2015-07-17: qty 1

## 2015-07-17 NOTE — Clinical Social Work Note (Signed)
BSW intern has met with patient to present bed offers. Patient has politely declined offer and has informed BSW intern that he plans to d/c home. Patient also made BSW intern aware that he has very strong family supports. Patient lives with spouse, Phillip Solis who is willing to provide supervision. Patient also mentioned two adult children who are also available PRN. Patient has requested to speak with [facility] regarding equipment to bring home. BSW intern to make [facility] aware. BSW intern has contacted patient's spouse to update her on patient's current d/c plan. Patient's plans to return to residence, by car once medically stable.   BSW intern remains available.  [place] intern [phone]

## 2015-07-17 NOTE — Clinical Social Work Note (Signed)
Clinical Social Worker attempted to contact pt's wife, Mickel Baas and left a detailed message in regards to discharge planning. Patient currently refusing SNF placement.   CSW remains available as needed.  Glendon Axe, MSW, LCSWA [phone] 07/17/2015 3:18 PM

## 2015-07-17 NOTE — Progress Notes (Signed)
Pt wife still has not arrived at this time. Pt states that he is ready to go home. Md and case manager wanting to speak with wife before discharge.

## 2015-07-17 NOTE — Progress Notes (Signed)
STROKE TEAM PROGRESS NOTE   SUBJECTIVE (INTERVAL HISTORY)  Patient up in chair. States he is doing well and wants to go home. Inpatient rehab was declined and wife agreeable to short term SNF as she works during the day  OBJECTIVE Temp:  [97.5 F (36.4 C)-98.7 F (37.1 C)] 97.5 F (36.4 C) (03/09 1016) Pulse Rate:  [78-97] 97 (03/09 1016) Cardiac Rhythm:  [-] Normal sinus rhythm;Bundle branch block;Heart block (03/09 0800) Resp:  [16-22] 22 (03/09 1016) BP: (106-137)/(67-89) 137/89 mmHg (03/09 1016) SpO2:  [96 %-100 %] 100 % (03/09 1016)  CBC:   Recent Labs Lab 07/11/15 2152  07/12/15 0558  07/14/15 0409 07/15/15 0310  WBC 7.1  --  8.3  < > 7.3 5.5  NEUTROABS 2.8  --  6.3  --   --   --   HGB 13.8  < > 12.1*  < > 10.8* 10.0*  HCT 40.4  < > 35.2*  < > 33.4* 31.4*  MCV 90.8  --  88.9  < > 91.5 91.3  PLT 178  --  160  < > 142* 137*  < > = values in this interval not displayed.  Basic Metabolic Panel:   Recent Labs Lab 07/14/15 0409 07/15/15 0310  NA 145 144  K 3.8 3.9  CL 115* 111  CO2 24 24  GLUCOSE 107* 87  BUN 8 9  CREATININE 0.88 0.83  CALCIUM 7.9* 8.2*    Lipid Panel:     Component Value Date/Time   CHOL 182 07/12/2015 0558   TRIG 209* 07/12/2015 0558   TRIG 207* 07/12/2015 0558   HDL 21* 07/12/2015 0558   CHOLHDL 8.7 07/12/2015 0558   VLDL 41* 07/12/2015 0558   LDLCALC 120* 07/12/2015 0558   HgbA1c:  Lab Results  Component Value Date   HGBA1C 5.9* 07/12/2015   Urine Drug Screen:     Component Value Date/Time   LABOPIA NONE DETECTED 07/11/2015 2243   COCAINSCRNUR NONE DETECTED 07/11/2015 2243   LABBENZ NONE DETECTED 07/11/2015 2243   AMPHETMU NONE DETECTED 07/11/2015 2243   THCU NONE DETECTED 07/11/2015 2243   LABBARB NONE DETECTED 07/11/2015 2243      IMAGING  Ct Head Wo Contrast 07/11/2015   1. No acute intracranial pathology seen on CT.  2. Mild cortical volume loss and scattered small vessel ischemic microangiopathy.   CT  head 07/11/2015   Dense LEFT MCA and LEFT insular ribbon sign consistent with acute LEFT middle cerebral artery territory infarct.   CTA neck   07/11/2015   Negative.   CTA head 07/11/2015   Emergent distal LEFT M1 occlusion. 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   Cerebral angiogram 07/11/2015 S/P bilateral common carotid arteriogram,followed by endovascular revascularization of occluded Lt MCA M1 seg With x 2 passes with the solitaire FR 50m x 40 mm retrieval device And 5 mg of superselective IA tpa. TICI 2 b reperfusion achieved. Small slow flow Lt CC fistula noted with no compromise of distal ICA flow  Ct Head Wo Contrast 07/12/2015   Faint contrast staining LEFT posterior temporal lobe, with improved visualization gray-white matter junction, no hemorrhage.   MRI and MRA brain 07/13/2015 1. Acute infarct of roughly 1/3 left MCA territory, most confluent in the inferior territory. No suspected hemorrhagic conversion. 2. Motion degraded study which particularly affects the MRA. The re-opened left M1 segment remains patent. No evidence of proximal stenosis or occlusion. 3. 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  Ct Head Wo Contrast 07/11/2015   1. No acute intracranial pathology seen on CT.  2. Mild cortical volume loss and scattered small vessel ischemic microangiopathy.   Portable Chest Xray 07/12/2015   1. Endotracheal tube seen ending 1-2 cm above the carina. This could be retracted 2 cm. 2. Minimal bibasilar atelectasis noted. Mild vascular congestion seen. Radiology recommendation was to retract 2 cm  07/13/2015 Minimal RIGHT basilar atelectasis.  Tip of endotracheal tube projects 4.4 cm above carina.  07/14/2015 1. Mild mediastinal prominence. This may be related AP technique and patient rotation. Follow-up PA and lateral chest x-ray suggested to further evaluate. 2. Low lung volumes. Mild bibasilar atelectasis and/or  infiltrates. Small left pleural effusion. 3. Stable mild cardiomegaly. No pulmonary venous congestion .  2D Echocardiogram  - Left ventricle: The cavity size was normal. Systolic function wasmildly reduced. The estimated ejection fraction was in the rangeof 45% to 50%. There is akinesis of the basal-midinferolateralmyocardium. Doppler parameters are consistent with abnormal leftventricular relaxation (grade 1 diastolic dysfunction). - Aortic valve: There was trivial regurgitation. - Mitral valve: There was mild regurgitation. Impressions:  EF improved from prior. No cardiac source of emboli wasindentified.   PHYSICAL EXAM Gen: NAD Chest:  CTA                   CV: no MRG, no carotid bruits, no peripheral edema Extrem:  Warm to touch, pulses intact  Neurological Exam:Awake and alert oriented 3. Mild expressive aphasia with word finding difficulties and mild perseveration. Nonfluent speech. Difficulty following 3 step commands. Able to show 2 fingers. Able to name.   Mental Status:  Awake and alert. VFF - could count fingers. R facial weakness.   Motor/Sensory:  mild R hemiparesis mostly hand weakness. No arm drift. Able to raise both legs off bed.   Coordination/Gait:  No UE ataxia. Finger-nose-finger ok.  Gair Deferred during exam.   ASSESSMENT/PLAN [name] is a 70 y.o. male with history of paroxysmal atrial fibrillation (not anticoagulated), palpitations, and congestive heart failure with ejection fraction 35-40% presenting with right hemiparesis and speech difficulties. He did receive IV TPA at 2230 on Friday, 07/11/2015. Taken to intervention. He received mechanical thrombectomy and IA TPA with TICI2B revascularization. He has done well but has mild residual expressive aphasia. He passed swallow eval and started on anticoagulation as well as rate control medications for atrial fibrillation.   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 Infarct felt to be embolic secondary to atrial fibrillation.  Resultant right hemiparesis, mild receptive aphasia with global perseveration, dysphagia  MRI  Left MCA infarct one third territory  MRA left M1 segment remains open  CTA of the neck negative  2D Echo  EF 45-50%, no source of embolus  LDL - 120  HgbA1c 5.9  VTE prophylaxis - SCDs DIET DYS 3 Room service appropriate?: Yes; Fluid consistency:: Thin  aspirin 81 mg daily prior to admission, started on Eliquis (apixaban) daily   Patient counseled to be compliant with his antithrombotic medications  Ongoing aggressive stroke risk factor management  Therapy recommendations: CIR. 3N1. Rehab admissions coordinator following.   Disposition: CLR(lives at home with wife in [place])   . Await CIR bed once can tolerate therapies  Respiratory failure, resolved  Intubated for neuro intervention  Atrial Fibrillation with RVR  Initially diagnosed during the endoscopy procedure in 2011 without recurrence  Home anticoagulation:  None Due to low CHA2DS2-VASc score of 0  CHA2DS2-VASc Score = 5, ?2 oral anticoagulation recommended  [age] Years[age]  66-74   +1  Sex:  Male   1  Hypertension History:  yes   +1     Diabetes Mellitus:  0   Congestive Heart Failure History:  yes   +1  Vascular Disease History:  0     Stroke/TIA/Thromboembolism History:  yes   +2  Treated with neo drip. No amiodarone, given risk for recurrent stroke with A. Fib to normal sinus rhythm conversion, particularly ANTICOAGULATION. No beta blocker or calcium channel blocker need to prioritize adequate blood pressure to allow cerebral perfusion.  Started on digoxin by cardiology.    Started on Eliquis in the hospital. continue at Discharge.  Converted to NSR over night - will keep on tele on the floor while awaiting rehab bed.   Hypertension  Blood pressure tends to run  mildly low. (Low ejection fraction)  Hyperlipidemia  Home meds: Niaspan prior to admission - not reordered  LDL 120, goal < 70  Add lipitor 20 mg daily  Continue statin at discharge  Other Stroke Risk Factors  Advanced age  Cigarette smoker, quit smoking 42 years ago.  ETOH use  Other Active Problems  Mild anemia  CHF with LV systolic dysfunction diagnosed in 2016. No coronary artery disease. Managed with Entresto and bisoprolol and EF has improved  Hospital day # Par[place]le [place] for Pager information 07/17/2015 12:46 PM  I have personally examined this patient, reviewed notes, independently viewed imaging studies, participated in medical decision making and plan of care. I have made any additions or clarifications directly to the above note.  Plan Dc to snf unless patient has supervision at home.will d/w wife Antony Contras, MD Medical Director [place] Pager: [phone] 07/17/2015 12:46 PM   To contact Stroke Continuity provider, please refer to [email or web address] After hours, contact General Neurology

## 2015-07-17 NOTE — Progress Notes (Addendum)
Physical Therapy Treatment Patient Details Name: Phillip Solis MRN: [id number] DOB: August 18, 1945 Today's Date: 07/17/2015    History of Present Illness Pt presents with L MCA Infarct post tpa and Bil Cerebral Angiogram with revascularization.  Pt intubated 07/11/15 - 07/13/15 and now with A-fib.  Pt with hx of A-fib and CHF.      PT Comments    HR at 102-105 during mobility today. Insurance has denied CIR. D/C recommendations updated to SNF. Pt is very adamant about returning home with HHPT instead. He reports he will have 24-hour assist from family. If pt denies SNF and family in agreement, he will need HHPT, 24-hour family assist, and RW.   Follow Up Recommendations  SNF;Supervision/Assistance - 24 hour     Equipment Recommendations  Rolling walker with 5" wheels    Recommendations for Other Services       Precautions / Restrictions Precautions Precautions: Fall;Other (comment) Precaution Comments: Watch HR.  Keep under 150 Restrictions Weight Bearing Restrictions: No    Mobility  Bed Mobility               General bed mobility comments: Pt received in recliner and returned to recliner after ambulation.  Transfers   Equipment used: Rolling walker (2 wheeled)   Sit to Stand: Min guard Stand pivot transfers: Min guard       General transfer comment: verbal cues for sequencing and safety. No physical assist.  Ambulation/Gait Ambulation/Gait assistance: Min guard Ambulation Distance (Feet): 130 Feet Assistive device: Rolling walker (2 wheeled) Gait Pattern/deviations: Decreased stride length;Trunk flexed;Step-through pattern   Gait velocity interpretation: Below normal speed for age/gender General Gait Details: Very slow, tedious gait pattern. No physical assist provided. Verbal cues for posture, safety and RW management.   Stairs            Wheelchair Mobility    Modified Rankin (Stroke Patients Only) Modified Rankin (Stroke Patients Only) Pre-Morbid  Rankin Score: No symptoms Modified Rankin: Moderate disability     Balance   Sitting-balance support: Feet supported;No upper extremity supported Sitting balance-Leahy Scale: Good     Standing balance support: During functional activity;Bilateral upper extremity supported Standing balance-Leahy Scale: Fair                      Cognition Arousal/Alertness: Awake/alert Behavior During Therapy: WFL for tasks assessed/performed Overall Cognitive Status: Impaired/Different from baseline Area of Impairment: Attention;Memory;Following commands;Safety/judgement;Problem solving   Current Attention Level: Selective Memory: Decreased recall of precautions;Decreased short-term memory Following Commands: Follows one step commands consistently;Follows multi-step commands inconsistently Safety/Judgement: Decreased awareness of safety;Decreased awareness of deficits   Problem Solving: Difficulty sequencing;Requires verbal cues General Comments: Improved ability to follow directions. Continues with decifits in safety and problem solving.    Exercises      General Comments        Pertinent Vitals/Pain Pain Assessment: No/denies pain    Home Living                      Prior Function            PT Goals (current goals can now be found in the care plan section) Acute Rehab PT Goals Patient Stated Goal: Pt reports that he wants to go home. PT Goal Formulation: With patient Time For Goal Achievement: 07/28/15 Potential to Achieve Goals: Good Progress towards PT goals: Progressing toward goals    Frequency  Min 4X/week    PT Plan Discharge plan needs to  be updated    Co-evaluation             End of Session Equipment Utilized During Treatment: Gait belt Activity Tolerance: Patient tolerated treatment well Patient left: in chair;with call bell/phone within reach;with chair alarm set     Time: 0935-1001 PT Time Calculation (min) (ACUTE ONLY): 26  min  Charges:  $Gait Training: 23-37 mins                    G Codes:      Lorriane Shire 07/17/2015, 10:10 AM

## 2015-07-17 NOTE — Progress Notes (Addendum)
SUBJECTIVE:  No complaints  OBJECTIVE:   Vitals:   Filed Vitals:   07/16/15 2139 07/17/15 0143 07/17/15 0543 07/17/15 1016  BP: 109/67 106/69 123/75 137/89  Pulse: 78 83 83 97  Temp: 98.6 F (37 C) 98.5 F (36.9 C) 98.7 F (37.1 C) 97.5 F (36.4 C)  TempSrc: Oral Oral Oral Oral  Resp: '20 20 20 22  '$ Height:      Weight:      SpO2: 98% 97% 96% 100%   I&O's:   Intake/Output Summary (Last 24 hours) at 07/17/15 1142 Last data filed at 07/17/15 0543  Gross per 24 hour  Intake      0 ml  Output    850 ml  Net   -850 ml   TELEMETRY: Reviewed telemetry pt in NSR:     PHYSICAL EXAM General: Well developed, well nourished, in no acute distress Head: Eyes PERRLA, No xanthomas.   Normal cephalic and atramatic  Lungs:   Clear bilaterally to auscultation and percussion. Heart:   HRRR S1 S2 Pulses are 2+ & equal. Abdomen: Bowel sounds are positive, abdomen soft and non-tender without masses Extremities:   No clubbing, cyanosis or edema.  DP +1 Neuro: Alert and oriented X 3. Psych:  Good affect, responds appropriately   LABS: Basic Metabolic Panel:  Recent Labs  07/15/15 0310  NA 144  K 3.9  CL 111  CO2 24  GLUCOSE 87  BUN 9  CREATININE 0.83  CALCIUM 8.2*   Liver Function Tests: No results for input(s): AST, ALT, ALKPHOS, BILITOT, PROT, ALBUMIN in the last 72 hours. No results for input(s): LIPASE, AMYLASE in the last 72 hours. CBC:  Recent Labs  07/15/15 0310  WBC 5.5  HGB 10.0*  HCT 31.4*  MCV 91.3  PLT 137*   Cardiac Enzymes: No results for input(s): CKTOTAL, CKMB, CKMBINDEX, TROPONINI in the last 72 hours. BNP: Invalid input(s): POCBNP D-Dimer: No results for input(s): DDIMER in the last 72 hours. Hemoglobin A1C: No results for input(s): HGBA1C in the last 72 hours. Fasting Lipid Panel: No results for input(s): CHOL, HDL, LDLCALC, TRIG, CHOLHDL, LDLDIRECT in the last 72 hours. Thyroid Function Tests:  Recent Labs  07/15/15 1020  TSH 0.889     Anemia Panel: No results for input(s): VITAMINB12, FOLATE, FERRITIN, TIBC, IRON, RETICCTPCT in the last 72 hours. Coag Panel:   Lab Results  Component Value Date   INR 0.96 07/11/2015   INR 0.99 07/15/2014    RADIOLOGY: Ct Angio Head W/cm &/or Wo Cm  07/11/2015  CLINICAL DATA:  Code stroke, RIGHT facial droop. Slurred speech and uneven pupils. History of atrial fibrillation. EXAM: CT ANGIOGRAPHY HEAD TECHNIQUE: Multidetector CT imaging of the head was performed using the standard protocol during bolus administration of intravenous contrast. Multiplanar CT image reconstructions and MIPs were obtained to evaluate the vascular anatomy. CONTRAST:  41m OMNIPAQUE IOHEXOL 350 MG/ML SOLN COMPARISON:  CT head July 11, 2015 at 2205 hours FINDINGS: CT HEAD Mildly dense LEFT MCA with LEFT insular ribbon sign. No intraparenchymal hemorrhage, mass effect, midline shift. Ventricles and sulci are overall normal for patient's age, cavum septum pellucidum is a normal variant. Subcentimeter density RIGHT parasagittal parietal lobe suggests developmental venous anomaly. No abnormal extra-axial fluid collections. Basal cisterns are patent. Ocular globes and orbital contents are normal. Paranasal sinuses and mastoid air cells are well aerated. No skull fracture. Multiple prominent vascular lakes at the convexity. Patient is edentulous. CTA NECK Aortic arch: Normal appearance of the thoracic  arch, normal branch pattern. Mild calcific atherosclerosis of the aortic arch. The origins of the innominate, left Common carotid artery and subclavian artery are widely patent. Right carotid system: Common carotid artery is widely patent, coursing in a straight line fashion. Normal appearance of the carotid bifurcation without hemodynamically significant stenosis by NASCET criteria. Normal appearance of the included internal carotid artery. Left carotid system: Common carotid artery is widely patent, coursing in a straight line fashion.  Normal appearance of the carotid bifurcation without hemodynamically significant stenosis by NASCET criteria. Mild eccentric calcific atherosclerosis. Normal appearance of the included internal carotid artery. Vertebral arteries:Left vertebral artery is dominant. Normal appearance of the vertebral arteries, which appear widely patent. Skeleton: No acute osseous process though bone windows have not been submitted. Moderate to severe multilevel degenerative discs resulting in moderate to severe neural foraminal narrowing C3-4, C4-5. Old mild T2 compression fracture with scoliosis. No destructive bony lesions. Other neck: Soft tissues of the neck are nonacute though, not tailored for evaluation. CTA HEAD Anterior circulation: Normal appearance of the cervical internal carotid arteries, petrous, cavernous and supra clinoid internal carotid arteries. Widely patent anterior communicating artery. LEFT A1 is developmentally dominant. Acute distal LEFT M1 segment occlusion. Minimal collateral present on CTA. RIGHT middle cerebral artery vessels are patent. Posterior circulation: Normal appearance of the vertebral arteries, vertebrobasilar junction and basilar artery, as well as main branch vessels. Normal appearance of the posterior cerebral arteries. Fetal origin RIGHT posterior cerebral artery. No hemodynamically significant stenosis, dissection, luminal irregularity, contrast extravasation or aneurysm within the anterior nor posterior circulation. 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 CTA neck:  Negative. CTA head: Emergent distal LEFT M1 occlusion. 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 Acute findings discussed with and reconfirmed by Dr.[facility]NEILL [facility] on 07/11/2015 at 10:23 pm. Electronically Signed   By: Elon Alas M.D.   On: 07/11/2015 23:00   Dg Chest 1 View  07/13/2015  CLINICAL DATA:   Pulmonary edema, history atrial fibrillation, palpitations, former smoker, stroke, dilated non ischemic cardiomyopathy EXAM: CHEST 1 VIEW COMPARISON:  Portable exam 0929 hours compared to 07/12/2015 FINDINGS: Tip of endotracheal tube projects 4.4 cm above carina. Nasogastric tube extends into stomach. Normal heart size, mediastinal contours, and pulmonary vascularity. Minimal RIGHT basilar atelectasis. Lungs otherwise clear. No pleural effusion or pneumothorax. Question RIGHT nipple shadow not seen prior chest radiograph. IMPRESSION: Minimal RIGHT basilar atelectasis. Electronically Signed   By: Lavonia Dana M.D.   On: 07/13/2015 09:47   Ct Head Wo Contrast  07/13/2015  CLINICAL DATA:  Follow-up examination for stroke. Status post catheter directed revascularization with intra-arterial tPA. EXAM: CT HEAD WITHOUT CONTRAST TECHNIQUE: Contiguous axial images were obtained from the base of the skull through the vertex without intravenous contrast. COMPARISON:  Multiple previous studies from 07/11/2015 and 07/12/2015. FINDINGS: Age-related cerebral atrophy with chronic small vessel ischemic type changes again noted. There is evolving patchy hypodensity within the left MCA territory involving the left insular region with extension into the left lentiform nucleus, compatible with evolving left MCA territory infarct focal hypodensity within the left temporal region consistent with evolving ischemia. Probable additional patchy hypodensity with subtle loss of gray-white matter differentiation more superficially and posteriorly within the posterior left frontal lobe. No significant mass effect or evidence for hemorrhagic transformation. Previously seen dense left M1 segment no longer visualized. No other acute or evolving large vessel territory infarct. No intracranial hemorrhage. No mass lesion, midline  shift, or mass effect. No hydrocephalus. No extra-axial fluid collection. Scalp soft tissues within normal limits. No acute  abnormality about the orbits. Scattered mucosal thickening within the paranasal sinuses. Mastoid air cells grossly clear. Middle ear cavities clear. Calvarium intact. IMPRESSION: 1. Evolving patchy acute left MCA territory ischemic infarcts as above. No significant mass effect or evidence of hemorrhagic transformation. 2. No other new acute intracranial process. 3. 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 Electronically Signed   By: Jeannine Boga M.D.   On: 07/13/2015 01:53   Ct Head Wo Contrast  07/12/2015  CLINICAL DATA:  Follow-up LEFT middle cerebral artery stroke, status post LEFT middle cerebral endovascular revascularization. EXAM: CT HEAD WITHOUT CONTRAST TECHNIQUE: Contiguous axial images were obtained from the base of the skull through the vertex without intravenous contrast. COMPARISON:  CT head July 11, 2015 FINDINGS: Faint contrast staining of LEFT posterior temporal lobe, improved visualization of insula gray-white matter junction. Postcontrast imaging without evidence of hemorrhage, mass effect or midline shift. Ventricles and sulci are normal for patient's age. Subcentimeter enhancement RIGHT parasagittal parietal lobe compatible with developmental venous anomaly. No abnormal extra-axial fluid collections. Minimal calcific atherosclerosis the carotid siphons. Old RIGHT medial orbital blowout fracture. Mild paranasal sinus mucosal thickening, life-support lines in place. Mastoid air cells are well aerated. Prominent vascular lakes at the vertex. IMPRESSION: Faint contrast staining LEFT posterior temporal lobe, with improved visualization gray-white matter junction, no hemorrhage. Electronically Signed   By: Elon Alas M.D.   On: 07/12/2015 02:05   Ct Head Wo Contrast  07/11/2015  CLINICAL DATA:  Code stroke. Right-sided facial droop and slurred speech. Uneven pupils. Initial encounter. EXAM: CT HEAD WITHOUT CONTRAST TECHNIQUE: Contiguous axial images were obtained  from the base of the skull through the vertex without intravenous contrast. COMPARISON:  CT of the head performed 04-13-202016 FINDINGS: There is no evidence of acute infarction, mass lesion, or intra- or extra-axial hemorrhage on CT. Prominence of the ventricles and sulci suggests mild cortical volume loss. Scattered subcortical and periventricular white matter change likely reflects small vessel ischemic microangiopathy, relatively stable from the prior study. Mild cerebellar atrophy is noted. The brainstem and fourth ventricle are within normal limits. The basal ganglia are unremarkable in appearance. The cerebral hemispheres demonstrate grossly normal gray-white differentiation. No mass effect or midline shift is seen. There is no evidence of fracture; chronic scattered calvarial lucencies are again noted at the vertex, relatively stable in appearance. The orbits are within normal limits. The paranasal sinuses and mastoid air cells are well-aerated. No significant soft tissue abnormalities are seen. IMPRESSION: 1. No acute intracranial pathology seen on CT. 2. Mild cortical volume loss and scattered small vessel ischemic microangiopathy. These results were called by telephone at the time of interpretation on 07/11/2015 at 10:18 pm to Dr. Leonel Ramsay, who verbally acknowledged these results. Electronically Signed   By: Garald Balding M.D.   On: 07/11/2015 22:21   Ct Angio Neck W/cm &/or Wo/cm  07/11/2015  CLINICAL DATA:  Code stroke, RIGHT facial droop. Slurred speech and uneven pupils. History of atrial fibrillation. EXAM: CT ANGIOGRAPHY HEAD TECHNIQUE: Multidetector CT imaging of the head was performed using the standard protocol during bolus administration of intravenous contrast. Multiplanar CT image reconstructions and MIPs were obtained to evaluate the vascular anatomy. CONTRAST:  29m OMNIPAQUE IOHEXOL 350 MG/ML SOLN COMPARISON:  CT head July 11, 2015 at 2205 hours FINDINGS: CT HEAD Mildly dense LEFT MCA  with LEFT insular ribbon sign. No intraparenchymal hemorrhage, mass  effect, midline shift. Ventricles and sulci are overall normal for patient's age, cavum septum pellucidum is a normal variant. Subcentimeter density RIGHT parasagittal parietal lobe suggests developmental venous anomaly. No abnormal extra-axial fluid collections. Basal cisterns are patent. Ocular globes and orbital contents are normal. Paranasal sinuses and mastoid air cells are well aerated. No skull fracture. Multiple prominent vascular lakes at the convexity. Patient is edentulous. CTA NECK Aortic arch: Normal appearance of the thoracic arch, normal branch pattern. Mild calcific atherosclerosis of the aortic arch. The origins of the innominate, left Common carotid artery and subclavian artery are widely patent. Right carotid system: Common carotid artery is widely patent, coursing in a straight line fashion. Normal appearance of the carotid bifurcation without hemodynamically significant stenosis by NASCET criteria. Normal appearance of the included internal carotid artery. Left carotid system: Common carotid artery is widely patent, coursing in a straight line fashion. Normal appearance of the carotid bifurcation without hemodynamically significant stenosis by NASCET criteria. Mild eccentric calcific atherosclerosis. Normal appearance of the included internal carotid artery. Vertebral arteries:Left vertebral artery is dominant. Normal appearance of the vertebral arteries, which appear widely patent. Skeleton: No acute osseous process though bone windows have not been submitted. Moderate to severe multilevel degenerative discs resulting in moderate to severe neural foraminal narrowing C3-4, C4-5. Old mild T2 compression fracture with scoliosis. No destructive bony lesions. Other neck: Soft tissues of the neck are nonacute though, not tailored for evaluation. CTA HEAD Anterior circulation: Normal appearance of the cervical internal carotid  arteries, petrous, cavernous and supra clinoid internal carotid arteries. Widely patent anterior communicating artery. LEFT A1 is developmentally dominant. Acute distal LEFT M1 segment occlusion. Minimal collateral present on CTA. RIGHT middle cerebral artery vessels are patent. Posterior circulation: Normal appearance of the vertebral arteries, vertebrobasilar junction and basilar artery, as well as main branch vessels. Normal appearance of the posterior cerebral arteries. Fetal origin RIGHT posterior cerebral artery. No hemodynamically significant stenosis, dissection, luminal irregularity, contrast extravasation or aneurysm within the anterior nor posterior circulation. 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 CTA neck:  Negative. CTA head: Emergent distal LEFT M1 occlusion. 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 Acute findings discussed with and reconfirmed by Dr.[facility]ILL [facility] on 07/11/2015 at 10:23 pm. Electronically Signed   By: Elon Alas M.D.   On: 07/11/2015 23:00   Mr Brain Wo Contrast  07/13/2015  CLINICAL DATA:  Right-sided weakness and difficulty speaking EXAM: MRI HEAD WITHOUT CONTRAST MRA HEAD WITHOUT CONTRAST TECHNIQUE: Multiplanar, multiecho pulse sequences of the brain and surrounding structures were obtained without intravenous contrast. Angiographic images of the head were obtained using MRA technique without contrast. COMPARISON:  Multiple recent head CT FINDINGS: Motion degraded study which could obscure pathology. MRI HEAD FINDINGS Calvarium and upper cervical spine: No focal marrow signal abnormality. Orbits: Negative. Sinuses and Mastoids: Chronic sinusitis with left predominant moderate mucosal thickening. Bilateral mastoid fluid or mucosal thickening with clear nasopharynx. Brain: Restricted diffusion which is most confluent in the left MCA territory inferior division,  including the posterior insula, posterior putamen, patchy deep white matter tracts, and lateral left temporal lobe. There is patchy infarcts along the high left frontal and low left parietal lobes. Well-developed cytotoxic edema without midline shift or herniation. No suspected hemorrhagic conversion; susceptibility artifact at the right splenium of the corpus callosum and at the base of the left temporal parietal infarct is attributed to developmental venous anomalies based on prior head  CTs. No hydrocephalus. No evidence of major vessel occlusion. MRA HEAD FINDINGS Severely limited due to motion, with doubling or distortion of all vessels. This could easily obscure stenosis after occlusion. An aneurysm would likely be missed. Fetal type right PCA.  No other notable anatomic variant. There is symmetric signal within the bilateral MCA branches with no residual narrowing seen at the site of recent left M1 occlusion. IMPRESSION: 1. Acute infarct of roughly 1/3 left MCA territory, most confluent in the inferior territory. No suspected hemorrhagic conversion. 2. Motion degraded study which particularly affects the MRA. The re- opened left M1 segment remains patent. No evidence of proximal stenosis or occlusion. 3. 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 Electronically Signed   By: Monte Fantasia M.D.   On: 07/13/2015 19:08   Dg Chest Port 1 View  07/14/2015  CLINICAL DATA:  Respiratory failure. EXAM: PORTABLE CHEST 1 VIEW COMPARISON:  07/13/2015, May 30, 202016, 01/11/2010. FINDINGS: Interim extubation and removal of NG tube. Mild mediastinal prominence, this may be related AP technique and patient rotation. Follow-up PA and lateral chest x-ray suggested. Cardiomegaly. No pulmonary venous congestion. Low lung volumes with mild bibasilar atelectasis and/or infiltrates. Small left pleural effusion. No pneumothorax. IMPRESSION: 1. Mild mediastinal prominence. This may be  related AP technique and patient rotation. Follow-up PA and lateral chest x-ray suggested to further evaluate. 2. Low lung volumes. Mild bibasilar atelectasis and/or infiltrates. Small left pleural effusion. 3. Stable mild cardiomegaly.  No pulmonary venous congestion . Electronically Signed   By: Marcello Moores  Register   On: 07/14/2015 07:28   Portable Chest Xray  07/12/2015  CLINICAL DATA:  Endotracheal tube placement.  Initial encounter. EXAM: PORTABLE CHEST 1 VIEW COMPARISON:  Chest radiograph performed May 30, 202016 FINDINGS: The patient's endotracheal tube is seen ending 1-2 cm above the carina. This could be retracted 2 cm. Minimal bibasilar atelectasis is noted. Mild vascular congestion is noted. No pleural effusion or pneumothorax is seen. The cardiomediastinal silhouette is borderline normal in size. No acute osseous abnormalities are identified. IMPRESSION: 1. Endotracheal tube seen ending 1-2 cm above the carina. This could be retracted 2 cm. 2. Minimal bibasilar atelectasis noted. Mild vascular congestion seen. Electronically Signed   By: Garald Balding M.D.   On: 07/12/2015 02:51   Dg Abd Portable 1v  07/12/2015  CLINICAL DATA:  Orogastric tube placement.  Initial encounter. EXAM: PORTABLE ABDOMEN - 1 VIEW COMPARISON:  None. FINDINGS: The patient's enteric tube is noted ending overlying the body of the stomach. The side-port is noted about the fundus of the stomach. The visualized bowel gas pattern is unremarkable. Scattered air and stool filled loops of colon are seen; no abnormal dilatation of small bowel loops is seen to suggest small bowel obstruction. No free intra-abdominal air is identified, though evaluation for free air is limited on a single supine view. The visualized osseous structures are within normal limits; the sacroiliac joints are unremarkable in appearance. The visualized lung bases are essentially clear. IMPRESSION: Enteric tube noted ending overlying the body of the stomach.  Electronically Signed   By: Garald Balding M.D.   On: 07/12/2015 23:58   Mr Jodene Nam Head/brain Wo Cm  07/13/2015  CLINICAL DATA:  Right-sided weakness and difficulty speaking EXAM: MRI HEAD WITHOUT CONTRAST MRA HEAD WITHOUT CONTRAST TECHNIQUE: Multiplanar, multiecho pulse sequences of the brain and surrounding structures were obtained without intravenous contrast. Angiographic images of the head were obtained using MRA technique without contrast. COMPARISON:  Multiple recent head CT  FINDINGS: Motion degraded study which could obscure pathology. MRI HEAD FINDINGS Calvarium and upper cervical spine: No focal marrow signal abnormality. Orbits: Negative. Sinuses and Mastoids: Chronic sinusitis with left predominant moderate mucosal thickening. Bilateral mastoid fluid or mucosal thickening with clear nasopharynx. Brain: Restricted diffusion which is most confluent in the left MCA territory inferior division, including the posterior insula, posterior putamen, patchy deep white matter tracts, and lateral left temporal lobe. There is patchy infarcts along the high left frontal and low left parietal lobes. Well-developed cytotoxic edema without midline shift or herniation. No suspected hemorrhagic conversion; susceptibility artifact at the right splenium of the corpus callosum and at the base of the left temporal parietal infarct is attributed to developmental venous anomalies based on prior head CTs. No hydrocephalus. No evidence of major vessel occlusion. MRA HEAD FINDINGS Severely limited due to motion, with doubling or distortion of all vessels. This could easily obscure stenosis after occlusion. An aneurysm would likely be missed. Fetal type right PCA.  No other notable anatomic variant. There is symmetric signal within the bilateral MCA branches with no residual narrowing seen at the site of recent left M1 occlusion. IMPRESSION: 1. Acute infarct of roughly 1/3 left MCA territory, most confluent in the inferior territory.  No suspected hemorrhagic conversion. 2. Motion degraded study which particularly affects the MRA. The re- opened left M1 segment remains patent. No evidence of proximal stenosis or occlusion. 3. 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 Electronically Signed   By: Monte Fantasia M.D.   On: 07/13/2015 19:08   Ir Percutaneous Art Thrombectomy/infusion Intracranial Inc Diag Angio  07/15/2015  INDICATION: Acute onset of right-sided hemiplegia left-sided gaze deviation. Left MCA proximal occlusion by CT angiogram. EXAM: IR ANGIO INTRA EXTRACRAN SEL COM CAROTID INNOMINATE UNI RIGHT MOD SED. BILATERAL COMMON CAROTID ARTERIOGRAMS FOLLOWED BY ENDOVASCULAR COMPLETE REVASCULARIZATION OF OCCLUDED LEFT MIDDLE CEREBRAL ARTERY M1 SEGMENT USING MECHANICAL THROMBECTOMY AND SUPERSELECTIVE INTRACRANIAL INTRA-ARTERIAL 5 MG OF TPA: COMPARISON:  CT angiogram of the 07/11/2015. MEDICATIONS: 2 g Ancef. The antibiotic was administered within 1 hour of the procedure. ANESTHESIA/SEDATION: General anesthesia as per Department of Anesthesiology at [facility]. The patient was continuously monitored during the procedure by the CRNA. CONTRAST:  Omnipaque 300 approximately 70 cc. FLUOROSCOPY TIME:  Fluoroscopy Time: 46 minutes 54 seconds (1644 mGy). COMPLICATIONS: None immediate. TECHNIQUE: Informed written consent was obtained from the patient after a thorough discussion of the procedural risks, benefits and alternatives. All questions were addressed. Maximal Sterile Barrier Technique was utilized including caps, mask, sterile gowns, sterile gloves, sterile drape, hand hygiene and skin antiseptic. A timeout was performed prior to the initiation of the procedure. Following a full explanation of the procedure along with the potential complications, informed consent was obtained from the patient's wife. Risks of intracranial hemorrhage of 10-15%, worsening neurological deficit, ventilator  dependency, death and also inability to revascularize were all discussed in detail. Patient was then put under general anesthesia by the Department of Anesthesiology [facility]. The right groin was prepped and draped in the usual sterile fashion. Thereafter using modified Seldinger technique, transfemoral access into the right common femoral artery was obtained without difficulty. Over a 0.035 inch guidewire, a 5 French Pinnacle sheath was inserted. Through this, and also over a 0.035 inch guidewire a 5 [place] JB 1 catheter was advanced to the aortic arch region and selectively positioned in the left common carotid artery, and at the end of the procedure, the right common  carotid artery. Arteriograms were then performed centered intracranially and extra cranially. FINDINGS: The left common carotid arteriogram demonstrates the left external carotid artery and its major branches to be normally opacified. The left internal carotid artery at the bulb to the cranial skull base appears widely patent. A mild focal stenosis of the junction of the middle 1/3 and the proximal 1/3 of the left internal carotid artery was noted and to be nonflow limiting. The petrous, the cavernous and the supraclinoid segments opacified normally. The left middle cerebral artery was seen to be completely occluded in the mid M1 segment. The left anterior cerebral artery was seen to opacify into the capillary and the venous phases with shift of the watershed perfusion area more posteriorly into the parietal region. Spontaneous opacification via the anterior communicating artery of the right anterior cerebral artery A2 segment was also seen. ENDOVASCULAR COMPLETE REVASCULARIZATION OF THE OCCLUDED LEFT M1 SEGMENT USING MECHANICAL THROMBECTOMY AND 5 MG OF SUPERSELECTIVE INTRACRANIAL INTRA-ARTERIAL TPA: The diagnostic JB 1 catheter in the left common carotid artery was exchanged over a 0.035 inch 300 cm Rosen exchange guidewire for a 55 cm 8  [place] Britetip neurovascular sheath using biplane roadmap technique and constant fluoroscopic guidance. Good aspiration was obtained from the side port of the neurovascular sheath. A gentle contrast injection demonstrated no evidence of spasms, dissections or of intraluminal filling defects. This was then connected to continuous heparinized saline infusion. Over the [employer] guidewire, an 8 [place], 85 cm Flowgate balloon guide catheter which had been prepped and purged with 50% contrast and 50% heparinized saline infusion was then advanced and positioned just proximal to the left common carotid artery bifurcation. The guidewire was removed. Good aspiration was obtained from the hub of the 8 [place] Flowgate guide catheter. A gentle contrast injection demonstrated no evidence of spasms, dissections or of intraluminal filling defects. Over a 0.035 inch Roadrunner guidewire, using biplane roadmap technique and constant fluoroscopic guidance, the 8 [place] Flowgate guide catheter was then advanced into the junction of the middle and the distal one-third of the left internal carotid artery. The guidewire was removed. Although good aspiration was obtained at the hub of the 8 [place] Flowgate guide catheter, a gentle contrast injection revealed contrast distally. The 8 [place] Flowgate guide catheter was then gently retrieved more proximally until there was free antegrade flow. This also unmasked a tight focal severe spasm which resolved with 25 mics of intra-arterial nitroglycerin given through the guide catheter and three separate aliquots. At this time, in a coaxial manner and with constant heparinized saline infusion using biplane roadmap technique and constant fluoroscopic guidance, a combination of a 115 cm 5 [place] Catalyst guide catheter with an 021 Trevo-ProVue microcatheter was then advanced over a 0.014 inch Softip Synchro micro guidewire to the distal end of the 8 [place] Flowgate guide catheter. With the  micro guidewire leading with a J-tip configuration, to avoid dissections or inducing spasm, the combination was then navigated to the supraclinoid left ICA. Thereafter using a torque device to manipulate, the occluded left M1 segment was then entered with the micro guidewire and advanced to the M2-M3 region of the inferior division followed by the microcatheter. The guidewire was removed. Good aspiration was obtained from the hub of the microcatheter. A gentle contrast injection demonstrated slow albeit antegrade flow distally. At this time, 5 mg of superselective intracranial intra-arterial tPA was infused through the 5 [place] Catalyst guide catheter, the tip of which was now in the supraclinoid left ICA. Also  whilst the tPA was infusing, a 4 x 40 mm solitaire FR stent retrieval device which had been prepped and purged with heparinized saline infusion in its housing was then advanced to the distal end of the microcatheter. The O rings on the delivery microcatheter and delivery guidewire on the retrieval device were then loosened. With slight forward gentle traction with the right hand on the delivery microcatheter of the retrieval device, with the left hand, the delivery microcatheter was then retrieved and unsheathed to where the tip of the microcatheter was just proximal to the proximal portion of the retrieval device. This was then left deployed for approximately 3 minutes. At the end of this, a control arteriogram performed through the 5 [place] Navien guide catheter demonstrated partial revascularization of the left middle cerebral artery inferior division with the superior division remaining occluded. Multiple filling defects were seen throughout the intraluminal surface of the retrieval device. At this time, flow arrest was obtained by inflating the balloon of the Flowgate guide catheter. The proximal portion of the retrieval device was then captured into the microcatheter. Using a 60 mL syringe at the hub of  the 8 [place] Flowgate guide catheter for continuous aspiration, the combination of the retrieval device, the microcatheter and the 5 [place] Catalyst guide catheter were then gently retrieved and removed as aspiration was continued. Aspiration was also continued as the flow arrest was stopped by deflating the balloon. The aspirate did not contain any large clots. However bits of clot were seen in the interstices of the retrieval device. A control arteriogram performed through the 8 [place] Flowgate guide catheter demonstrated severe spasm of the mid to distal left internal carotid artery. There continued to be complete occlusion of the superior division of the left middle cerebral artery, and also filling defects noted in the trifurcation region. This prompted a second pass with the retrieval device. To this affect, the microcatheter and the Catalyst guide catheter combination was then advanced as described above using biplane roadmap technique and constant fluoroscopic guidance over a 0.014 inch Softip Synchro micro guidewire. Again access was obtained into the M2-M3 region of the inferior division of the left middle cerebral artery. The guidewire was removed. Again after having confirmed safe positioning of the distal microcatheter, a 4 x 40 mm Solitaire FR stent retrieval device was then advanced and positioned covering the entirety of the abnormal filling defects in the occluded superior division. With flow arrest established in the proximal left internal carotid artery by inflating the balloon, the combination of the retrieval device, the microcatheter and the 5 [place] Catalyst guide catheter was then performed gently and slowly as aspiration with a 60 mL syringe was continued. The aspirate was removed. Chunks of clot were seen within the aspirate. Also seen were little bits of clot in the interstices of the retrieval device. Aspiration was continued whilst the balloon in the proximal left internal carotid artery  was deflated. A control arteriogram performed through the 8 [place] Flowgate guide catheter in the left internal carotid artery demonstrated complete angiographic revascularization of the left middle cerebral artery distribution. There was no change in the anterior cerebral artery on the left. Also seen were at least 1 or 2 very small twigs of the anterior perisylvian branches being cut off with a small focal area of hypoperfusion. Also demonstrated at this time was anterior opacification involving the proximal caval cavernous segment of the left internal carotid artery with a slow delayed opacification of the left cavernous sinus and subsequently  the right cavernous sinus. 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 Arteriograms were then performed at 15 and 30 minutes which continued to demonstrate no change in this fistulous communication. Intracranially no change was noted. No angiographic filling defects or extravasations were seen. No mass-effect was seen either. Throughout the procedure the patient's neurological status and blood pressure remained stable. The 8 [place] Flowgate guide catheter was then retrieved and removed. At this time a 5 [place] JB 1 catheter was then advanced through the 8 [place] neurovascular sheath into the right common carotid artery. An arteriogram performed demonstrated the right external carotid artery and its major branches to be widely patent. The right internal carotid artery at the bulb to the cranial skull base opacified normally. The petrous, cavernous and the supraclinoid segments were seen to opacify normally. A right posterior communicating artery was seen to opacify the right posterior cerebral artery distribution. The right middle cerebral artery was seen to opacify normally into the capillary and the venous phases. A hypoplastic left anterior cerebral artery was also seen to opacify to its tertiary branches and normally into the  capillary and the venous phases. An 8 French neurovascular sheath was then exchanged over a J-tip guidewire for a 9 [place] Pinnacle sheath. This was then connected to continuous heparinized saline infusion. The patient was then transported to the CT scanner for postprocedural CT scan of brain. IMPRESSION: Status post endovascular complete revascularization of the occluded left middle cerebral artery using two passes with the solitaire FR stent retrieval device 40 x 4 mm, and the infusion of 5 mg of superselective intracranial intra-arterial tPA with achievement of a TICI 2b reperfusion. Small hairline twigs involving the anterior perisylvian region may represent chronic ischemia as was seen on the initial CT scan of the brain. Electronically Signed   By: [name] M.D.   On: 07/14/2015 12:51   Ir Angio Intra Extracran Sel Com Carotid Innominate Uni R Mod Sed  07/15/2015  INDICATION: Acute onset of right-sided hemiplegia left-sided gaze deviation. Left MCA proximal occlusion by CT angiogram. EXAM: IR ANGIO INTRA EXTRACRAN SEL COM CAROTID INNOMINATE UNI RIGHT MOD SED. BILATERAL COMMON CAROTID ARTERIOGRAMS FOLLOWED BY ENDOVASCULAR COMPLETE REVASCULARIZATION OF OCCLUDED LEFT MIDDLE CEREBRAL ARTERY M1 SEGMENT USING MECHANICAL THROMBECTOMY AND SUPERSELECTIVE INTRACRANIAL INTRA-ARTERIAL 5 MG OF TPA: COMPARISON:  CT angiogram of the 07/11/2015. MEDICATIONS: 2 g Ancef. The antibiotic was administered within 1 hour of the procedure. ANESTHESIA/SEDATION: General anesthesia as per Department of Anesthesiology at [facility]. The patient was continuously monitored during the procedure by the CRNA. CONTRAST:  Omnipaque 300 approximately 70 cc. FLUOROSCOPY TIME:  Fluoroscopy Time: 46 minutes 54 seconds (1644 mGy). COMPLICATIONS: None immediate. TECHNIQUE: Informed written consent was obtained from the patient after a thorough discussion of the procedural risks, benefits and alternatives. All questions were  addressed. Maximal Sterile Barrier Technique was utilized including caps, mask, sterile gowns, sterile gloves, sterile drape, hand hygiene and skin antiseptic. A timeout was performed prior to the initiation of the procedure. Following a full explanation of the procedure along with the potential complications, informed consent was obtained from the patient's wife. Risks of intracranial hemorrhage of 10-15%, worsening neurological deficit, ventilator dependency, death and also inability to revascularize were all discussed in detail. Patient was then put under general anesthesia by the Department of Anesthesiology [facility]. The right groin was prepped and draped in the usual sterile fashion. Thereafter using modified Seldinger technique, transfemoral access into the  right common femoral artery was obtained without difficulty. Over a 0.035 inch guidewire, a 5 [place] Pinnacle sheath was inserted. Through this, and also over a 0.035 inch guidewire a 5 [place] JB 1 catheter was advanced to the aortic arch region and selectively positioned in the left common carotid artery, and at the end of the procedure, the right common carotid artery. Arteriograms were then performed centered intracranially and extra cranially. FINDINGS: The left common carotid arteriogram demonstrates the left external carotid artery and its major branches to be normally opacified. The left internal carotid artery at the bulb to the cranial skull base appears widely patent. A mild focal stenosis of the junction of the middle 1/3 and the proximal 1/3 of the left internal carotid artery was noted and to be nonflow limiting. The petrous, the cavernous and the supraclinoid segments opacified normally. The left middle cerebral artery was seen to be completely occluded in the mid M1 segment. The left anterior cerebral artery was seen to opacify into the capillary and the venous phases with shift of the watershed perfusion area more posteriorly  into the parietal region. Spontaneous opacification via the anterior communicating artery of the right anterior cerebral artery A2 segment was also seen. ENDOVASCULAR COMPLETE REVASCULARIZATION OF THE OCCLUDED LEFT M1 SEGMENT USING MECHANICAL THROMBECTOMY AND 5 MG OF SUPERSELECTIVE INTRACRANIAL INTRA-ARTERIAL TPA: The diagnostic JB 1 catheter in the left common carotid artery was exchanged over a 0.035 inch 300 cm Rosen exchange guidewire for a 55 cm 8 [place] Britetip neurovascular sheath using biplane roadmap technique and constant fluoroscopic guidance. Good aspiration was obtained from the side port of the neurovascular sheath. A gentle contrast injection demonstrated no evidence of spasms, dissections or of intraluminal filling defects. This was then connected to continuous heparinized saline infusion. Over the [employer] guidewire, an 8 [place], 85 cm Flowgate balloon guide catheter which had been prepped and purged with 50% contrast and 50% heparinized saline infusion was then advanced and positioned just proximal to the left common carotid artery bifurcation. The guidewire was removed. Good aspiration was obtained from the hub of the 8 [place] Flowgate guide catheter. A gentle contrast injection demonstrated no evidence of spasms, dissections or of intraluminal filling defects. Over a 0.035 inch Roadrunner guidewire, using biplane roadmap technique and constant fluoroscopic guidance, the 8 [place] Flowgate guide catheter was then advanced into the junction of the middle and the distal one-third of the left internal carotid artery. The guidewire was removed. Although good aspiration was obtained at the hub of the 8 [place] Flowgate guide catheter, a gentle contrast injection revealed contrast distally. The 8 [place] Flowgate guide catheter was then gently retrieved more proximally until there was free antegrade flow. This also unmasked a tight focal severe spasm which resolved with 25 mics of intra-arterial  nitroglycerin given through the guide catheter and three separate aliquots. At this time, in a coaxial manner and with constant heparinized saline infusion using biplane roadmap technique and constant fluoroscopic guidance, a combination of a 115 cm 5 [place] Catalyst guide catheter with an 021 Trevo-ProVue microcatheter was then advanced over a 0.014 inch Softip Synchro micro guidewire to the distal end of the 8 [place] Flowgate guide catheter. With the micro guidewire leading with a J-tip configuration, to avoid dissections or inducing spasm, the combination was then navigated to the supraclinoid left ICA. Thereafter using a torque device to manipulate, the occluded left M1 segment was then entered with the micro guidewire and advanced to the M2-M3 region of the  inferior division followed by the microcatheter. The guidewire was removed. Good aspiration was obtained from the hub of the microcatheter. A gentle contrast injection demonstrated slow albeit antegrade flow distally. At this time, 5 mg of superselective intracranial intra-arterial tPA was infused through the 5 [place] Catalyst guide catheter, the tip of which was now in the supraclinoid left ICA. Also whilst the tPA was infusing, a 4 x 40 mm solitaire FR stent retrieval device which had been prepped and purged with heparinized saline infusion in its housing was then advanced to the distal end of the microcatheter. The O rings on the delivery microcatheter and delivery guidewire on the retrieval device were then loosened. With slight forward gentle traction with the right hand on the delivery microcatheter of the retrieval device, with the left hand, the delivery microcatheter was then retrieved and unsheathed to where the tip of the microcatheter was just proximal to the proximal portion of the retrieval device. This was then left deployed for approximately 3 minutes. At the end of this, a control arteriogram performed through the 5 [place] Navien guide  catheter demonstrated partial revascularization of the left middle cerebral artery inferior division with the superior division remaining occluded. Multiple filling defects were seen throughout the intraluminal surface of the retrieval device. At this time, flow arrest was obtained by inflating the balloon of the Flowgate guide catheter. The proximal portion of the retrieval device was then captured into the microcatheter. Using a 60 mL syringe at the hub of the 8 [place] Flowgate guide catheter for continuous aspiration, the combination of the retrieval device, the microcatheter and the 5 [place] Catalyst guide catheter were then gently retrieved and removed as aspiration was continued. Aspiration was also continued as the flow arrest was stopped by deflating the balloon. The aspirate did not contain any large clots. However bits of clot were seen in the interstices of the retrieval device. A control arteriogram performed through the 8 [place] Flowgate guide catheter demonstrated severe spasm of the mid to distal left internal carotid artery. There continued to be complete occlusion of the superior division of the left middle cerebral artery, and also filling defects noted in the trifurcation region. This prompted a second pass with the retrieval device. To this affect, the microcatheter and the Catalyst guide catheter combination was then advanced as described above using biplane roadmap technique and constant fluoroscopic guidance over a 0.014 inch Softip Synchro micro guidewire. Again access was obtained into the M2-M3 region of the inferior division of the left middle cerebral artery. The guidewire was removed. Again after having confirmed safe positioning of the distal microcatheter, a 4 x 40 mm Solitaire FR stent retrieval device was then advanced and positioned covering the entirety of the abnormal filling defects in the occluded superior division. With flow arrest established in the proximal left internal  carotid artery by inflating the balloon, the combination of the retrieval device, the microcatheter and the 5 [place] Catalyst guide catheter was then performed gently and slowly as aspiration with a 60 mL syringe was continued. The aspirate was removed. Chunks of clot were seen within the aspirate. Also seen were little bits of clot in the interstices of the retrieval device. Aspiration was continued whilst the balloon in the proximal left internal carotid artery was deflated. A control arteriogram performed through the 8 [place] Flowgate guide catheter in the left internal carotid artery demonstrated complete angiographic revascularization of the left middle cerebral artery distribution. There was no change in the anterior cerebral artery  on the left. Also seen were at least 1 or 2 very small twigs of the anterior perisylvian branches being cut off with a small focal area of hypoperfusion. Also demonstrated at this time was anterior opacification involving the proximal caval cavernous segment of the left internal carotid artery with a slow delayed opacification of the left cavernous sinus and subsequently the right cavernous sinus. 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 Arteriograms were then performed at 15 and 30 minutes which continued to demonstrate no change in this fistulous communication. Intracranially no change was noted. No angiographic filling defects or extravasations were seen. No mass-effect was seen either. Throughout the procedure the patient's neurological status and blood pressure remained stable. The 8 [place] Flowgate guide catheter was then retrieved and removed. At this time a 5 [place] JB 1 catheter was then advanced through the 8 [place] neurovascular sheath into the right common carotid artery. An arteriogram performed demonstrated the right external carotid artery and its major branches to be widely patent. The right internal carotid artery at the  bulb to the cranial skull base opacified normally. The petrous, cavernous and the supraclinoid segments were seen to opacify normally. A right posterior communicating artery was seen to opacify the right posterior cerebral artery distribution. The right middle cerebral artery was seen to opacify normally into the capillary and the venous phases. A hypoplastic left anterior cerebral artery was also seen to opacify to its tertiary branches and normally into the capillary and the venous phases. An 8 [place] neurovascular sheath was then exchanged over a J-tip guidewire for a 9 [place] Pinnacle sheath. This was then connected to continuous heparinized saline infusion. The patient was then transported to the CT scanner for postprocedural CT scan of brain. IMPRESSION: Status post endovascular complete revascularization of the occluded left middle cerebral artery using two passes with the solitaire FR stent retrieval device 40 x 4 mm, and the infusion of 5 mg of superselective intracranial intra-arterial tPA with achievement of a TICI 2b reperfusion. Small hairline twigs involving the anterior perisylvian region may represent chronic ischemia as was seen on the initial CT scan of the brain. Electronically Signed   By: [name] M.D.   On: 07/14/2015 12:51      ASSESSMENT/PLAN: 6M with pAF and HFrEF (EF nadir 35-40% with improvement to 40-45%) who presents with an acute ischemic stroke on 07/11/15 and developed AF with RVR. It is probable that his stroke was AF related. His CHADSVASC is now 4 (CHF, age, CVA) and requires systemic anticoagulation. He has converted to NSR with HR 80-105bpm.   1. Started on Apixaban for anticaoagulation 2. Stop Amio  Due to ILD.  3. Baseline PFTs with DLCO showed reduced corrected DLCO at 43%. He has a remote history of tobacco use.  4. LFTs and TSH are normal. 5. Interstitial lung disease - needs pulmonary followup outpt   Pulmonary feels Amio ok for short term  period (6-8 weeks) but would not recommend long term therapy due to markedly reduced DLCO with ILD.   Recommend D/C Amio with followup with Afib clinic in 1-2 weeks to determine long term therapy ? Tikosyn. We will arrange.  Cannot use flecainide or Rhythmol due to LV dysfunction.  Will sign off call with any questions.  Sueanne Margarita, MD  07/17/2015  11:42 AM

## 2015-07-18 MED ORDER — ENSURE ENLIVE PO LIQD: 237.0000 mL | Freq: Two times a day (BID) | ORAL | Status: DC

## 2015-07-18 MED ORDER — METOPROLOL SUCCINATE ER 25 MG PO TB24: 25.0000 mg | ORAL_TABLET | Freq: Every day | ORAL | Status: DC

## 2015-07-18 MED ORDER — ATORVASTATIN CALCIUM 20 MG PO TABS: 20.0000 mg | ORAL_TABLET | Freq: Every day | ORAL | Status: DC

## 2015-07-18 MED ORDER — APIXABAN 5 MG PO TABS: 5.0000 mg | ORAL_TABLET | Freq: Two times a day (BID) | ORAL | Status: DC

## 2015-07-18 MED ORDER — METOPROLOL SUCCINATE ER 25 MG PO TB24
25.0000 mg | ORAL_TABLET | Freq: Every day | ORAL | Status: DC
  Administered 2015-07-18: 25 mg via ORAL
  Filled 2015-07-18: qty 1

## 2015-07-18 NOTE — Care Management Important Message (Signed)
Important Message  Patient Details  Name: Phillip Solis MRN: [id number] Date of Birth: 1946-01-14   Medicare Important Message Given:  Yes    Lukah Goswami P Zayneb Baucum 07/18/2015, 1:01 PM

## 2015-07-18 NOTE — Progress Notes (Signed)
Speech Language Pathology Treatment: Dysphagia;Cognitive-Linquistic  Patient Details Name: Phillip Solis MRN: [id number] DOB: 1945-11-26 Today's Date: 07/18/2015 Time: 5852-7782 SLP Time Calculation (min) (ACUTE ONLY): 25 min  Assessment / Plan / Recommendation Clinical Impression  Pt demonstrates significant improvement in swallow function with adequate mastication, brisk swallow response, improved oral control, no overt s/s of aspiration.  Pt may advance to regular diet, thin liquids, with no further f/u required for swallowing.  Aphasia reassessment completed using portions of [place] Diagnostic Aphasia Exam.  Pt demonstrates much improved naming to confrontation and responsive naming.  Fluency has improved. Repetition for high-frequency words and sentences has also improved; pt continues to have difficulty repeating low-frequency stimuli (example: "the vat leaks").  Is able to follow 1-2 step commands but poor comprehension of 3+step commands.  Comprehension of yes/no questions and short paragraph information was calculated at 25% accuracy.  Pt was able to state that he would call 911 in an emergency and dialed number on phone independently.    Recommend continued SLP therapy for aphasia.  Pt is adamant about going home.  If he had frequent, intermittent supervision to ensure safety, it may be a viable option.  Will f/u with spouse to discuss language status.     HPI HPI: 70 y.o. male with history of paroxysmal atrial fibrillation and congestive heart failure with ejection fraction 35-40% presented 3/3 with right hemiparesis and speech difficulties; received IV TPA. 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 S/P bilateral common carotid arteriogram,followed by endovascular revascularization of occluded Lt MCA M1 seg.  Intubated 3/3-3/5.       SLP Plan  Continue with current plan of care     Recommendations  Diet  recommendations: Regular;Thin liquid Liquids provided via: Cup Medication Administration: Whole meds with puree Supervision: Patient able to self feed Compensations: Monitor for anterior loss             Oral Care Recommendations: Oral care BID Plan: Continue with current plan of care     GO                Juan Quam Laurice 07/18/2015, 10:51 AM

## 2015-07-18 NOTE — Progress Notes (Signed)
STROKE TEAM PROGRESS NOTE   SUBJECTIVE (INTERVAL HISTORY)  Patient up in chair. States he is doing well and wants to go home. Inpatient rehab was declined and wife agreeable to short term SNF as she works during the day But patient is adamant that he wants to go home. I spoke to the patient's wife over the phone and she said she would come in later this morning and tried to convince the patient.  OBJECTIVE Temp:  [97.9 F (36.6 C)-98.3 F (36.8 C)] 97.9 F (36.6 C) (03/10 1003) Pulse Rate:  [81-106] 106 (03/10 1003) Cardiac Rhythm:  [-] Normal sinus rhythm (03/10 0700) Resp:  [16-20] 16 (03/10 1003) BP: (115-148)/(53-87) 115/87 mmHg (03/10 1003) SpO2:  [97 %-100 %] 99 % (03/10 1003)  CBC:   Recent Labs Lab 07/11/15 2152  07/12/15 0558  07/14/15 0409 07/15/15 0310  WBC 7.1  --  8.3  < > 7.3 5.5  NEUTROABS 2.8  --  6.3  --   --   --   HGB 13.8  < > 12.1*  < > 10.8* 10.0*  HCT 40.4  < > 35.2*  < > 33.4* 31.4*  MCV 90.8  --  88.9  < > 91.5 91.3  PLT 178  --  160  < > 142* 137*  < > = values in this interval not displayed.  Basic Metabolic Panel:   Recent Labs Lab 07/14/15 0409 07/15/15 0310  NA 145 144  K 3.8 3.9  CL 115* 111  CO2 24 24  GLUCOSE 107* 87  BUN 8 9  CREATININE 0.88 0.83  CALCIUM 7.9* 8.2*    Lipid Panel:     Component Value Date/Time   CHOL 182 07/12/2015 0558   TRIG 209* 07/12/2015 0558   TRIG 207* 07/12/2015 0558   HDL 21* 07/12/2015 0558   CHOLHDL 8.7 07/12/2015 0558   VLDL 41* 07/12/2015 0558   LDLCALC 120* 07/12/2015 0558   HgbA1c:  Lab Results  Component Value Date   HGBA1C 5.9* 07/12/2015   Urine Drug Screen:     Component Value Date/Time   LABOPIA NONE DETECTED 07/11/2015 2243   COCAINSCRNUR NONE DETECTED 07/11/2015 2243   LABBENZ NONE DETECTED 07/11/2015 2243   AMPHETMU NONE DETECTED 07/11/2015 2243   THCU NONE DETECTED 07/11/2015 2243   LABBARB NONE DETECTED 07/11/2015 2243      IMAGING  Ct Head Wo Contrast 07/11/2015    1. No acute intracranial pathology seen on CT.  2. Mild cortical volume loss and scattered small vessel ischemic microangiopathy.   CT head 07/11/2015   Dense LEFT MCA and LEFT insular ribbon sign consistent with acute LEFT middle cerebral artery territory infarct.   CTA neck   07/11/2015   Negative.   CTA head 07/11/2015   Emergent distal LEFT M1 occlusion. 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   Cerebral angiogram 07/11/2015 S/P bilateral common carotid arteriogram,followed by endovascular revascularization of occluded Lt MCA M1 seg With x 2 passes with the solitaire FR 86m x 40 mm retrieval device And 5 mg of superselective IA tpa. TICI 2 b reperfusion achieved. Small slow flow Lt CC fistula noted with no compromise of distal ICA flow  Ct Head Wo Contrast 07/12/2015   Faint contrast staining LEFT posterior temporal lobe, with improved visualization gray-white matter junction, no hemorrhage.   MRI and MRA brain 07/13/2015 1. Acute infarct of roughly 1/3 left MCA territory, most confluent in the inferior territory. No suspected hemorrhagic conversion. 2. Motion degraded study  which particularly affects the MRA. The re-opened left M1 segment remains patent. No evidence of proximal stenosis or occlusion. 3. 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   Ct Head Wo Contrast 07/11/2015   1. No acute intracranial pathology seen on CT.  2. Mild cortical volume loss and scattered small vessel ischemic microangiopathy.   Portable Chest Xray 07/12/2015   1. Endotracheal tube seen ending 1-2 cm above the carina. This could be retracted 2 cm. 2. Minimal bibasilar atelectasis noted. Mild vascular congestion seen. Radiology recommendation was to retract 2 cm  07/13/2015 Minimal RIGHT basilar atelectasis.  Tip of endotracheal tube projects 4.4 cm above carina.  07/14/2015 1. Mild mediastinal prominence. This may be related AP technique and  patient rotation. Follow-up PA and lateral chest x-ray suggested to further evaluate. 2. Low lung volumes. Mild bibasilar atelectasis and/or infiltrates. Small left pleural effusion. 3. Stable mild cardiomegaly. No pulmonary venous congestion .  2D Echocardiogram  - Left ventricle: The cavity size was normal. Systolic function wasmildly reduced. The estimated ejection fraction was in the rangeof 45% to 50%. There is akinesis of the basal-midinferolateralmyocardium. Doppler parameters are consistent with abnormal leftventricular relaxation (grade 1 diastolic dysfunction). - Aortic valve: There was trivial regurgitation. - Mitral valve: There was mild regurgitation. Impressions:  EF improved from prior. No cardiac source of emboli wasindentified.   PHYSICAL EXAM Gen: NAD Chest:  CTA                   CV: no MRG, no carotid bruits, no peripheral edema Extrem:  Warm to touch, pulses intact  Neurological Exam:Awake and alert oriented 3. Mild expressive aphasia with word finding difficulties and mild perseveration. Nonfluent speech. Difficulty following 3 step commands. Able to show 2 fingers. Able to name.   Mental Status:  Awake and alert. VFF - could count fingers. R facial weakness.   Motor/Sensory:  mild R hemiparesis mostly hand weakness. No arm drift. Able to raise both legs off bed.   Coordination/Gait:  No UE ataxia. Finger-nose-finger ok.  Gair Deferred during exam.   ASSESSMENT/PLAN Phillip Solis is a 70 y.o. male with history of paroxysmal atrial fibrillation (not anticoagulated), palpitations, and congestive heart failure with ejection fraction 35-40% presenting with right hemiparesis and speech difficulties. He did receive IV TPA at 2230 on Friday, 07/11/2015. Taken to intervention. He received mechanical thrombectomy and IA TPA with TICI2B revascularization. He has done well but has mild residual expressive aphasia. He passed swallow eval and started on anticoagulation  as well as rate control medications for atrial fibrillation.   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 Infarct felt to be embolic secondary to atrial fibrillation.  Resultant right hemiparesis, mild receptive aphasia with global perseveration, dysphagia  MRI  Left MCA infarct one third territory  MRA left M1 segment remains open  CTA of the neck negative  2D Echo  EF 45-50%, no source of embolus  LDL - 120  HgbA1c 5.9  VTE prophylaxis - SCDs Diet regular Room service appropriate?: Yes; Fluid consistency:: Thin  aspirin 81 mg daily prior to admission, started on Eliquis (apixaban) daily   Patient counseled to be compliant with his antithrombotic medications  Ongoing aggressive stroke risk factor management  Therapy recommendations: CIR. 3N1. Rehab admissions coordinator following.   Disposition: CLR(lives at home with wife in [place])   . Await CIR  bed once can tolerate therapies  Respiratory failure, resolved  Intubated for neuro intervention  Atrial Fibrillation with RVR  Initially diagnosed during the endoscopy procedure in 2011 without recurrence  Home anticoagulation:  None Due to low CHA2DS2-VASc score of 0  CHA2DS2-VASc Score = 5, ?2 oral anticoagulation recommended  [age] Years[age]  [age]   [age]1  Sex:  Male   1  Hypertension History:  yes   +1     Diabetes Mellitus:  0   Congestive Heart Failure History:  yes   +1  Vascular Disease History:  0     Stroke/TIA/Thromboembolism History:  yes   +2  Treated with neo drip. No amiodarone, given risk for recurrent stroke with A. Fib to normal sinus rhythm conversion, particularly ANTICOAGULATION. No beta blocker or calcium channel blocker need to prioritize adequate blood pressure to allow cerebral perfusion.  Started on digoxin by cardiology.    Started on Eliquis in the hospital. continue at  Discharge.  Converted to NSR over night - will keep on tele on the floor while awaiting rehab bed.   Hypertension  Blood pressure tends to run mildly low. (Low ejection fraction)  Hyperlipidemia  Home meds: Niaspan prior to admission - not reordered  LDL 120, goal < 70  Add lipitor 20 mg daily  Continue statin at discharge  Other Stroke Risk Factors  Advanced age  Cigarette smoker, quit smoking 42 years ago.  ETOH use  Other Active Problems  Mild anemia  CHF with LV systolic dysfunction diagnosed in 2016. No coronary artery disease. Managed with Entresto and bisoprolol and EF has improved  Hospital day # [place] for Pager information 07/18/2015 11:33 AM  I have personally examined this patient, reviewed notes, independently viewed imaging studies, participated in medical decision making and plan of care. I have made any additions or clarifications directly to the above note.  Plan Dc to snf if patient is agreeable unless his wife agrees to take him home instead. Antony Contras, MD Medical Director [facility] Pager: [phone] 07/18/2015 11:33 AM   To contact Stroke Continuity provider, please refer to [email or web address] After hours, contact General Neurology

## 2015-07-18 NOTE — Progress Notes (Signed)
SUBJECTIVE:  No complaints  OBJECTIVE:   Vitals:   Filed Vitals:   07/17/15 1843 07/17/15 2215 07/18/15 0205 07/18/15 0625  BP: 135/53 148/84 130/82 117/66  Pulse: 88 94 83 81  Temp: 97.9 F (36.6 C) 98.1 F (36.7 C) 98.1 F (36.7 C) 98.3 F (36.8 C)  TempSrc: Oral Oral Oral Oral  Resp: '18 18 18 18  '$ Height:      Weight:      SpO2: 100% 99% 98% 97%   I&O's:   Intake/Output Summary (Last 24 hours) at 07/18/15 0825 Last data filed at 07/18/15 0636  Gross per 24 hour  Intake      0 ml  Output    850 ml  Net   -850 ml   TELEMETRY: Reviewed telemetry pt in NSR:     PHYSICAL EXAM General: Well developed, well nourished, in no acute distress Head: Eyes PERRLA, No xanthomas.   Normal cephalic and atramatic  Lungs:   Clear bilaterally to auscultation and percussion. Heart:   HRRR S1 S2 Pulses are 2+ & equal. Abdomen: Bowel sounds are positive, abdomen soft and non-tender without masses  Extremities:   No clubbing, cyanosis or edema.  DP +1 Neuro: Alert and oriented X 3. Psych:  Good affect, responds appropriately   LABS: Basic Metabolic Panel: No results for input(s): NA, K, CL, CO2, GLUCOSE, BUN, CREATININE, CALCIUM, MG, PHOS in the last 72 hours. Liver Function Tests: No results for input(s): AST, ALT, ALKPHOS, BILITOT, PROT, ALBUMIN in the last 72 hours. No results for input(s): LIPASE, AMYLASE in the last 72 hours. CBC: No results for input(s): WBC, NEUTROABS, HGB, HCT, MCV, PLT in the last 72 hours. Cardiac Enzymes: No results for input(s): CKTOTAL, CKMB, CKMBINDEX, TROPONINI in the last 72 hours. BNP: Invalid input(s): POCBNP D-Dimer: No results for input(s): DDIMER in the last 72 hours. Hemoglobin A1C: No results for input(s): HGBA1C in the last 72 hours. Fasting Lipid Panel: No results for input(s): CHOL, HDL, LDLCALC, TRIG, CHOLHDL, LDLDIRECT in the last 72 hours. Thyroid Function Tests:  Recent Labs  07/15/15 1020  TSH 0.889   Anemia Panel: No  results for input(s): VITAMINB12, FOLATE, FERRITIN, TIBC, IRON, RETICCTPCT in the last 72 hours. Coag Panel:   Lab Results  Component Value Date   INR 0.96 07/11/2015   INR 0.99 07/15/2014    RADIOLOGY: Ct Angio Head W/cm &/or Wo Cm  07/11/2015  CLINICAL DATA:  Code stroke, RIGHT facial droop. Slurred speech and uneven pupils. History of atrial fibrillation. EXAM: CT ANGIOGRAPHY HEAD TECHNIQUE: Multidetector CT imaging of the head was performed using the standard protocol during bolus administration of intravenous contrast. Multiplanar CT image reconstructions and MIPs were obtained to evaluate the vascular anatomy. CONTRAST:  56m OMNIPAQUE IOHEXOL 350 MG/ML SOLN COMPARISON:  CT head July 11, 2015 at 2205 hours FINDINGS: CT HEAD Mildly dense LEFT MCA with LEFT insular ribbon sign. No intraparenchymal hemorrhage, mass effect, midline shift. Ventricles and sulci are overall normal for patient's age, cavum septum pellucidum is a normal variant. Subcentimeter density RIGHT parasagittal parietal lobe suggests developmental venous anomaly. No abnormal extra-axial fluid collections. Basal cisterns are patent. Ocular globes and orbital contents are normal. Paranasal sinuses and mastoid air cells are well aerated. No skull fracture. Multiple prominent vascular lakes at the convexity. Patient is edentulous. CTA NECK Aortic arch: Normal appearance of the thoracic arch, normal branch pattern. Mild calcific atherosclerosis of the aortic arch. The origins of the innominate, left Common carotid artery and  subclavian artery are widely patent. Right carotid system: Common carotid artery is widely patent, coursing in a straight line fashion. Normal appearance of the carotid bifurcation without hemodynamically significant stenosis by NASCET criteria. Normal appearance of the included internal carotid artery. Left carotid system: Common carotid artery is widely patent, coursing in a straight line fashion. Normal appearance of  the carotid bifurcation without hemodynamically significant stenosis by NASCET criteria. Mild eccentric calcific atherosclerosis. Normal appearance of the included internal carotid artery. Vertebral arteries:Left vertebral artery is dominant. Normal appearance of the vertebral arteries, which appear widely patent. Skeleton: No acute osseous process though bone windows have not been submitted. Moderate to severe multilevel degenerative discs resulting in moderate to severe neural foraminal narrowing C3-4, C4-5. Old mild T2 compression fracture with scoliosis. No destructive bony lesions. Other neck: Soft tissues of the neck are nonacute though, not tailored for evaluation. CTA HEAD Anterior circulation: Normal appearance of the cervical internal carotid arteries, petrous, cavernous and supra clinoid internal carotid arteries. Widely patent anterior communicating artery. LEFT A1 is developmentally dominant. Acute distal LEFT M1 segment occlusion. Minimal collateral present on CTA. RIGHT middle cerebral artery vessels are patent. Posterior circulation: Normal appearance of the vertebral arteries, vertebrobasilar junction and basilar artery, as well as main branch vessels. Normal appearance of the posterior cerebral arteries. Fetal origin RIGHT posterior cerebral artery. No hemodynamically significant stenosis, dissection, luminal irregularity, contrast extravasation or aneurysm within the anterior nor posterior circulation. 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 CTA neck:  Negative. CTA head: Emergent distal LEFT M1 occlusion. 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 Acute findings discussed with and reconfirmed by Dr.MCNEILL [facility] on 07/11/2015 at 10:23 pm. Electronically Signed   By: Elon Alas M.D.   On: 07/11/2015 23:00   Dg Chest 1 View  07/13/2015  CLINICAL DATA:  Pulmonary edema, history  atrial fibrillation, palpitations, former smoker, stroke, dilated non ischemic cardiomyopathy EXAM: CHEST 1 VIEW COMPARISON:  Portable exam 0929 hours compared to 07/12/2015 FINDINGS: Tip of endotracheal tube projects 4.4 cm above carina. Nasogastric tube extends into stomach. Normal heart size, mediastinal contours, and pulmonary vascularity. Minimal RIGHT basilar atelectasis. Lungs otherwise clear. No pleural effusion or pneumothorax. Question RIGHT nipple shadow not seen prior chest radiograph. IMPRESSION: Minimal RIGHT basilar atelectasis. Electronically Signed   By: Lavonia Dana M.D.   On: 07/13/2015 09:47   Ct Head Wo Contrast  07/13/2015  CLINICAL DATA:  Follow-up examination for stroke. Status post catheter directed revascularization with intra-arterial tPA. EXAM: CT HEAD WITHOUT CONTRAST TECHNIQUE: Contiguous axial images were obtained from the base of the skull through the vertex without intravenous contrast. COMPARISON:  Multiple previous studies from 07/11/2015 and 07/12/2015. FINDINGS: Age-related cerebral atrophy with chronic small vessel ischemic type changes again noted. There is evolving patchy hypodensity within the left MCA territory involving the left insular region with extension into the left lentiform nucleus, compatible with evolving left MCA territory infarct focal hypodensity within the left temporal region consistent with evolving ischemia. Probable additional patchy hypodensity with subtle loss of gray-white matter differentiation more superficially and posteriorly within the posterior left frontal lobe. No significant mass effect or evidence for hemorrhagic transformation. Previously seen dense left M1 segment no longer visualized. No other acute or evolving large vessel territory infarct. No intracranial hemorrhage. No mass lesion, midline shift, or mass effect. No hydrocephalus. No extra-axial fluid collection. Scalp soft tissues within normal limits. No acute abnormality about the  orbits. Scattered mucosal thickening within the paranasal sinuses. Mastoid air cells grossly clear. Middle ear cavities clear. Calvarium intact. IMPRESSION: 1. Evolving patchy acute left MCA territory ischemic infarcts as above. No significant mass effect or evidence of hemorrhagic transformation. 2. No other new acute intracranial process. 3. 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 Electronically Signed   By: Jeannine Boga M.D.   On: 07/13/2015 01:53   Ct Head Wo Contrast  07/12/2015  CLINICAL DATA:  Follow-up LEFT middle cerebral artery stroke, status post LEFT middle cerebral endovascular revascularization. EXAM: CT HEAD WITHOUT CONTRAST TECHNIQUE: Contiguous axial images were obtained from the base of the skull through the vertex without intravenous contrast. COMPARISON:  CT head July 11, 2015 FINDINGS: Faint contrast staining of LEFT posterior temporal lobe, improved visualization of insula gray-white matter junction. Postcontrast imaging without evidence of hemorrhage, mass effect or midline shift. Ventricles and sulci are normal for patient's age. Subcentimeter enhancement RIGHT parasagittal parietal lobe compatible with developmental venous anomaly. No abnormal extra-axial fluid collections. Minimal calcific atherosclerosis the carotid siphons. Old RIGHT medial orbital blowout fracture. Mild paranasal sinus mucosal thickening, life-support lines in place. Mastoid air cells are well aerated. Prominent vascular lakes at the vertex. IMPRESSION: Faint contrast staining LEFT posterior temporal lobe, with improved visualization gray-white matter junction, no hemorrhage. Electronically Signed   By: Elon Alas M.D.   On: 07/12/2015 02:05   Ct Head Wo Contrast  07/11/2015  CLINICAL DATA:  Code stroke. Right-sided facial droop and slurred speech. Uneven pupils. Initial encounter. EXAM: CT HEAD WITHOUT CONTRAST TECHNIQUE: Contiguous axial images were obtained from the base of the  skull through the vertex without intravenous contrast. COMPARISON:  CT of the head performed 21-Oct-202016 FINDINGS: There is no evidence of acute infarction, mass lesion, or intra- or extra-axial hemorrhage on CT. Prominence of the ventricles and sulci suggests mild cortical volume loss. Scattered subcortical and periventricular white matter change likely reflects small vessel ischemic microangiopathy, relatively stable from the prior study. Mild cerebellar atrophy is noted. The brainstem and fourth ventricle are within normal limits. The basal ganglia are unremarkable in appearance. The cerebral hemispheres demonstrate grossly normal gray-white differentiation. No mass effect or midline shift is seen. There is no evidence of fracture; chronic scattered calvarial lucencies are again noted at the vertex, relatively stable in appearance. The orbits are within normal limits. The paranasal sinuses and mastoid air cells are well-aerated. No significant soft tissue abnormalities are seen. IMPRESSION: 1. No acute intracranial pathology seen on CT. 2. Mild cortical volume loss and scattered small vessel ischemic microangiopathy. These results were called by telephone at the time of interpretation on 07/11/2015 at 10:18 pm to Dr. Leonel Ramsay, who verbally acknowledged these results. Electronically Signed   By: Garald Balding M.D.   On: 07/11/2015 22:21   Ct Angio Neck W/cm &/or Wo/cm  07/11/2015  CLINICAL DATA:  Code stroke, RIGHT facial droop. Slurred speech and uneven pupils. History of atrial fibrillation. EXAM: CT ANGIOGRAPHY HEAD TECHNIQUE: Multidetector CT imaging of the head was performed using the standard protocol during bolus administration of intravenous contrast. Multiplanar CT image reconstructions and MIPs were obtained to evaluate the vascular anatomy. CONTRAST:  71m OMNIPAQUE IOHEXOL 350 MG/ML SOLN COMPARISON:  CT head July 11, 2015 at 2205 hours FINDINGS: CT HEAD Mildly dense LEFT MCA with LEFT insular ribbon  sign. No intraparenchymal hemorrhage, mass effect, midline shift. Ventricles and sulci are overall normal for patient's age, cavum septum pellucidum is a normal variant. Subcentimeter density RIGHT  parasagittal parietal lobe suggests developmental venous anomaly. No abnormal extra-axial fluid collections. Basal cisterns are patent. Ocular globes and orbital contents are normal. Paranasal sinuses and mastoid air cells are well aerated. No skull fracture. Multiple prominent vascular lakes at the convexity. Patient is edentulous. CTA NECK Aortic arch: Normal appearance of the thoracic arch, normal branch pattern. Mild calcific atherosclerosis of the aortic arch. The origins of the innominate, left Common carotid artery and subclavian artery are widely patent. Right carotid system: Common carotid artery is widely patent, coursing in a straight line fashion. Normal appearance of the carotid bifurcation without hemodynamically significant stenosis by NASCET criteria. Normal appearance of the included internal carotid artery. Left carotid system: Common carotid artery is widely patent, coursing in a straight line fashion. Normal appearance of the carotid bifurcation without hemodynamically significant stenosis by NASCET criteria. Mild eccentric calcific atherosclerosis. Normal appearance of the included internal carotid artery. Vertebral arteries:Left vertebral artery is dominant. Normal appearance of the vertebral arteries, which appear widely patent. Skeleton: No acute osseous process though bone windows have not been submitted. Moderate to severe multilevel degenerative discs resulting in moderate to severe neural foraminal narrowing C3-4, C4-5. Old mild T2 compression fracture with scoliosis. No destructive bony lesions. Other neck: Soft tissues of the neck are nonacute though, not tailored for evaluation. CTA HEAD Anterior circulation: Normal appearance of the cervical internal carotid arteries, petrous, cavernous and  supra clinoid internal carotid arteries. Widely patent anterior communicating artery. LEFT A1 is developmentally dominant. Acute distal LEFT M1 segment occlusion. Minimal collateral present on CTA. RIGHT middle cerebral artery vessels are patent. Posterior circulation: Normal appearance of the vertebral arteries, vertebrobasilar junction and basilar artery, as well as main branch vessels. Normal appearance of the posterior cerebral arteries. Fetal origin RIGHT posterior cerebral artery. No hemodynamically significant stenosis, dissection, luminal irregularity, contrast extravasation or aneurysm within the anterior nor posterior circulation. 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 CTA neck:  Negative. CTA head: Emergent distal LEFT M1 occlusion. 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 Acute findings discussed with and reconfirmed by Dr[facility]ILL [facility] on 07/11/2015 at 10:23 pm. Electronically Signed   By: Elon Alas M.D.   On: 07/11/2015 23:00   Mr Brain Wo Contrast  07/13/2015  CLINICAL DATA:  Right-sided weakness and difficulty speaking EXAM: MRI HEAD WITHOUT CONTRAST MRA HEAD WITHOUT CONTRAST TECHNIQUE: Multiplanar, multiecho pulse sequences of the brain and surrounding structures were obtained without intravenous contrast. Angiographic images of the head were obtained using MRA technique without contrast. COMPARISON:  Multiple recent head CT FINDINGS: Motion degraded study which could obscure pathology. MRI HEAD FINDINGS Calvarium and upper cervical spine: No focal marrow signal abnormality. Orbits: Negative. Sinuses and Mastoids: Chronic sinusitis with left predominant moderate mucosal thickening. Bilateral mastoid fluid or mucosal thickening with clear nasopharynx. Brain: Restricted diffusion which is most confluent in the left MCA territory inferior division, including the posterior insula,  posterior putamen, patchy deep white matter tracts, and lateral left temporal lobe. There is patchy infarcts along the high left frontal and low left parietal lobes. Well-developed cytotoxic edema without midline shift or herniation. No suspected hemorrhagic conversion; susceptibility artifact at the right splenium of the corpus callosum and at the base of the left temporal parietal infarct is attributed to developmental venous anomalies based on prior head CTs. No hydrocephalus. No evidence of major vessel occlusion. MRA HEAD FINDINGS Severely limited due to motion, with doubling or distortion of  all vessels. This could easily obscure stenosis after occlusion. An aneurysm would likely be missed. Fetal type right PCA.  No other notable anatomic variant. There is symmetric signal within the bilateral MCA branches with no residual narrowing seen at the site of recent left M1 occlusion. IMPRESSION: 1. Acute infarct of roughly 1/3 left MCA territory, most confluent in the inferior territory. No suspected hemorrhagic conversion. 2. Motion degraded study which particularly affects the MRA. The re- opened left M1 segment remains patent. No evidence of proximal stenosis or occlusion. 3. 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 Electronically Signed   By: Monte Fantasia M.D.   On: 07/13/2015 19:08   Dg Chest Port 1 View  07/14/2015  CLINICAL DATA:  Respiratory failure. EXAM: PORTABLE CHEST 1 VIEW COMPARISON:  07/13/2015, May 19, 202016, 01/11/2010. FINDINGS: Interim extubation and removal of NG tube. Mild mediastinal prominence, this may be related AP technique and patient rotation. Follow-up PA and lateral chest x-ray suggested. Cardiomegaly. No pulmonary venous congestion. Low lung volumes with mild bibasilar atelectasis and/or infiltrates. Small left pleural effusion. No pneumothorax. IMPRESSION: 1. Mild mediastinal prominence. This may be related AP technique and patient  rotation. Follow-up PA and lateral chest x-ray suggested to further evaluate. 2. Low lung volumes. Mild bibasilar atelectasis and/or infiltrates. Small left pleural effusion. 3. Stable mild cardiomegaly.  No pulmonary venous congestion . Electronically Signed   By: Marcello Moores  Register   On: 07/14/2015 07:28   Portable Chest Xray  07/12/2015  CLINICAL DATA:  Endotracheal tube placement.  Initial encounter. EXAM: PORTABLE CHEST 1 VIEW COMPARISON:  Chest radiograph performed May 19, 202016 FINDINGS: The patient's endotracheal tube is seen ending 1-2 cm above the carina. This could be retracted 2 cm. Minimal bibasilar atelectasis is noted. Mild vascular congestion is noted. No pleural effusion or pneumothorax is seen. The cardiomediastinal silhouette is borderline normal in size. No acute osseous abnormalities are identified. IMPRESSION: 1. Endotracheal tube seen ending 1-2 cm above the carina. This could be retracted 2 cm. 2. Minimal bibasilar atelectasis noted. Mild vascular congestion seen. Electronically Signed   By: Garald Balding M.D.   On: 07/12/2015 02:51   Dg Abd Portable 1v  07/12/2015  CLINICAL DATA:  Orogastric tube placement.  Initial encounter. EXAM: PORTABLE ABDOMEN - 1 VIEW COMPARISON:  None. FINDINGS: The patient's enteric tube is noted ending overlying the body of the stomach. The side-port is noted about the fundus of the stomach. The visualized bowel gas pattern is unremarkable. Scattered air and stool filled loops of colon are seen; no abnormal dilatation of small bowel loops is seen to suggest small bowel obstruction. No free intra-abdominal air is identified, though evaluation for free air is limited on a single supine view. The visualized osseous structures are within normal limits; the sacroiliac joints are unremarkable in appearance. The visualized lung bases are essentially clear. IMPRESSION: Enteric tube noted ending overlying the body of the stomach. Electronically Signed   By: Garald Balding  M.D.   On: 07/12/2015 23:58   Mr Jodene Nam Head/brain Wo Cm  07/13/2015  CLINICAL DATA:  Right-sided weakness and difficulty speaking EXAM: MRI HEAD WITHOUT CONTRAST MRA HEAD WITHOUT CONTRAST TECHNIQUE: Multiplanar, multiecho pulse sequences of the brain and surrounding structures were obtained without intravenous contrast. Angiographic images of the head were obtained using MRA technique without contrast. COMPARISON:  Multiple recent head CT FINDINGS: Motion degraded study which could obscure pathology. MRI HEAD FINDINGS Calvarium and upper cervical spine: No focal marrow signal abnormality. Orbits:  Negative. Sinuses and Mastoids: Chronic sinusitis with left predominant moderate mucosal thickening. Bilateral mastoid fluid or mucosal thickening with clear nasopharynx. Brain: Restricted diffusion which is most confluent in the left MCA territory inferior division, including the posterior insula, posterior putamen, patchy deep white matter tracts, and lateral left temporal lobe. There is patchy infarcts along the high left frontal and low left parietal lobes. Well-developed cytotoxic edema without midline shift or herniation. No suspected hemorrhagic conversion; susceptibility artifact at the right splenium of the corpus callosum and at the base of the left temporal parietal infarct is attributed to developmental venous anomalies based on prior head CTs. No hydrocephalus. No evidence of major vessel occlusion. MRA HEAD FINDINGS Severely limited due to motion, with doubling or distortion of all vessels. This could easily obscure stenosis after occlusion. An aneurysm would likely be missed. Fetal type right PCA.  No other notable anatomic variant. There is symmetric signal within the bilateral MCA branches with no residual narrowing seen at the site of recent left M1 occlusion. IMPRESSION: 1. Acute infarct of roughly 1/3 left MCA territory, most confluent in the inferior territory. No suspected hemorrhagic conversion. 2.  Motion degraded study which particularly affects the MRA. The re- opened left M1 segment remains patent. No evidence of proximal stenosis or occlusion. 3. 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 Electronically Signed   By: Monte Fantasia M.D.   On: 07/13/2015 19:08   Ir Percutaneous Art Thrombectomy/infusion Intracranial Inc Diag Angio  07/15/2015  INDICATION: Acute onset of right-sided hemiplegia left-sided gaze deviation. Left MCA proximal occlusion by CT angiogram. EXAM: IR ANGIO INTRA EXTRACRAN SEL COM CAROTID INNOMINATE UNI RIGHT MOD SED. BILATERAL COMMON CAROTID ARTERIOGRAMS FOLLOWED BY ENDOVASCULAR COMPLETE REVASCULARIZATION OF OCCLUDED LEFT MIDDLE CEREBRAL ARTERY M1 SEGMENT USING MECHANICAL THROMBECTOMY AND SUPERSELECTIVE INTRACRANIAL INTRA-ARTERIAL 5 MG OF TPA: COMPARISON:  CT angiogram of the 07/11/2015. MEDICATIONS: 2 g Ancef. The antibiotic was administered within 1 hour of the procedure. ANESTHESIA/SEDATION: General anesthesia as per Department of Anesthesiology at [facility]. The patient was continuously monitored during the procedure by the CRNA. CONTRAST:  Omnipaque 300 approximately 70 cc. FLUOROSCOPY TIME:  Fluoroscopy Time: 46 minutes 54 seconds (1644 mGy). COMPLICATIONS: None immediate. TECHNIQUE: Informed written consent was obtained from the patient after a thorough discussion of the procedural risks, benefits and alternatives. All questions were addressed. Maximal Sterile Barrier Technique was utilized including caps, mask, sterile gowns, sterile gloves, sterile drape, hand hygiene and skin antiseptic. A timeout was performed prior to the initiation of the procedure. Following a full explanation of the procedure along with the potential complications, informed consent was obtained from the patient's wife. Risks of intracranial hemorrhage of 10-15%, worsening neurological deficit, ventilator dependency, death and also inability to  revascularize were all discussed in detail. Patient was then put under general anesthesia by the Department of Anesthesiology [facility]. The right groin was prepped and draped in the usual sterile fashion. Thereafter using modified Seldinger technique, transfemoral access into the right common femoral artery was obtained without difficulty. Over a 0.035 inch guidewire, a 5 French Pinnacle sheath was inserted. Through this, and also over a 0.035 inch guidewire a 5 [place] JB 1 catheter was advanced to the aortic arch region and selectively positioned in the left common carotid artery, and at the end of the procedure, the right common carotid artery. Arteriograms were then performed centered intracranially and extra cranially. FINDINGS: The left common carotid arteriogram demonstrates the left external carotid  artery and its major branches to be normally opacified. The left internal carotid artery at the bulb to the cranial skull base appears widely patent. A mild focal stenosis of the junction of the middle 1/3 and the proximal 1/3 of the left internal carotid artery was noted and to be nonflow limiting. The petrous, the cavernous and the supraclinoid segments opacified normally. The left middle cerebral artery was seen to be completely occluded in the mid M1 segment. The left anterior cerebral artery was seen to opacify into the capillary and the venous phases with shift of the watershed perfusion area more posteriorly into the parietal region. Spontaneous opacification via the anterior communicating artery of the right anterior cerebral artery A2 segment was also seen. ENDOVASCULAR COMPLETE REVASCULARIZATION OF THE OCCLUDED LEFT M1 SEGMENT USING MECHANICAL THROMBECTOMY AND 5 MG OF SUPERSELECTIVE INTRACRANIAL INTRA-ARTERIAL TPA: The diagnostic JB 1 catheter in the left common carotid artery was exchanged over a 0.035 inch 300 cm Rosen exchange guidewire for a 55 cm 8 [place] Britetip neurovascular sheath  using biplane roadmap technique and constant fluoroscopic guidance. Good aspiration was obtained from the side port of the neurovascular sheath. A gentle contrast injection demonstrated no evidence of spasms, dissections or of intraluminal filling defects. This was then connected to continuous heparinized saline infusion. Over the [employer] guidewire, an 8 [place], 85 cm Flowgate balloon guide catheter which had been prepped and purged with 50% contrast and 50% heparinized saline infusion was then advanced and positioned just proximal to the left common carotid artery bifurcation. The guidewire was removed. Good aspiration was obtained from the hub of the 8 [place] Flowgate guide catheter. A gentle contrast injection demonstrated no evidence of spasms, dissections or of intraluminal filling defects. Over a 0.035 inch Roadrunner guidewire, using biplane roadmap technique and constant fluoroscopic guidance, the 8 [place] Flowgate guide catheter was then advanced into the junction of the middle and the distal one-third of the left internal carotid artery. The guidewire was removed. Although good aspiration was obtained at the hub of the 8 [place] Flowgate guide catheter, a gentle contrast injection revealed contrast distally. The 8 [place] Flowgate guide catheter was then gently retrieved more proximally until there was free antegrade flow. This also unmasked a tight focal severe spasm which resolved with 25 mics of intra-arterial nitroglycerin given through the guide catheter and three separate aliquots. At this time, in a coaxial manner and with constant heparinized saline infusion using biplane roadmap technique and constant fluoroscopic guidance, a combination of a 115 cm 5 [place] Catalyst guide catheter with an 021 Trevo-ProVue microcatheter was then advanced over a 0.014 inch Softip Synchro micro guidewire to the distal end of the 8 [place] Flowgate guide catheter. With the micro guidewire leading with a J-tip  configuration, to avoid dissections or inducing spasm, the combination was then navigated to the supraclinoid left ICA. Thereafter using a torque device to manipulate, the occluded left M1 segment was then entered with the micro guidewire and advanced to the M2-M3 region of the inferior division followed by the microcatheter. The guidewire was removed. Good aspiration was obtained from the hub of the microcatheter. A gentle contrast injection demonstrated slow albeit antegrade flow distally. At this time, 5 mg of superselective intracranial intra-arterial tPA was infused through the 5 [place] Catalyst guide catheter, the tip of which was now in the supraclinoid left ICA. Also whilst the tPA was infusing, a 4 x 40 mm solitaire FR stent retrieval device which had been prepped and purged with  heparinized saline infusion in its housing was then advanced to the distal end of the microcatheter. The O rings on the delivery microcatheter and delivery guidewire on the retrieval device were then loosened. With slight forward gentle traction with the right hand on the delivery microcatheter of the retrieval device, with the left hand, the delivery microcatheter was then retrieved and unsheathed to where the tip of the microcatheter was just proximal to the proximal portion of the retrieval device. This was then left deployed for approximately 3 minutes. At the end of this, a control arteriogram performed through the 5 [place] Navien guide catheter demonstrated partial revascularization of the left middle cerebral artery inferior division with the superior division remaining occluded. Multiple filling defects were seen throughout the intraluminal surface of the retrieval device. At this time, flow arrest was obtained by inflating the balloon of the Flowgate guide catheter. The proximal portion of the retrieval device was then captured into the microcatheter. Using a 60 mL syringe at the hub of the 8 [place] Flowgate guide catheter  for continuous aspiration, the combination of the retrieval device, the microcatheter and the 5 [place] Catalyst guide catheter were then gently retrieved and removed as aspiration was continued. Aspiration was also continued as the flow arrest was stopped by deflating the balloon. The aspirate did not contain any large clots. However bits of clot were seen in the interstices of the retrieval device. A control arteriogram performed through the 8 [place] Flowgate guide catheter demonstrated severe spasm of the mid to distal left internal carotid artery. There continued to be complete occlusion of the superior division of the left middle cerebral artery, and also filling defects noted in the trifurcation region. This prompted a second pass with the retrieval device. To this affect, the microcatheter and the Catalyst guide catheter combination was then advanced as described above using biplane roadmap technique and constant fluoroscopic guidance over a 0.014 inch Softip Synchro micro guidewire. Again access was obtained into the M2-M3 region of the inferior division of the left middle cerebral artery. The guidewire was removed. Again after having confirmed safe positioning of the distal microcatheter, a 4 x 40 mm Solitaire FR stent retrieval device was then advanced and positioned covering the entirety of the abnormal filling defects in the occluded superior division. With flow arrest established in the proximal left internal carotid artery by inflating the balloon, the combination of the retrieval device, the microcatheter and the 5 [place] Catalyst guide catheter was then performed gently and slowly as aspiration with a 60 mL syringe was continued. The aspirate was removed. Chunks of clot were seen within the aspirate. Also seen were little bits of clot in the interstices of the retrieval device. Aspiration was continued whilst the balloon in the proximal left internal carotid artery was deflated. A control arteriogram  performed through the 8 [place] Flowgate guide catheter in the left internal carotid artery demonstrated complete angiographic revascularization of the left middle cerebral artery distribution. There was no change in the anterior cerebral artery on the left. Also seen were at least 1 or 2 very small twigs of the anterior perisylvian branches being cut off with a small focal area of hypoperfusion. Also demonstrated at this time was anterior opacification involving the proximal caval cavernous segment of the left internal carotid artery with a slow delayed opacification of the left cavernous sinus and subsequently the right cavernous sinus. 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 Arteriograms were then performed  at 15 and 30 minutes which continued to demonstrate no change in this fistulous communication. Intracranially no change was noted. No angiographic filling defects or extravasations were seen. No mass-effect was seen either. Throughout the procedure the patient's neurological status and blood pressure remained stable. The 8 [place] Flowgate guide catheter was then retrieved and removed. At this time a 5 [place] JB 1 catheter was then advanced through the 8 [place] neurovascular sheath into the right common carotid artery. An arteriogram performed demonstrated the right external carotid artery and its major branches to be widely patent. The right internal carotid artery at the bulb to the cranial skull base opacified normally. The petrous, cavernous and the supraclinoid segments were seen to opacify normally. A right posterior communicating artery was seen to opacify the right posterior cerebral artery distribution. The right middle cerebral artery was seen to opacify normally into the capillary and the venous phases. A hypoplastic left anterior cerebral artery was also seen to opacify to its tertiary branches and normally into the capillary and the venous phases. An 8  French neurovascular sheath was then exchanged over a J-tip guidewire for a 9 [place] Pinnacle sheath. This was then connected to continuous heparinized saline infusion. The patient was then transported to the CT scanner for postprocedural CT scan of brain. IMPRESSION: Status post endovascular complete revascularization of the occluded left middle cerebral artery using two passes with the solitaire FR stent retrieval device 40 x 4 mm, and the infusion of 5 mg of superselective intracranial intra-arterial tPA with achievement of a TICI 2b reperfusion. Small hairline twigs involving the anterior perisylvian region may represent chronic ischemia as was seen on the initial CT scan of the brain. Electronically Signed   By: [name] M.D.   On: 07/14/2015 12:51   Ir Angio Intra Extracran Sel Com Carotid Innominate Uni R Mod Sed  07/15/2015  INDICATION: Acute onset of right-sided hemiplegia left-sided gaze deviation. Left MCA proximal occlusion by CT angiogram. EXAM: IR ANGIO INTRA EXTRACRAN SEL COM CAROTID INNOMINATE UNI RIGHT MOD SED. BILATERAL COMMON CAROTID ARTERIOGRAMS FOLLOWED BY ENDOVASCULAR COMPLETE REVASCULARIZATION OF OCCLUDED LEFT MIDDLE CEREBRAL ARTERY M1 SEGMENT USING MECHANICAL THROMBECTOMY AND SUPERSELECTIVE INTRACRANIAL INTRA-ARTERIAL 5 MG OF TPA: COMPARISON:  CT angiogram of the 07/11/2015. MEDICATIONS: 2 g Ancef. The antibiotic was administered within 1 hour of the procedure. ANESTHESIA/SEDATION: General anesthesia as per Department of Anesthesiology at [facility]. The patient was continuously monitored during the procedure by the CRNA. CONTRAST:  Omnipaque 300 approximately 70 cc. FLUOROSCOPY TIME:  Fluoroscopy Time: 46 minutes 54 seconds (1644 mGy). COMPLICATIONS: None immediate. TECHNIQUE: Informed written consent was obtained from the patient after a thorough discussion of the procedural risks, benefits and alternatives. All questions were addressed. Maximal Sterile Barrier  Technique was utilized including caps, mask, sterile gowns, sterile gloves, sterile drape, hand hygiene and skin antiseptic. A timeout was performed prior to the initiation of the procedure. Following a full explanation of the procedure along with the potential complications, informed consent was obtained from the patient's wife. Risks of intracranial hemorrhage of 10-15%, worsening neurological deficit, ventilator dependency, death and also inability to revascularize were all discussed in detail. Patient was then put under general anesthesia by the Department of Anesthesiology [facility]. The right groin was prepped and draped in the usual sterile fashion. Thereafter using modified Seldinger technique, transfemoral access into the right common femoral artery was obtained without difficulty. Over a 0.035 inch guidewire, a 5 [place] Pinnacle sheath was inserted. Through this,  and also over a 0.035 inch guidewire a 5 [place] JB 1 catheter was advanced to the aortic arch region and selectively positioned in the left common carotid artery, and at the end of the procedure, the right common carotid artery. Arteriograms were then performed centered intracranially and extra cranially. FINDINGS: The left common carotid arteriogram demonstrates the left external carotid artery and its major branches to be normally opacified. The left internal carotid artery at the bulb to the cranial skull base appears widely patent. A mild focal stenosis of the junction of the middle 1/3 and the proximal 1/3 of the left internal carotid artery was noted and to be nonflow limiting. The petrous, the cavernous and the supraclinoid segments opacified normally. The left middle cerebral artery was seen to be completely occluded in the mid M1 segment. The left anterior cerebral artery was seen to opacify into the capillary and the venous phases with shift of the watershed perfusion area more posteriorly into the parietal region. Spontaneous  opacification via the anterior communicating artery of the right anterior cerebral artery A2 segment was also seen. ENDOVASCULAR COMPLETE REVASCULARIZATION OF THE OCCLUDED LEFT M1 SEGMENT USING MECHANICAL THROMBECTOMY AND 5 MG OF SUPERSELECTIVE INTRACRANIAL INTRA-ARTERIAL TPA: The diagnostic JB 1 catheter in the left common carotid artery was exchanged over a 0.035 inch 300 cm Rosen exchange guidewire for a 55 cm 8 [place] Britetip neurovascular sheath using biplane roadmap technique and constant fluoroscopic guidance. Good aspiration was obtained from the side port of the neurovascular sheath. A gentle contrast injection demonstrated no evidence of spasms, dissections or of intraluminal filling defects. This was then connected to continuous heparinized saline infusion. Over the [employer] guidewire, an 8 [place], 85 cm Flowgate balloon guide catheter which had been prepped and purged with 50% contrast and 50% heparinized saline infusion was then advanced and positioned just proximal to the left common carotid artery bifurcation. The guidewire was removed. Good aspiration was obtained from the hub of the 8 [place] Flowgate guide catheter. A gentle contrast injection demonstrated no evidence of spasms, dissections or of intraluminal filling defects. Over a 0.035 inch Roadrunner guidewire, using biplane roadmap technique and constant fluoroscopic guidance, the 8 [place] Flowgate guide catheter was then advanced into the junction of the middle and the distal one-third of the left internal carotid artery. The guidewire was removed. Although good aspiration was obtained at the hub of the 8 [place] Flowgate guide catheter, a gentle contrast injection revealed contrast distally. The 8 [place] Flowgate guide catheter was then gently retrieved more proximally until there was free antegrade flow. This also unmasked a tight focal severe spasm which resolved with 25 mics of intra-arterial nitroglycerin given through the guide  catheter and three separate aliquots. At this time, in a coaxial manner and with constant heparinized saline infusion using biplane roadmap technique and constant fluoroscopic guidance, a combination of a 115 cm 5 [place] Catalyst guide catheter with an 021 Trevo-ProVue microcatheter was then advanced over a 0.014 inch Softip Synchro micro guidewire to the distal end of the 8 [place] Flowgate guide catheter. With the micro guidewire leading with a J-tip configuration, to avoid dissections or inducing spasm, the combination was then navigated to the supraclinoid left ICA. Thereafter using a torque device to manipulate, the occluded left M1 segment was then entered with the micro guidewire and advanced to the M2-M3 region of the inferior division followed by the microcatheter. The guidewire was removed. Good aspiration was obtained from the hub of the microcatheter. A gentle  contrast injection demonstrated slow albeit antegrade flow distally. At this time, 5 mg of superselective intracranial intra-arterial tPA was infused through the 5 [place] Catalyst guide catheter, the tip of which was now in the supraclinoid left ICA. Also whilst the tPA was infusing, a 4 x 40 mm solitaire FR stent retrieval device which had been prepped and purged with heparinized saline infusion in its housing was then advanced to the distal end of the microcatheter. The O rings on the delivery microcatheter and delivery guidewire on the retrieval device were then loosened. With slight forward gentle traction with the right hand on the delivery microcatheter of the retrieval device, with the left hand, the delivery microcatheter was then retrieved and unsheathed to where the tip of the microcatheter was just proximal to the proximal portion of the retrieval device. This was then left deployed for approximately 3 minutes. At the end of this, a control arteriogram performed through the 5 [place] Navien guide catheter demonstrated partial  revascularization of the left middle cerebral artery inferior division with the superior division remaining occluded. Multiple filling defects were seen throughout the intraluminal surface of the retrieval device. At this time, flow arrest was obtained by inflating the balloon of the Flowgate guide catheter. The proximal portion of the retrieval device was then captured into the microcatheter. Using a 60 mL syringe at the hub of the 8 [place] Flowgate guide catheter for continuous aspiration, the combination of the retrieval device, the microcatheter and the 5 [place] Catalyst guide catheter were then gently retrieved and removed as aspiration was continued. Aspiration was also continued as the flow arrest was stopped by deflating the balloon. The aspirate did not contain any large clots. However bits of clot were seen in the interstices of the retrieval device. A control arteriogram performed through the 8 [place] Flowgate guide catheter demonstrated severe spasm of the mid to distal left internal carotid artery. There continued to be complete occlusion of the superior division of the left middle cerebral artery, and also filling defects noted in the trifurcation region. This prompted a second pass with the retrieval device. To this affect, the microcatheter and the Catalyst guide catheter combination was then advanced as described above using biplane roadmap technique and constant fluoroscopic guidance over a 0.014 inch Softip Synchro micro guidewire. Again access was obtained into the M2-M3 region of the inferior division of the left middle cerebral artery. The guidewire was removed. Again after having confirmed safe positioning of the distal microcatheter, a 4 x 40 mm Solitaire FR stent retrieval device was then advanced and positioned covering the entirety of the abnormal filling defects in the occluded superior division. With flow arrest established in the proximal left internal carotid artery by inflating the  balloon, the combination of the retrieval device, the microcatheter and the 5 [place] Catalyst guide catheter was then performed gently and slowly as aspiration with a 60 mL syringe was continued. The aspirate was removed. Chunks of clot were seen within the aspirate. Also seen were little bits of clot in the interstices of the retrieval device. Aspiration was continued whilst the balloon in the proximal left internal carotid artery was deflated. A control arteriogram performed through the 8 [place] Flowgate guide catheter in the left internal carotid artery demonstrated complete angiographic revascularization of the left middle cerebral artery distribution. There was no change in the anterior cerebral artery on the left. Also seen were at least 1 or 2 very small twigs of the anterior perisylvian branches being cut off  with a small focal area of hypoperfusion. Also demonstrated at this time was anterior opacification involving the proximal caval cavernous segment of the left internal carotid artery with a slow delayed opacification of the left cavernous sinus and subsequently the right cavernous sinus. 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 Arteriograms were then performed at 15 and 30 minutes which continued to demonstrate no change in this fistulous communication. Intracranially no change was noted. No angiographic filling defects or extravasations were seen. No mass-effect was seen either. Throughout the procedure the patient's neurological status and blood pressure remained stable. The 8 [place] Flowgate guide catheter was then retrieved and removed. At this time a 5 [place] JB 1 catheter was then advanced through the 8 [place] neurovascular sheath into the right common carotid artery. An arteriogram performed demonstrated the right external carotid artery and its major branches to be widely patent. The right internal carotid artery at the bulb to the cranial skull base  opacified normally. The petrous, cavernous and the supraclinoid segments were seen to opacify normally. A right posterior communicating artery was seen to opacify the right posterior cerebral artery distribution. The right middle cerebral artery was seen to opacify normally into the capillary and the venous phases. A hypoplastic left anterior cerebral artery was also seen to opacify to its tertiary branches and normally into the capillary and the venous phases. An 8 [place] neurovascular sheath was then exchanged over a J-tip guidewire for a 9 [place] Pinnacle sheath. This was then connected to continuous heparinized saline infusion. The patient was then transported to the CT scanner for postprocedural CT scan of brain. IMPRESSION: Status post endovascular complete revascularization of the occluded left middle cerebral artery using two passes with the solitaire FR stent retrieval device 40 x 4 mm, and the infusion of 5 mg of superselective intracranial intra-arterial tPA with achievement of a TICI 2b reperfusion. Small hairline twigs involving the anterior perisylvian region may represent chronic ischemia as was seen on the initial CT scan of the brain. Electronically Signed   By: [name] M.D.   On: 07/14/2015 12:51    ASSESSMENT/PLAN: 27M with pAF and HFrEF (EF nadir 35-40% with improvement to 40-45%) who presents with an acute ischemic stroke on 07/11/15 and developed AF with RVR. It is probable that his stroke was AF related. His CHADSVASC is now 4 (CHF, age, CVA) and requires systemic anticoagulation. He has converted to NSR with HR 80-105bpm.   1. Started on Apixaban for anticaoagulation  2. Stopped Amio Due to ILD. Pulmonary feels Amio ok for short term period (6-8 weeks) but would not recommend long term therapy due to markedly reduced DLCO with ILD. Recommend  followup with EP  in 1-2 weeks to determine long term therapy ? Tikosyn. We will arrange. Cannot use flecainide or Rhythmol  due to LV dysfunction.  Add Toprol '25mg'$  daily for suppression of PAF until seen by EP  3. Baseline PFTs with DLCO showed reduced corrected DLCO at 43%. He has a remote history of tobacco use.   4. Interstitial lung disease - needs pulmonary followup outpt  5.  Add Toprol '25mg'$  daily for suppression of PAF until seen by EP  Pulmonary feels Amio ok for short term period (6-8 weeks) but would not recommend long term therapy due to markedly reduced DLCO with ILD. Recommend D/C Amio with followup with Afib clinic in 1-2 weeks to determine long term therapy ? Tikosyn. We will arrange. Cannot use  flecainide or Rhythmol due to LV dysfunction.     Sueanne Margarita, MD  07/18/2015  8:25 AM

## 2015-07-18 NOTE — Progress Notes (Addendum)
Discharge orders received.  Discharge instructions and follow-up appointments reviewed with the patient and his family.  VSS upon discharge.  IV removed and education complete.  All belongings sent with the patient.  Transported out via wheelchair. Cori Razor, RN

## 2015-07-18 NOTE — Clinical Social Work Note (Signed)
CSW was informed by case manager, that patient will be discharging home with home health.  CSW to sign off please reconsult if other social work needs arise.  Jones Broom. [place], MSW, [place] 07/18/2015 4:11 PM

## 2015-07-18 NOTE — Discharge Summary (Addendum)
Stroke Discharge Summary  Patient ID: Phillip Solis   MRN: [id number]      DOB: Aug 26, 1945  Date of Admission: 07/11/2015 Date of Discharge: 07/18/2015  Attending Physician:  Garvin Fila, MD, Stroke MD Consulting Physician(s):   Luz Brazen, MD (pulmonary/intensive care), Lamar Sprinkles, MD (cardiology), and Delice Lesch, MD (Physical Medicine & Rehabtilitation)  Patient's PCP:  Teressa Lower, MD  DISCHARGE DIAGNOSIS:  1. 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 2. Right hemiparesis, mild receptive aphasia with global perseveration, dysphagia secondary to stroke 3. Postoperative respiratory failure, resolved 4. Atrial Fibrillation with RVR 5. Hypertension 6. Hyperlipidemia 7. Mild anemia 8. CHF with LV systolic dysfunction  9. Interstitial lung disease    Past Medical History  Diagnosis Date  . History of atrial fibrillation     in setting of endoscopy in 2011; resolved  . History of palpitations   . Atrial fibrillation with rapid ventricular response ([place]) 07/14/2015   Past Surgical History  Procedure Laterality Date  . Left and right heart catheterization with coronary angiogram N/A 07/23/2014    Procedure: LEFT AND RIGHT HEART CATHETERIZATION WITH CORONARY ANGIOGRAM;  Surgeon: [name], MD;  Location: [facility] CATH LAB;  Service: Cardiovascular;  Laterality: N/A;  . Radiology with anesthesia N/A 07/11/2015    Procedure: RADIOLOGY WITH ANESTHESIA;  Surgeon: [name], MD;  Location: [place];  Service: Radiology;  Laterality: N/A;      Medication List    ASK your doctor about these medications        aspirin EC 81 MG tablet  Take 1 tablet (81 mg total) by mouth daily.     bisoprolol 5 MG tablet  Commonly known as:  ZEBETA  Take 1 tablet (5 mg total) by mouth 2 (two) times daily.     multivitamin with minerals Tabs tablet  Take  1 tablet by mouth daily.     NIASPAN PO  Take 1 tablet by mouth daily.        LABORATORY STUDIES CBC    Component Value Date/Time   WBC 5.5 07/15/2015 0310   RBC 3.44* 07/15/2015 0310   HGB 10.0* 07/15/2015 0310   HCT 31.4* 07/15/2015 0310   PLT 137* 07/15/2015 0310   MCV 91.3 07/15/2015 0310   MCH 29.1 07/15/2015 0310   MCHC 31.8 07/15/2015 0310   RDW 14.2 07/15/2015 0310   LYMPHSABS 1.5 07/12/2015 0558   MONOABS 0.3 07/12/2015 0558   EOSABS 0.1 07/12/2015 0558   BASOSABS 0.0 07/12/2015 0558   CMP    Component Value Date/Time   NA 144 07/15/2015 0310   K 3.9 07/15/2015 0310   CL 111 07/15/2015 0310   CO2 24 07/15/2015 0310   GLUCOSE 87 07/15/2015 0310   BUN 9 07/15/2015 0310   CREATININE 0.83 07/15/2015 0310   CREATININE 0.87 07/15/2014 1041   CALCIUM 8.2* 07/15/2015 0310   PROT 5.1* 07/13/2015 0826   ALBUMIN 2.7* 07/13/2015 0826   AST 39 07/13/2015 0826   ALT 19 07/13/2015 0826   ALKPHOS 57 07/13/2015 0826   BILITOT 1.0 07/13/2015 0826   GFRNONAA >60 07/15/2015 0310   GFRAA >60 07/15/2015 0310   COAGS Lab Results  Component Value Date   INR 0.96 07/11/2015   INR 0.99 07/15/2014   Lipid Panel    Component Value Date/Time   CHOL 182 07/12/2015 0558   TRIG 209* 07/12/2015  0558   TRIG 207* 07/12/2015 0558   HDL 21* 07/12/2015 0558   CHOLHDL 8.7 07/12/2015 0558   VLDL 41* 07/12/2015 0558   LDLCALC 120* 07/12/2015 0558   HgbA1C  Lab Results  Component Value Date   HGBA1C 5.9* 07/12/2015   Cardiac Panel (last 3 results) No results for input(s): CKTOTAL, CKMB, TROPONINI, RELINDX in the last 72 hours. Urinalysis    Component Value Date/Time   COLORURINE YELLOW 07/11/2015 2243   APPEARANCEUR CLEAR 07/11/2015 2243   LABSPEC 1.024 07/11/2015 2243   PHURINE 5.5 07/11/2015 2243   GLUCOSEU NEGATIVE 07/11/2015 2243   HGBUR NEGATIVE 07/11/2015 2243   BILIRUBINUR NEGATIVE 07/11/2015 2243   KETONESUR NEGATIVE 07/11/2015 2243   PROTEINUR NEGATIVE  07/11/2015 2243   NITRITE NEGATIVE 07/11/2015 2243   LEUKOCYTESUR NEGATIVE 07/11/2015 2243   Urine Drug Screen     Component Value Date/Time   LABOPIA NONE DETECTED 07/11/2015 2243   COCAINSCRNUR NONE DETECTED 07/11/2015 2243   LABBENZ NONE DETECTED 07/11/2015 2243   AMPHETMU NONE DETECTED 07/11/2015 2243   THCU NONE DETECTED 07/11/2015 2243   LABBARB NONE DETECTED 07/11/2015 2243    Alcohol Level    Component Value Date/Time   ETH <5 07/11/2015 2151     SIGNIFICANT DIAGNOSTIC STUDIES Ct Head Wo Contrast 07/12/2015  Faint contrast staining LEFT posterior temporal lobe, with improved visualization gray-white matter junction, no hemorrhage.   07/11/2015  Dense LEFT MCA and LEFT insular ribbon sign consistent with acute LEFT middle cerebral artery territory infarct.   07/11/2015  1. No acute intracranial pathology seen on CT. 2. Mild cortical volume loss and scattered small vessel ischemic microangiopathy.   CTA neck  07/11/2015  Negative.   CTA head 07/11/2015  Emergent distal LEFT M1 occlusion. 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   Cerebral angiogram 07/11/2015 S/P bilateral common carotid arteriogram,followed by endovascular revascularization of occluded Lt MCA M1 seg With x 2 passes with the solitaire FR 5m x 40 mm retrieval device And 5 mg of superselective IA tpa. TICI 2 b reperfusion achieved. Small slow flow Lt CC fistula noted with no compromise of distal ICA flow  MRI and MRA brain 07/13/2015 1. Acute infarct of roughly 1/3 left MCA territory, most confluent in the inferior territory. No suspected hemorrhagic conversion. 2. Motion degraded study which particularly affects the MRA. The re-opened left M1 segment remains patent. No evidence of proximal stenosis or occlusion. 3. 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  Portable Chest Xray 07/14/2015 1. Mild mediastinal prominence. This may be  related AP technique and patient rotation. Follow-up PA and lateral chest x-ray suggested to further evaluate. 2. Low lung volumes. Mild bibasilar atelectasis and/or infiltrates. Small left pleural effusion. 3. Stable mild cardiomegaly. No pulmonary venous congestion .  07/13/2015 Minimal RIGHT basilar atelectasis. Tip of endotracheal tube projects 4.4 cm above carina.  07/12/2015  1. Endotracheal tube seen ending 1-2 cm above the carina. This could be retracted 2 cm. 2. Minimal bibasilar atelectasis noted. Mild vascular congestion seen. Radiology recommendation was to retract 2 cm  2D Echocardiogram  - Left ventricle: The cavity size was normal. Systolic function wasmildly reduced. The estimated ejection fraction was in the rangeof 45% to 50%. There is akinesis of the basal-midinferolateralmyocardium. Doppler parameters are consistent with abnormal leftventricular relaxation (grade 1 diastolic dysfunction). - Aortic valve: There was trivial regurgitation. - Mitral valve: There was mild regurgitation. Impressions:  EF improved from prior. No cardiac source of emboli wasindentified.  HISTORY OF PRESENT ILLNESS  Phillip Solis is a 70 y.o. male who was in his normal state of health when his wife was getting home around 9 PM on 07/11/2015 (last known well). She she had a goodwent to lay down, and then heard a thump. She went to check on him and found him with right-sided weakness and difficulty speaking. EMS was called and code stroke was activated. Following head CT with no signs of hemorrhage, CT angiogram was performed given the signs of a likely large vessel ischemic stroke. IV TPA was initiated at 2230 on Friday, 07/11/2015 and interventional radiology was notified. He received mechanical thrombectomy and IA TPA with TICI2B revascularization.   As recently diagnosed with CHF, with an EF 35-40%. He also has a history of atrial fibrillation a single time after an endoscopy with no  recurrence. Given a low chads-Vasc score, he was not started on anticoagulation. Though his recent diagnosis of CHF did raise it to 1, given that he did not have any recurrence in the interim since 2011 and it was only in the setting of a procedure he was not started on anticoagulation.  NIH: 21. Premorbid modified Rankin: Phillip Solis is a 70 y.o. male with history of paroxysmal atrial fibrillation (not anticoagulated), palpitations, and congestive heart failure with ejection fraction 35-40% presenting with right hemiparesis and speech difficulties. He did receive IV TPA and was taken to interventional radiology where he received mechanical thrombectomy and IA TPA to L MCA with TICI2B revascularization. He had a short stay in the ICU post revascularization. His infarct was embolic secondary to known paroxysmal atrial fibrillation. He has done well but has mild residual expressive aphasia. He passed swallow eval and started on anticoagulation as well as rate control medications for atrial fibrillation.   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 Infarct felt to be embolic secondary to atrial fibrillation.  Resultant right hemiparesis, mild receptive aphasia with global perseveration, dysphagia  MRI Left MCA infarct one third territory  MRA left M1 segment remains open  CTA of the neck negative  2D Echo EF 45-50%, no source of embolus  LDL - 120  HgbA1c 5.9  aspirin 81 mg daily prior to admission, started on Eliquis (apixaban) daily in hospital  Patient counseled to be compliant with his antithrombotic medications  Ongoing aggressive stroke risk factor management  Therapy recommendations: CIR. 3N1. Rehab admissions was denied by insurance company. SNF was recommended, however patient adamantly wanted to go home. Wife, who had recently been sick,  herself, finally agreed.  Disposition: home with wife in [place], OT, OT, RN and social worker  Postoperative respiratory failure, resolved  Intubated for neuro intervention  Atrial Fibrillation with RVR  Initially diagnosed during the endoscopy procedure in 2011 without recurrence  Home anticoagulation: None Due to low CHA2DS2-VASc score of 0  CHA2DS2-VASc Score = 5, ?2 oral anticoagulation recommended Age in Years: 46-74 +1 Sex: Male 1 Hypertension History: yes +1   Diabetes Mellitus: 0  Congestive Heart Failure History: yes +1 Vascular Disease History: 0  Stroke/TIA/Thromboembolism History: yes +2  Started on Eliquis in the hospital. continue at Discharge.  Converted to NSR in the hospital  Cardiology consult - initially treated with neo drip. No amiodarone, given risk for recurrent stroke with A. Fib to normal sinus rhythm conversion. No beta blocker or calcium channel  blocker need to prioritize adequate blood pressure to allow cerebral perfusion.  Started on digoxin by cardiology then changed  to Toprol '25mg'$  (Pulmonary feels amio ok for short term period (6-8 weeks) but would not recommend long term therapy due to markedly reduced DLCO with ILD.) Cannot use flecainide or Rhythmol due to LV dysfunction. daily for suppression of PAF until seen by EP  Recommend followup with EP in 1-2 weeks to determine long term therapy ? Tikosyn. Cardiology to arrange.   Hypertension  Blood pressure tends to run mildly low. (Low ejection fraction)  Hyperlipidemia  Home meds: Niaspan prior to admission - not reordered  LDL 120, goal < 70  Add lipitor 20 mg daily  Continue statin at discharge  Other Stroke Risk Factors  Advanced age  Former cigarette smoker, quit smoking 42 years ago.  ETOH use  Other Active Problems  Mild  anemia  CHF with LV systolic dysfunction diagnosed in 2016. No coronary artery disease. Managed with Entresto and bisoprolol and EF has improved  Interstitial lung disease  - Baseline PFTs with DLCO showed reduced corrected DLCO at 43%. needs pulmonary followup outpt.    DISCHARGE EXAM Blood pressure 117/66, pulse 81, temperature 98.3 F (36.8 C), temperature source Oral, resp. rate 18, height 6' (1.829 m), weight 75.3 kg (166 lb 0.1 oz), SpO2 97 %. Gen: NAD Chest: CTA  CV: no MRG, no carotid bruits, no peripheral edema Extrem: Warm to touch, pulses intact Neurological Exam:Awake and alert oriented 3. Mild expressive aphasia with word finding difficulties and mild perseveration. Nonfluent speech. Difficulty following 3 step commands. Able to show 2 fingers. Able to name.  Mental Status: Awake and alert. VFF - could count fingers. R facial weakness.  Motor/Sensory: mild R hemiparesis mostly hand weakness. No arm drift. Able to raise both legs off bed.  Coordination/Gait: No UE ataxia. Finger-nose-finger ok. Gair Deferred during exam.  Discharge Diet   DIET DYS 3 Room service appropriate?: Yes; Fluid consistency:: Thin liquids  DISCHARGE PLAN  Disposition:  Home with wife  Home health PT, OT , RN and SW   Eliquis (apixaban) daily for secondary stroke prevention.  Follow-up DOUGH,ROBERT, MD in 2 weeks for hospital follow up and to coordinate post hospitalization needs  Follow-up with Dr. Antony Contras, Stroke Clinic in 2 months.  Follow-up with cardiology, they are to arrange  Follow-up with pulmonary  45 minutes were spent preparing discharge.  [place] Pillager for Pager information 07/21/2015 3:55 PM  I have personally examined this patient, reviewed notes, independently viewed imaging studies, participated in medical decision making and plan of care. I have made any additions or clarifications directly to the above  note. Agree with note above.   Antony Contras, MD Medical Director [place] Pager: [phone] 07/23/2015 5:32 PM

## 2015-07-18 NOTE — Care Management Note (Addendum)
Case Management Note  Patient Details  Name: DOMONIK LEVARIO MRN: [id number] Date of Birth: [birth year]  Subjective/Objective:                    Action/Plan: Patient and wife are in agreement that patient be discharged home with [facility] services. CM met with the patient and his family and the patients daughter is available to stay with the patient during the day while his wife works. CM provided them a list of agencies in the [facility] area. They selected [facility]. Abby with [facility] notified and looking into the referral. Patient also discharging on Eliquis. Benefits check done revealed a cost of $47/ month. CM provided the patient the 30 day free card and informed him of the cost after the first 30 days. The patient stated he could afford the Eliquis. Patient also ordered a rolling walker. Walker provided to the patient. Bedside RN updated.   Addendum: Abby from [facility] notified and accept the referral. She sent it over to [facility] and they accepted. Family notified and in agreement with using [facility].   Expected Discharge Date:                  Expected Discharge Plan:  [place]  In-House Referral:  Clinical Social Work  Discharge planning Services  CM Consult  Post Acute Care Choice:  Home Health Choice offered to:  Patient, Spouse, Adult Children  DME Arranged:    DME Agency:     HH Arranged:  RN, PT, OT, Social Work [employer] Agency:  [place]  Status of Service:  Completed, signed off  Medicare Important Message Given:  Yes Date Medicare IM Given:    Medicare IM give by:    Date Additional Medicare IM Given:    Additional Medicare Important Message give by:     If discussed at [place] of Stay Meetings, dates discussed:    Additional Comments:  Pollie Friar, RN 07/18/2015, 4:16 PM

## 2015-07-18 NOTE — Progress Notes (Signed)
Physical Therapy Treatment Patient Details Name: Phillip Solis MRN: [id number] DOB: Nov 13, 1945 Today's Date: 08/07/2015    History of Present Illness Pt presents with L MCA Infarct post tpa and Bil Cerebral Angiogram with revascularization.  Pt intubated 07/11/15 - 07/13/15 and now with A-fib.  Pt with hx of A-fib and CHF.      PT Comments    Pt approaching supervision with gait with SPC, continues with slight balance deficits requiring min guard with turning and obstacle negotiation. Pt improving and progressing towards goals.  Follow Up Recommendations  SNF     Equipment Recommendations  Rolling walker with 5" wheels    Recommendations for Other Services       Precautions / Restrictions Precautions Precautions: Fall Precaution Comments: Watch HR.  Keep under 150 Restrictions Weight Bearing Restrictions: No    Mobility  Bed Mobility                  Transfers   Equipment used: Straight cane   Sit to Stand: Min guard Stand pivot transfers: Min guard       General transfer comment: cues for safety  Ambulation/Gait Ambulation/Gait assistance: Min guard;Supervision Ambulation Distance (Feet): 150 Feet Assistive device: Straight cane       General Gait Details: very slow, cautious pace, supervision for straight lines, min guard with turns   Science writer    Modified Rankin (Stroke Patients Only)       Balance                                    Cognition Arousal/Alertness: Awake/alert Behavior During Therapy: WFL for tasks assessed/performed             Safety/Judgement: Decreased awareness of safety;Decreased awareness of deficits     General Comments: deficits in safety and awareness    Exercises      General Comments        Pertinent Vitals/Pain Pain Assessment: No/denies pain    Home Living                      Prior Function            PT Goals (current goals can  now be found in the care plan section) Acute Rehab PT Goals Patient Stated Goal: Pt reports that he wants to go home. PT Goal Formulation: With patient Time For Goal Achievement: 07/28/15 Potential to Achieve Goals: Good Progress towards PT goals: Progressing toward goals    Frequency  Min 4X/week    PT Plan      Co-evaluation             End of Session Equipment Utilized During Treatment: Gait belt Activity Tolerance: Patient tolerated treatment well Patient left: in chair;with call bell/phone within reach;with chair alarm set     Time: 1300-1315 PT Time Calculation (min) (ACUTE ONLY): 15 min  Charges:  $Gait Training: 8-22 mins                    G Codes:      Phillip Solis Aug 07, 2015, 1:41 PM

## 2015-07-24 ENCOUNTER — Telehealth: Payer: Self-pay | Admitting: Internal Medicine

## 2015-07-24 NOTE — Telephone Encounter (Signed)
New message      Nurse is at the home of the patient---his feet is 2 plus pitting edema up to mid calf.  Pt states this is normal for him.  Nurse want to know if he needs a fluid pill?

## 2015-07-24 NOTE — Telephone Encounter (Signed)
I will address that we he sees me at follow-up.  Dr. Lemmie Evens

## 2015-07-24 NOTE — Telephone Encounter (Signed)
Returned call to [facility] with [place].Stated this is her first time seeing patient this morning.Stated patient doing good, no sob.Stated patient has 2+ edema in both lower legs.Patient said that was normal for him.Stated she reviewed medications and noticed he is not taking lasix.She thought he might benefit from lasix 20 mg daily.Message sent to Dr.Hilty for advice.

## 2015-07-24 NOTE — Telephone Encounter (Signed)
Returned call to [place] with [facility].Dr.Hilty's recommendations given.

## 2015-07-27 NOTE — Progress Notes (Signed)
Electrophysiology Office Note   Date:  07/28/2015   ID:  Phillip Solis, DOB 19-Sep-1945, MRN [id number]  PCP:  Teressa Lower, MD  Cardiologist:  Debara Pickett Primary Electrophysiologist:  Nikhil Osei Phillip Leeds, MD    Chief Complaint  Patient presents with  . Hospitalization Follow-up  . PAF     History of Present Illness: Phillip Solis is a 70 y.o. male who presents today for electrophysiology evaluation.   He had new onset AF in 2011 in the setting of vomiting requiring endocsopy for food stuck in his troat.  It resolved sonataneously. He recently presented to the hospital on 07/11/15 with an acute ischemic stroke. 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  Since that time, he has done well with rehab.  He says that he is close to normal at the moment.  He feels well without complaint.  Today, he denies symptoms of palpitations, chest pain, shortness of breath, orthopnea, PND, lower extremity edema, claudication, dizziness, presyncope, syncope, bleeding, or neurologic sequela. The patient is tolerating medications without difficulties and is otherwise without complaint today.    Past Medical History  Diagnosis Date  . History of atrial fibrillation     in setting of endoscopy in 2011; resolved  . History of palpitations   . Atrial fibrillation with rapid ventricular response ([place]) 07/14/2015   Past Surgical History  Procedure Laterality Date  . Left and right heart catheterization with coronary angiogram N/A 07/23/2014    Procedure: LEFT AND RIGHT HEART CATHETERIZATION WITH CORONARY ANGIOGRAM;  Surgeon: [name], MD;  Location: [facility] CATH LAB;  Service: Cardiovascular;  Laterality: N/A;  . Radiology with anesthesia N/A 07/11/2015    Procedure: RADIOLOGY WITH ANESTHESIA;  Surgeon: [name], MD;  Location: [place];  Service: Radiology;  Laterality: N/A;     Current Outpatient Prescriptions    Medication Sig Dispense Refill  . apixaban (ELIQUIS) 5 MG TABS tablet Take 1 tablet (5 mg total) by mouth 2 (two) times daily. 180 tablet 1  . atorvastatin (LIPITOR) 20 MG tablet Take 1 tablet (20 mg total) by mouth daily at 6 PM. 90 tablet 1  . feeding supplement, ENSURE ENLIVE, (ENSURE ENLIVE) LIQD Take 237 mLs by mouth 2 (two) times daily between meals. 237 mL 12  . metoprolol succinate (TOPROL-XL) 25 MG 24 hr tablet Take 1 tablet (25 mg total) by mouth daily. 90 tablet 1  . Multiple Vitamin (MULTIVITAMIN WITH MINERALS) TABS tablet Take 1 tablet by mouth daily.     No current facility-administered medications for this visit.    Allergies:   Review of patient's allergies indicates no known allergies.   Social History:  The patient  reports that he quit smoking about 42 years ago. His smokeless tobacco use includes Chew. He reports that he drinks alcohol. He reports that he does not use illicit drugs.   Family History:  The patient's family history includes Cancer in his father; Heart Problems in his mother.    ROS:  Please see the history of present illness.   Otherwise, review of systems is positive for none.   All other systems are reviewed and negative.    PHYSICAL EXAM: VS:  BP 122/70 mmHg  Pulse 56  Ht '5\' 8"'$  (1.727 m)  Wt 166 lb 3.2 oz (75.388 kg)  BMI 25.28 kg/m2 , BMI Body mass index is 25.28 kg/(m^2). GEN: Well nourished, well developed, in no acute distress  HEENT: normal Neck: no JVD, carotid bruits, or masses Cardiac: RRR; no murmurs, rubs, or gallops,no edema  Respiratory:  clear to auscultation bilaterally, normal work of breathing GI: soft, nontender, nondistended, + BS MS: no deformity or atrophy Skin: warm and dry Neuro:  Strength and sensation are intact Psych: euthymic mood, full affect  EKG:  EKG is ordered today. The ekg ordered today shows sinus rhythm, rate 56, incomplete LBBB  Recent Labs: 07/13/2015: ALT 19 07/15/2015: BUN 9; Creatinine, Ser 0.83;  Hemoglobin 10.0*; Platelets 137*; Potassium 3.9; Sodium 144; TSH 0.889    Lipid Panel     Component Value Date/Time   CHOL 182 07/12/2015 0558   TRIG 209* 07/12/2015 0558   TRIG 207* 07/12/2015 0558   HDL 21* 07/12/2015 0558   CHOLHDL 8.7 07/12/2015 0558   VLDL 41* 07/12/2015 0558   LDLCALC 120* 07/12/2015 0558     Wt Readings from Last 3 Encounters:  07/28/15 166 lb 3.2 oz (75.388 kg)  07/15/15 166 lb 0.1 oz (75.3 kg)  06/05/15 164 lb 12.8 oz (74.753 kg)      Other studies Reviewed: Additional studies/ records that were reviewed today include: TTE 07/13/15  Review of the above records today demonstrates:  - Left ventricle: The cavity size was normal. Systolic function was  mildly reduced. The estimated ejection fraction was in the range  of 45% to 50%. There is akinesis of the basal-midinferolateral  myocardium. Doppler parameters are consistent with abnormal left  ventricular relaxation (grade 1 diastolic dysfunction). - Aortic valve: There was trivial regurgitation. - Mitral valve: There was mild regurgitation. - Left atrium: The atrium was normal in size.  Cardiac Cath Impression: 1. Angiographically normal coronary arteries 2. Severe global hypokinesis, LVEF 35-40% 3. Compensated right and left heart filling pressures 4. Moderately decreased cardiac output  ASSESSMENT AND PLAN:  1.  Paroxysmal Atrial fibrillation: Likely the cause of his stroke.  Has a CHADS2VASc of at least 4 now.  On Eliquis today.  Stroke symptoms improving.  He is in sinus rhythm today and is not aware of any symptoms of AF.  Staley Budzinski continue Eliquis. As he is in [place] and not having symptoms, Burle Kwan continue current management.  If he has further symptoms, Maizey Menendez consider a rhythm control approach.   Current medicines are reviewed at length with the patient today.   The patient does not have concerns regarding his medicines.  The following changes were made today:  none  Labs/ tests ordered  today include:  Orders Placed This Encounter  Procedures  . EKG 12-Lead     Disposition:   FU with Shambhavi Salley 3 months  Signed, Reann Dobias Phillip Leeds, MD  07/28/2015 10:39 AM     [facility] [address] [place] [address] [phone] (office) [phone] (fax)

## 2015-07-28 ENCOUNTER — Encounter: Payer: Self-pay | Admitting: Cardiology

## 2015-07-28 ENCOUNTER — Ambulatory Visit (INDEPENDENT_AMBULATORY_CARE_PROVIDER_SITE_OTHER): Payer: Medicare HMO | Admitting: Cardiology

## 2015-07-28 VITALS — BP 122/70 | HR 56 | Ht 68.0 in | Wt 166.2 lb

## 2015-07-28 DIAGNOSIS — I480 Paroxysmal atrial fibrillation: Secondary | ICD-10-CM

## 2015-07-28 MED ORDER — APIXABAN 5 MG PO TABS: 5.0000 mg | ORAL_TABLET | Freq: Two times a day (BID) | ORAL | Status: DC

## 2015-07-28 MED ORDER — ATORVASTATIN CALCIUM 20 MG PO TABS: 20.0000 mg | ORAL_TABLET | Freq: Every day | ORAL | Status: DC

## 2015-07-28 MED ORDER — METOPROLOL SUCCINATE ER 25 MG PO TB24: 25.0000 mg | ORAL_TABLET | Freq: Every day | ORAL | Status: DC

## 2015-07-28 NOTE — Patient Instructions (Addendum)
Medication Instructions:  Your physician recommends that you continue on your current medications as directed. Please refer to the Current Medication list given to you today.  - refills were sent in for Eliquis, Metoprolol, and Atorvastatin  Labwork: None ordered  Testing/Procedures: None ordered  Follow-Up: Your physician recommends that you schedule a follow-up appointment in: 3 months with Dr. Curt Bears.  If you need a refill on your cardiac medications before your next appointment, please call your pharmacy.  Thank you for choosing [facility]!!   Trinidad Curet, RN [phone]

## 2015-07-29 ENCOUNTER — Telehealth: Payer: Self-pay | Admitting: Cardiology

## 2015-07-29 NOTE — Telephone Encounter (Signed)
New message    Home health in the home now    1. Call last week to be address at patient visit with Dr. Curt Bears - patient is not on any water pill - minor heart failure.  H/o cva   2. Update med list - does not have ASA on the listed. Patient on ASA and Eliquis daily.  Does Baby Asa need to be continue or not.    3. Vital sign for last couple of visit are normal.    4. Nurse is asking for a call back

## 2015-07-29 NOTE — Telephone Encounter (Signed)
Advised to stop ASA, continue taking Eliquis. She also tells me that patient still has 2+ pitting BLEE. C/o shoes/socks tight, feet hurt by the end of the day. Per Dr. Debara Pickett on 3/16: I will address that when he see me at follow up.   Will send back to Dr. Debara Pickett for medication recommendations.    Hx: CSCHF Follow up is not scheduled and recall in for July.

## 2015-07-31 ENCOUNTER — Other Ambulatory Visit (HOSPITAL_COMMUNITY): Payer: Self-pay | Admitting: Interventional Radiology

## 2015-07-31 ENCOUNTER — Telehealth: Payer: Self-pay | Admitting: Internal Medicine

## 2015-07-31 DIAGNOSIS — I639 Cerebral infarction, unspecified: Secondary | ICD-10-CM

## 2015-07-31 DIAGNOSIS — R600 Localized edema: Secondary | ICD-10-CM

## 2015-07-31 MED ORDER — FUROSEMIDE 20 MG PO TABS: 20.0000 mg | ORAL_TABLET | Freq: Every day | ORAL | Status: DC

## 2015-07-31 NOTE — Telephone Encounter (Signed)
Janett Billow called in wanting to speak with a nurse about the pt's condition. She says that he has a diagnosis of CHF and it retaining fluid in his lower extremities. The pt has has a stroke recently and she feels he desparately needs to be on a fluid pill. She would like to know if this can be called in for the pt or does he need to be seen? Please f/u with her

## 2015-07-31 NOTE — Telephone Encounter (Signed)
Start lasix 20 mg daily - elevate feet. Check BMP/BNP next Monday.  Dr. Lemmie Evens

## 2015-07-31 NOTE — Telephone Encounter (Signed)
Spoke to [employer]  She states patient has 1 to 2 + edema lower legs  R > L  ANLKLE   LUNGS ARE CLEAR - Decrease breathe sounds on the right base , no cough  Per Janett Billow Patient does not have a scale Per Jessica,she sees patient in the mornings and legs are already puffy. Question if patient may need a diuretics- none taken at present time.  WILL DEFER DR HILTY and call back RN and patient.

## 2015-07-31 NOTE — Telephone Encounter (Signed)
RN called spoke to [employer] , patient and left message for wife on her voice mail Information given  Labs also order  Melvin aware.

## 2015-08-06 ENCOUNTER — Telehealth: Payer: Self-pay | Admitting: Internal Medicine

## 2015-08-06 NOTE — Telephone Encounter (Signed)
Faxed signed home health certification/[facility].

## 2015-08-06 NOTE — Telephone Encounter (Signed)
I think that I already  answered this - go ahead and start lasix 20 mg daily.  Dr. Lemmie Evens

## 2015-08-06 NOTE — Telephone Encounter (Signed)
Orders for lasix '20mg'$  QD signed by MD and faxed to [facility] [phone]

## 2015-08-15 ENCOUNTER — Ambulatory Visit (HOSPITAL_COMMUNITY): Payer: Medicare HMO

## 2015-08-25 ENCOUNTER — Telehealth: Payer: Self-pay | Admitting: Cardiology

## 2015-08-25 NOTE — Telephone Encounter (Signed)
New message   The blood thinner medication was changed to Eliquis by Dr.Camnitz  He can  Not afford there medication and he will need a different medication  Be has two weeks of medication left  He will run out before his June 30 apt

## 2015-08-29 ENCOUNTER — Ambulatory Visit (HOSPITAL_COMMUNITY): Payer: Medicare HMO

## 2015-09-01 ENCOUNTER — Inpatient Hospital Stay: Payer: Medicare HMO | Admitting: Adult Health

## 2015-09-12 ENCOUNTER — Encounter: Payer: Self-pay | Admitting: Cardiology

## 2015-09-15 ENCOUNTER — Ambulatory Visit (HOSPITAL_COMMUNITY): Payer: Medicare HMO

## 2015-10-01 ENCOUNTER — Ambulatory Visit (INDEPENDENT_AMBULATORY_CARE_PROVIDER_SITE_OTHER): Payer: Medicare HMO | Admitting: Neurology

## 2015-10-01 ENCOUNTER — Encounter: Payer: Self-pay | Admitting: Neurology

## 2015-10-01 VITALS — BP 105/76 | HR 75 | Ht 68.0 in | Wt 160.0 lb

## 2015-10-01 DIAGNOSIS — I63132 Cerebral infarction due to embolism of left carotid artery: Secondary | ICD-10-CM | POA: Diagnosis not present

## 2015-10-01 MED ORDER — APIXABAN 5 MG PO TABS: 5.0000 mg | ORAL_TABLET | Freq: Two times a day (BID) | ORAL | Status: DC

## 2015-10-01 NOTE — Patient Instructions (Signed)
I had a long d/w patient about his recent embolic stroke,atrial fibrillation, risk for recurrent stroke/TIAs, personally independently reviewed imaging studies and stroke evaluation results and answered questions.Continue Eliquis 5 mg twice daily for secondary stroke prevention and maintain strict control of hypertension with blood pressure goal below 130/90, diabetes with hemoglobin A1c goal below 6.5% and lipids with LDL cholesterol goal below 70 mg/dL. I also advised the patient to eat a healthy diet with plenty of whole grains, cereals, fruits and vegetables, exercise regularly and maintain ideal body weight . He was given a refill of eliquis upon his request and advised to follow-up with cardiologist Dr.Cammitz in the future for the same Followup in the future with me in 6 months or call earlier if necessary Stroke Prevention Some medical conditions and behaviors are associated with an increased chance of having a stroke. You may prevent a stroke by making healthy choices and managing medical conditions. HOW CAN I REDUCE MY RISK OF HAVING A STROKE?   Stay physically active. Get at least 30 minutes of activity on most or all days.  Do not smoke. It may also be helpful to avoid exposure to secondhand smoke.  Limit alcohol use. Moderate alcohol use is considered to be:  No more than 2 drinks per day for men.  No more than 1 drink per day for nonpregnant women.  Eat healthy foods. This involves:  Eating 5 or more servings of fruits and vegetables a day.  Making dietary changes that address high blood pressure (hypertension), high cholesterol, diabetes, or obesity.  Manage your cholesterol levels.  Making food choices that are high in fiber and low in saturated fat, trans fat, and cholesterol may control cholesterol levels.  Take any prescribed medicines to control cholesterol as directed by your health care provider.  Manage your diabetes.  Controlling your carbohydrate and sugar intake  is recommended to manage diabetes.  Take any prescribed medicines to control diabetes as directed by your health care provider.  Control your hypertension.  Making food choices that are low in salt (sodium), saturated fat, trans fat, and cholesterol is recommended to manage hypertension.  Ask your health care provider if you need treatment to lower your blood pressure. Take any prescribed medicines to control hypertension as directed by your health care provider.  If you are 23-57 years of age, have your blood pressure checked every 3-5 years. If you are 44 years of age or older, have your blood pressure checked every year.  Maintain a healthy weight.  Reducing calorie intake and making food choices that are low in sodium, saturated fat, trans fat, and cholesterol are recommended to manage weight.  Stop drug abuse.  Avoid taking birth control pills.  Talk to your health care provider about the risks of taking birth control pills if you are over 40 years old, smoke, get migraines, or have ever had a blood clot.  Get evaluated for sleep disorders (sleep apnea).  Talk to your health care provider about getting a sleep evaluation if you snore a lot or have excessive sleepiness.  Take medicines only as directed by your health care provider.  For some people, aspirin or blood thinners (anticoagulants) are helpful in reducing the risk of forming abnormal blood clots that can lead to stroke. If you have the irregular heart rhythm of atrial fibrillation, you should be on a blood thinner unless there is a good reason you cannot take them.  Understand all your medicine instructions.  Make sure that other  conditions (such as anemia or atherosclerosis) are addressed. SEEK IMMEDIATE MEDICAL CARE IF:   You have sudden weakness or numbness of the face, arm, or leg, especially on one side of the body.  Your face or eyelid droops to one side.  You have sudden confusion.  You have trouble  speaking (aphasia) or understanding.  You have sudden trouble seeing in one or both eyes.  You have sudden trouble walking.  You have dizziness.  You have a loss of balance or coordination.  You have a sudden, severe headache with no known cause.  You have new chest pain or an irregular heartbeat. Any of these symptoms may represent a serious problem that is an emergency. Do not wait to see if the symptoms will go away. Get medical help at once. Call your local emergency services (911 in [place] Do not drive yourself to the hospital.   This information is not intended to replace advice given to you by your health care provider. Make sure you discuss any questions you have with your health care provider.   Document Released: 06/03/2004 Document Revised: 05/17/2014 Document Reviewed: 10/27/2012 Elsevier Interactive Patient Education [employer].

## 2015-10-01 NOTE — Progress Notes (Signed)
[facility] [address] [place]. [place] [address] [phone]       OFFICE FOLLOW-UP NOTE  Phillip. Phillip Solis Date of Birth:  1945-09-22 Medical Record Number:  [id number]   HPI: Phillip Solis is a 48 year Caucasian male seen today for first office follow-up visit following hospital admission for stroke in March 2017.Phillip Solis is a 71 y.o. male who was in his normal state of health when his wife was getting around 28 PM. On 07/11/15. He then went to lay down, and then heard a thump. She went to check on him and found him with right-sided weakness and difficulty speaking. EMS was called and code stroke was activated. Following head CT with no signs of hemorrhage, CT angiogram was performed given the signs of a likely large vessel ischemic stroke. IV TPA was initiated and interventional radiology was notified. He was recently diagnosed with CHF, with an EF 35-40%. He also has a history of atrial fibrillation and a single time after an endoscopy with no recurrence. Given a low chads-Vasc score, he was not started on anticoagulation. Though his recent diagnosis of CHF did raise it to 1, given that he did not have any recurrence in the interim since 2011 and it was only in the setting of a procedure he was not started on anticoagulation. 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 He was taken for emergent mechanical embolectomy which was performed successfully without by Dr. Estanislado Pandy using solitaire device and intra-arterial TPA. Patient was admitted to the intensive care unit where blood pressure was tightly controlled. Follow-up scan showed stable appearance and MRI scan showed patchy left MCA mostly subcortical infarct. Patient was extubated and did well. He had only mild expressive  aphasia and some diminished fine motor skills and right-sided weakness. He was seen by physical occupational therapy and initial plan was to send him to inpatient rehabilitation but patient was doing well and he wanted to go home and refused rehabilitation transfer. He was started on eliquis for anticoagulation for atrial fibrillation and medications for blood pressure and cholesterol. Patient states his done well his finished outpatient physical occupational therapy. His speech is completely back to normal. On he occasionally when his diet he may have some word finding difficulties or drag his right leg. He is returned back to [place] activities of daily living. Is quite active and walks every day. Is tolerating eliquis without bleeding or bruising. States his blood pressure is usually well controlled though it is slightly elevated in office today. Is tolerating Lipitor well without muscle aches or pains. He has an appointment to see his cardiologist in a few weeks. He is nearly out of his eliquis and is requesting a refill.  ROS:   14 system review of systems is positive for occasional speech difficulty and dragging right leg and all other  systems negative  PMH:  Past Medical History  Diagnosis Date  . History of atrial fibrillation     in setting of endoscopy in 2011; resolved  . History of palpitations   . Atrial fibrillation with rapid ventricular response ([place]) 07/14/2015  . Stroke [facility])     Social History:  Social History   Social History  . Marital Status: Married    Spouse Name: N/A  .  Number of Children: 2  . Years of Education: N/A   Occupational History  . Not on file.   Social History Main Topics  . Smoking status: Former Smoker    Quit date: 03/19/1973  . Smokeless tobacco: Current User    Types: Chew  . Alcohol Use: 0.6 oz/week    0 Standard drinks or equivalent, 1 Cans of beer per week     Comment: occasional beer  . Drug Use: No  . Sexual Activity: Not on file    Other Topics Concern  . Not on file   Social History Narrative    Medications:   Current Outpatient Prescriptions on File Prior to Visit  Medication Sig Dispense Refill  . atorvastatin (LIPITOR) 20 MG tablet Take 1 tablet (20 mg total) by mouth daily at 6 PM. 90 tablet 1  . feeding supplement, ENSURE ENLIVE, (ENSURE ENLIVE) LIQD Take 237 mLs by mouth 2 (two) times daily between meals. 237 mL 12  . furosemide (LASIX) 20 MG tablet Take 1 tablet (20 mg total) by mouth daily. 30 tablet 4  . metoprolol succinate (TOPROL-XL) 25 MG 24 hr tablet Take 1 tablet (25 mg total) by mouth daily. 90 tablet 1  . Multiple Vitamin (MULTIVITAMIN WITH MINERALS) TABS tablet Take 1 tablet by mouth daily.     No current facility-administered medications on file prior to visit.    Allergies:  No Known Allergies  Physical Exam General: well developed, well nourished Elderly Caucasian male, seated, in no evident distress Head: head normocephalic and atraumatic.  Neck: supple with no carotid or supraclavicular bruits Cardiovascular: regular rate and rhythm, no murmurs Musculoskeletal: no deformity Skin:  no rash/petichiae Vascular:  Normal pulses all extremities Filed Vitals:   10/01/15 0934  BP: 105/76  Pulse: 75   Neurologic Exam Mental Status: Awake and fully alert. Oriented to place and time. Recent and remote memory intact. Attention span, concentration and fund of knowledge appropriate. Mood and affect appropriate.  Cranial Nerves: Fundoscopic exam reveals sharp disc margins. Pupils equal, briskly reactive to light. Extraocular movements full without nystagmus. Visual fields full to confrontation. Hearing intact. Facial sensation intact. Face, tongue, palate moves normally and symmetrically.  Motor: Normal bulk and tone. Normal strength in all tested extremity muscles.Diminished fine finger movements on the right. Orbits left over right upper extremity. Sensory.: intact to touch ,pinprick  .position and vibratory sensation.  Coordination: Rapid alternating movements normal in all extremities. Finger-to-nose and heel-to-shin performed accurately bilaterally. Gait and Station: Arises from chair without difficulty. Stance is normal. Gait demonstrates normal stride length and balance . Able to heel, toe and tandem walk without difficulty.  Reflexes: 1+ and symmetric. Toes downgoing.   NIHSS  0 Modified Rankin  1   ASSESSMENT: 43 year Caucasian male with embolic left MCA infarct in March 2017 due to left middle cerebral artery occlusion from a to fibrillation status post mechanical embolectomy with solitaire device and intra-arterial TPA with complete recanalization. Vascular risk factors of hypertension, hyperlipidemia and atrial fibrillation. Patient is doing clinically quite well with near complete recovery ( NIHSS 21 on admisssion to 0 now)    PLAN: I had a long d/w patient about his recent embolic stroke,atrial fibrillation, risk for recurrent stroke/TIAs, personally independently reviewed imaging studies and stroke evaluation results and answered questions.Continue Eliquis 5 mg twice daily for secondary stroke prevention and maintain strict control of hypertension with blood pressure goal below 130/90, diabetes with hemoglobin A1c goal below 6.5% and lipids with LDL cholesterol  goal below 70 mg/dL. I also advised the patient to eat a healthy diet with plenty of whole grains, cereals, fruits and vegetables, exercise regularly and maintain ideal body weight . He was given a refill of eliquis upon his request and advised to follow-up with cardiologist Dr.Cammitz in the future for the same Followup in the future with me in 6 months or call earlier if necessary.Greater than 50% of time during this 25 minute visit was spent on counseling,explanation of diagnosis, planning of further management, discussion with patient and family and coordination of care Antony Contras, MD  [facility] [address] [place], [place] [address]  Phone [phone] Fax [phone] Note: This document was prepared with digital dictation and possible smart phrase technology. Any transcriptional errors that result from this process are unintentional

## 2015-10-16 NOTE — Patient Outreach (Signed)
[facility]) Care Management  10/16/2015  Phillip Solis [birth year] [id number]   Chart review to begin telephone outreach to patient to obtain mRS indicated score was collected at MD office visit on 10/01/15. mRS =  1   Josepha Pigg, [place] Management Assistant

## 2015-10-20 NOTE — Progress Notes (Signed)
Electrophysiology Office Note   Date:  10/21/2015   ID:  Phillip Solis, DOB October 27, 1945, MRN [id number]  PCP:  Teressa Lower, MD  Cardiologist:  Debara Pickett Primary Electrophysiologist:  Constance Haw, MD    Chief Complaint  Patient presents with  . Follow-up     History of Present Illness: Phillip Solis is a 70 y.o. male who presents today for electrophysiology evaluation.   He had new onset AF in 2011 in the setting of vomiting requiring endocsopy for food stuck in his troat.  It resolved sonataneously. He recently presented to the hospital on 07/11/15 with an acute ischemic stroke. 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    Today, he denies symptoms of palpitations, chest pain, shortness of breath, orthopnea, PND, lower extremity edema, claudication, dizziness, presyncope, syncope, bleeding, or neurologic sequela. The patient is tolerating medications without difficulties and is otherwise without complaint today. He is able to exercise and work in the yard without issues. He says that his recovery has got him almost back to normal.   Past Medical History  Diagnosis Date  . History of atrial fibrillation     in setting of endoscopy in 2011; resolved  . History of palpitations   . Atrial fibrillation with rapid ventricular response ([place]) 07/14/2015  . Stroke [facility])    Past Surgical History  Procedure Laterality Date  . Left and right heart catheterization with coronary angiogram N/A 07/23/2014    Procedure: LEFT AND RIGHT HEART CATHETERIZATION WITH CORONARY ANGIOGRAM;  Surgeon: [name], MD;  Location: [facility] CATH LAB;  Service: Cardiovascular;  Laterality: N/A;  . Radiology with anesthesia N/A 07/11/2015    Procedure: RADIOLOGY WITH ANESTHESIA;  Surgeon: [name], MD;  Location: [place];  Service: Radiology;  Laterality: N/A;     Current Outpatient Prescriptions  Medication Sig  Dispense Refill  . apixaban (ELIQUIS) 5 MG TABS tablet Take 1 tablet (5 mg total) by mouth 2 (two) times daily. 180 tablet 1  . atorvastatin (LIPITOR) 20 MG tablet Take 1 tablet (20 mg total) by mouth daily at 6 PM. 90 tablet 1  . feeding supplement, ENSURE ENLIVE, (ENSURE ENLIVE) LIQD Take 237 mLs by mouth 2 (two) times daily between meals. 237 mL 12  . furosemide (LASIX) 20 MG tablet Take 1 tablet (20 mg total) by mouth daily. 30 tablet 4  . metoprolol succinate (TOPROL-XL) 25 MG 24 hr tablet Take 1 tablet (25 mg total) by mouth daily. 90 tablet 1  . Multiple Vitamin (MULTIVITAMIN WITH MINERALS) TABS tablet Take 1 tablet by mouth daily.     No current facility-administered medications for this visit.    Allergies:   Review of patient's allergies indicates no known allergies.   Social History:  The patient  reports that he quit smoking about 42 years ago. His smokeless tobacco use includes Chew. He reports that he drinks about 0.6 oz of alcohol per week. He reports that he does not use illicit drugs.   Family History:  The patient's family history includes Cancer in his father; Heart Problems in his mother; Stroke in his father.    ROS:  Please see the history of present illness.   Otherwise, review of systems is positive for weight change.   All other systems are reviewed and negative.    PHYSICAL EXAM: VS:  BP 118/80 mmHg  Pulse 84  Ht '5\' 8"'$  (1.727 m)  Wt  156 lb 6.4 oz (70.943 kg)  BMI 23.79 kg/m2 , BMI Body mass index is 23.79 kg/(m^2). GEN: Well nourished, well developed, in no acute distress HEENT: normal Neck: no JVD, carotid bruits, or masses Cardiac: RRR; no murmurs, rubs, or gallops,no edema  Respiratory:  clear to auscultation bilaterally, normal work of breathing GI: soft, nontender, nondistended, + BS MS: no deformity or atrophy Skin: warm and dry Neuro:  Strength and sensation are intact Psych: euthymic mood, full affect  EKG:  EKG is ordered today. ECG today  shows sinus rhythm, PRWP, possible old anterior infarct, rate 84  Recent Labs: 07/13/2015: ALT 19 07/15/2015: BUN 9; Creatinine, Ser 0.83; Hemoglobin 10.0*; Platelets 137*; Potassium 3.9; Sodium 144; TSH 0.889    Lipid Panel     Component Value Date/Time   CHOL 182 07/12/2015 0558   TRIG 209* 07/12/2015 0558   TRIG 207* 07/12/2015 0558   HDL 21* 07/12/2015 0558   CHOLHDL 8.7 07/12/2015 0558   VLDL 41* 07/12/2015 0558   LDLCALC 120* 07/12/2015 0558     Wt Readings from Last 3 Encounters:  10/21/15 156 lb 6.4 oz (70.943 kg)  10/01/15 160 lb (72.576 kg)  07/28/15 166 lb 3.2 oz (75.388 kg)      Other studies Reviewed: Additional studies/ records that were reviewed today include: TTE 07/13/15  Review of the above records today demonstrates:  - Left ventricle: The cavity size was normal. Systolic function was  mildly reduced. The estimated ejection fraction was in the range  of 45% to 50%. There is akinesis of the basal-midinferolateral  myocardium. Doppler parameters are consistent with abnormal left  ventricular relaxation (grade 1 diastolic dysfunction). - Aortic valve: There was trivial regurgitation. - Mitral valve: There was mild regurgitation. - Left atrium: The atrium was normal in size.  Cardiac Cath Impression: 1. Angiographically normal coronary arteries 2. Severe global hypokinesis, LVEF 35-40% 3. Compensated right and left heart filling pressures 4. Moderately decreased cardiac output  ASSESSMENT AND PLAN:  1.  Paroxysmal Atrial fibrillation: Likely the cause of his stroke.  Has a CHADS2VASc of at least 4 now.  On Eliquis today.  Stroke symptoms improving.  He is in sinus rhythm today and is not aware of any symptoms of AF.  Shalee Paolo continue Eliquis. This is currently in sinus rhythm and is asymptomatic. We'll make no further changes.   Current medicines are reviewed at length with the patient today.   The patient does not have concerns regarding his  medicines.  The following changes were made today:  none Following Labs/ tests ordered today include:  No orders of the defined types were placed in this encounter.     Disposition:   FU with Phillip Solis 1  year  Signed, Giannis Corpuz Meredith Leeds, MD  10/21/2015 10:05 AM     [address] [place] [address] [phone] (office) [phone] (fax)

## 2015-10-21 ENCOUNTER — Ambulatory Visit (INDEPENDENT_AMBULATORY_CARE_PROVIDER_SITE_OTHER): Payer: Medicare HMO | Admitting: Cardiology

## 2015-10-21 ENCOUNTER — Encounter: Payer: Self-pay | Admitting: Cardiology

## 2015-10-21 VITALS — BP 118/80 | HR 84 | Ht 68.0 in | Wt 156.4 lb

## 2015-10-21 DIAGNOSIS — I480 Paroxysmal atrial fibrillation: Secondary | ICD-10-CM | POA: Diagnosis not present

## 2015-10-21 NOTE — Patient Instructions (Signed)
Medication Instructions:  Your physician recommends that you continue on your current medications as directed. Please refer to the Current Medication list given to you today.  Labwork: None ordered  Testing/Procedures: None ordered  Follow-Up: Your physician wants you to follow-up in: 1 year with Dr. Curt Bears.  You will receive a reminder letter in the mail two months in advance. If you don't receive a letter, please call our office to schedule the follow-up appointment.  If you need a refill on your cardiac medications before your next appointment, please call your pharmacy.  Thank you for choosing [facility]!!   Trinidad Curet, RN [phone]

## 2015-10-28 ENCOUNTER — Ambulatory Visit: Payer: Medicare HMO | Admitting: Cardiology

## 2015-11-07 ENCOUNTER — Ambulatory Visit: Payer: Medicare HMO | Admitting: Cardiology

## 2015-11-26 ENCOUNTER — Ambulatory Visit (INDEPENDENT_AMBULATORY_CARE_PROVIDER_SITE_OTHER): Payer: Medicare HMO | Admitting: Internal Medicine

## 2015-11-26 ENCOUNTER — Encounter: Payer: Self-pay | Admitting: Internal Medicine

## 2015-11-26 VITALS — BP 102/66 | HR 73 | Ht 68.0 in | Wt 156.2 lb

## 2015-11-26 DIAGNOSIS — R5383 Other fatigue: Secondary | ICD-10-CM | POA: Diagnosis not present

## 2015-11-26 DIAGNOSIS — I429 Cardiomyopathy, unspecified: Secondary | ICD-10-CM | POA: Diagnosis not present

## 2015-11-26 DIAGNOSIS — R0609 Other forms of dyspnea: Secondary | ICD-10-CM

## 2015-11-26 DIAGNOSIS — I428 Other cardiomyopathies: Secondary | ICD-10-CM

## 2015-11-26 DIAGNOSIS — I480 Paroxysmal atrial fibrillation: Secondary | ICD-10-CM

## 2015-11-26 NOTE — Patient Instructions (Addendum)
Your physician recommends that you continue on your current medications as directed. Please refer to the Current Medication list given to you today.  Your physician wants you to follow-up in: 6 months with Dr. Debara Pickett. You will receive a reminder letter in the mail two months in advance. If you don't receive a letter, please call our office to schedule the follow-up appointment.

## 2015-11-26 NOTE — Progress Notes (Signed)
OFFICE NOTE  Chief Complaint:  Hospital follow-up  Primary Care Physician: Teressa Lower, MD  HPI:  Phillip Solis is a 70 year old gentleman who I first saw in 2011 with new-onset atrial fibrillation in the setting of vomiting and during an endoscopy to remove food which was stuck in his throat. It resolved spontaneously and he has had no recurrence that I am aware of. He occasionally gets palpitations; however, is maintained on bisoprolol 5 mg twice a day. He also takes aspirin due to a low CHADS2 score, which is probably 0. Unfortunately he does not have a primary care provider. Today's also described increasing shortness of breath over the past several months which is worse he notes when walking long distances, especially across the parking lot at [employer]. He says that when he arrives from his car he is markedly short of breath and notes that he's having pain in his legs that usually discontinues his walk. As he stops the symptoms improved. The symptoms have been going on again for several months but increasing somewhat in frequency. Phillip Solis has a strong smoking history with one and half pack per day smoking for over 30 years but he quit a number of years ago.  He has never had an ischemic evaluation to my knowledge. His EKG today does show a interventricular conduction delay and sinus bradycardia with nonspecific T-wave changes.  I saw Phillip Solis back in the office today nearly a year and a half after his last appointment. He had a number of medical problems which I felt needed further evaluation. I had ordered a stress test, abdominal ultrasound and peripheral Dopplers, but he had none of those tests performed. Apparently he lost his insurance and stopped working. He is currently on Medicare with a [employer] supplement. He recently was in [facility] for what he thought was a sinus infection. From the best I can tell he had imaging including a chest x-ray and a head CT. He was placed on  antibiotics however he was told that he had "fluid around his heart". Laboratory work indicated an elevated BNP of 1400. There is no evidence that he was diuresed and an echocardiogram was not performed. Chest x-ray demonstrated hyperinflation with mild linear atelectasis at the lung bases. He was discharged from the emergency department.  With regard to his claudication he reports she's been walking a lot more and he reports his leg pain has improved somewhat.  Phillip Solis returns today for follow-up. He underwent screening abdominal ultrasound as well as lower extremity arterial Dopplers for leg pain. This demonstrated normal ABIs and no evidence for arterial insufficiency. His abdominal aorta was normal. He also underwent an echocardiogram because of his recent episode of heart failure and elevated BNP. This does demonstrate a cardiomyopathy with EF of 35-40%. I'm concerned about underlying coronary artery disease. His symptoms have improved.  I saw Phillip Solis back in the office today. He underwent left and right heart catheterization by myself, the results are as follows:  Hemodynamics: Central Aortic Pressure / Mean Aortic Pressure: 113/64 LV Pressure / LV End diastolic Pressure: 7  Left Ventriculography: EF: 35-40% Wall Motion: global hypokinesis  Right Heart Data:  RA - 2  RV - 22/2  PA - 20/5 (12)   PCWP - 4  TPG - 8  FCO/CI - 3.92 L/min, 2.24 L/min  TDCO/CI - 5.94 L/min, 3.39 L/min  SVR - 22 Wood units ( dynscm?5/80)  PVR - 2.04 Wood units ( dynscm?5/80)  PA  Sat% - 66  AO Sat% - 97  Coronary Angiographic Data:  Left Main: Normal  Left Anterior Descending (LAD): Angiographically normal - smaller caliber, tapers around the apex.  1st diagonal (D1): Moderate-sized vessel, no stenosis.  2nd diagonal (D2): Small to moderate-sized, no stenosis.  Circumflex (LCx): Angiographically normal, follows the AV  groove and gives off a single mid-vessel OM vessel.  1st obtuse marginal: Angiographically normal  Right Coronary Artery: Dominant vessel. Larger caliber. No stenosis.  right ventricle branch of right coronary artery: normal  posterior descending artery: No stenosis  posterior lateral branch: No stenosis  Impression: 1. Angiographically normal coronary arteries 2. Severe global hypokinesis, LVEF 35-40% 3. Compensated right and left heart filling pressures 4. Moderately decreased cardiac output  He's had no problems from his catheterization. Overall he feels well. As mentioned above his right heart catheterization shows well compensated filling pressures, but he is not on diuretic. He's currently not on an ACE or ARB, and I believe he did good candidate to start on and trust oh. This would give him some benefit from the ARB as well as natruretic.  Phillip Solis returns today for follow-up. He reports he is feeling well. At last visit I started him on Entresto. He seems to be tolerating this although blood pressure remains low at 100/60 and has not allowed up titration of the medicine. Unfortunately he is not yet established a coverage from the provider so cost of the medicine is is not affordable. We'll provide him with more samples today however he does need to contact patient assistance. He's also on bisoprolol and aspirin. He denies any shortness of breath and has not had any congestive symptoms and therefore he is not currently on any diuretic other than the sacubitril.  I saw Phillip Solis back in the office today for follow-up. He reports he is breathing somewhat better. He is compliant with his medications although it's not clear yet whether or not he is been established to receive Entresto other than with her samples. Blood pressure remained stable in the low 100s. He is currently on the 24/26 mg dose of Entresto. A repeat echo recently shows an improvement in EF up to 45%. LV filling  pressures remain elevated. He still gets some shortness of breath with moderate exertion but he reports this is improved. He says that he is urinating more frequently. As mentioned above he has a remote history of PAF and given his new congestive heart failure, his CHADSVASC score is now 1 (there is no history of hypertension and he is less than age 65). He has not had any recent recurrence of atrial fibrillation therefore were maintaining him on low-dose aspirin.  11/26/2015  Phillip Solis returns today for follow-up. Unfortunately he was recently hospitalized for an acute stroke. He had a history of remote PAF without any recent recurrence. We were treating him for his cardiomyopathy and actually improved with his ejection fraction on Entresto. This medicine, however was stopped, possibly due to hypotension during this recent admission. He was in A. fib with RVR. There was difficulty in getting rate control however he spontaneously converted. He is maintaining sinus rhythm since then. He is now on Eliquis and has followed up with Dr. Curt Bears for possible antiarrhythmic medication although since he is in sinus rhythm I do not see ongoing need for EP follow-up. With regards to his cardiomyopathy, his recent echo actually shows EF is up to 45-50%. He's currently on Toprol-XL. He has regained a lot of  his strength and feels fairly well with occasional speech problems.   PMHx:  Past Medical History  Diagnosis Date  . History of atrial fibrillation     in setting of endoscopy in 2011; resolved  . History of palpitations   . Atrial fibrillation with rapid ventricular response ([place]) 07/14/2015  . Stroke [facility])     Past Surgical History  Procedure Laterality Date  . Left and right heart catheterization with coronary angiogram N/A 07/23/2014    Procedure: LEFT AND RIGHT HEART CATHETERIZATION WITH CORONARY ANGIOGRAM;  Surgeon: [name], MD;  Location: [facility] CATH LAB;  Service: Cardiovascular;  Laterality: N/A;   . Radiology with anesthesia N/A 07/11/2015    Procedure: RADIOLOGY WITH ANESTHESIA;  Surgeon: [name], MD;  Location: [place];  Service: Radiology;  Laterality: N/A;    FAMHx:  Family History  Problem Relation Age of Onset  . Heart Problems Mother   . Cancer Father   . Stroke Father     SOCHx:   reports that he quit smoking about 42 years ago. His smokeless tobacco use includes Chew. He reports that he drinks about 0.6 oz of alcohol per week. He reports that he does not use illicit drugs.  ALLERGIES:  No Known Allergies  ROS: Pertinent items noted in HPI and remainder of comprehensive ROS otherwise negative.  HOME MEDS: Current Outpatient Prescriptions  Medication Sig Dispense Refill  . apixaban (ELIQUIS) 5 MG TABS tablet Take 1 tablet (5 mg total) by mouth 2 (two) times daily. 180 tablet 1  . atorvastatin (LIPITOR) 20 MG tablet Take 1 tablet (20 mg total) by mouth daily at 6 PM. 90 tablet 1  . feeding supplement, ENSURE ENLIVE, (ENSURE ENLIVE) LIQD Take 237 mLs by mouth 2 (two) times daily between meals. 237 mL 12  . metoprolol succinate (TOPROL-XL) 25 MG 24 hr tablet Take 1 tablet (25 mg total) by mouth daily. 90 tablet 1  . Multiple Vitamin (MULTIVITAMIN WITH MINERALS) TABS tablet Take 1 tablet by mouth daily.    . niacin 500 MG tablet Take 500 mg by mouth at bedtime.     No current facility-administered medications for this visit.    LABS/IMAGING: No results found for this or any previous visit (from the past 48 hour(s)). No results found.  VITALS: BP 102/66 mmHg  Pulse 73  Ht '5\' 8"'$  (1.727 m)  Wt 156 lb 3.2 oz (70.852 kg)  BMI 23.76 kg/m2  EXAM: General appearance: alert and no distress Neck: no carotid bruit and no JVD Lungs: clear to auscultation bilaterally Heart: regular rate and rhythm, S1, S2 normal, no murmur, click, rub or gallop Abdomen: soft, non-tender; bowel sounds normal; no masses,  no organomegaly Extremities: extremities normal,  atraumatic, no cyanosis or edema Pulses: 2+ and symmetric Skin: Skin color, texture, turgor normal. No rashes or lesions Neurologic: Grossly normal   EKG: Deferred  ASSESSMENT:  Nonischemic cardiomyopathy- (now improved to 45-50%), normal coronaries by recent catheterization  Leg pain - normal ABI's (no clear PAD)  Paroxysmal atrial fibrillation on aspirin (CHADSVASC score of 4)  Extensive former tobacco smoker  Recent stroke on Eliquis  PLAN: 1.   Phillip Solis unfortunately had recent stroke related to PAF. Previously he had remote PAF without recurrence and was on aspirin for CHADSVASC score of 1 given cardiomyopathy. He now has a CHADSVASC score 4 and is appropriately on Eliquis. Fortunately he has regained a lot of his deficits after stroke. He remains in sinus rhythm. He is no  longer on Entresto and blood pressure remains low normal at this point on Toprol-XL. We'll continue this medication. If there is additional blood pressure room I will consider restarting his Entresto.   Follow-up with me in 6 months.  [name], MD, [facility] Attending Cardiologist August C Stefon Ramthun 11/26/2015, 9:46 AM

## 2016-01-16 NOTE — Telephone Encounter (Signed)
Please close Encounter

## 2016-03-23 ENCOUNTER — Other Ambulatory Visit: Payer: Self-pay | Admitting: Cardiology

## 2016-03-24 NOTE — Telephone Encounter (Signed)
OK to refill under Dr Curt Bears

## 2016-03-24 NOTE — Telephone Encounter (Signed)
This was last refilled by patients neurologist, Dr Leonie Man. Should this be deferred back to Dr Leonie Man or okay to refill under Dr Curt Bears? Please advise. Thanks, MI

## 2016-04-12 ENCOUNTER — Encounter: Payer: Self-pay | Admitting: Neurology

## 2016-04-12 ENCOUNTER — Ambulatory Visit (INDEPENDENT_AMBULATORY_CARE_PROVIDER_SITE_OTHER): Payer: Medicare HMO | Admitting: Neurology

## 2016-04-12 VITALS — BP 131/77 | HR 79 | Wt 153.4 lb

## 2016-04-12 DIAGNOSIS — I6990 Unspecified sequelae of unspecified cerebrovascular disease: Secondary | ICD-10-CM

## 2016-04-12 DIAGNOSIS — I6529 Occlusion and stenosis of unspecified carotid artery: Secondary | ICD-10-CM | POA: Diagnosis not present

## 2016-04-12 NOTE — Progress Notes (Signed)
[facility] [address] [place]. [place] [address] [phone]       OFFICE FOLLOW-UP NOTE  [name] Date of Birth:  July 03, 1945 Medical Record Number:  [id number]   HPI: [name] is a 40 year Caucasian male seen today for first office follow-up visit following hospital admission for stroke in March 2017.Phillip Solis is a 70 y.o. male who was in his normal state of health when his wife was getting around 46 PM. On 07/11/15. He then went to lay down, and then heard a thump. She went to check on him and found him with right-sided weakness and difficulty speaking. EMS was called and code stroke was activated. Following head CT with no signs of hemorrhage, CT angiogram was performed given the signs of a likely large vessel ischemic stroke. IV TPA was initiated and interventional radiology was notified. He was recently diagnosed with CHF, with an EF 35-40%. He also has a history of atrial fibrillation and a single time after an endoscopy with no recurrence. Given a low chads-Vasc score, he was not started on anticoagulation. Though his recent diagnosis of CHF did raise it to 1, given that he did not have any recurrence in the interim since 2011 and it was only in the setting of a procedure he was not started on anticoagulation. 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 He was taken for emergent mechanical embolectomy which was performed successfully without by Dr. Estanislado Pandy using solitaire device and intra-arterial TPA. Patient was admitted to the intensive care unit where blood pressure was tightly controlled. Follow-up scan showed stable appearance and MRI scan showed patchy left MCA mostly subcortical infarct. Patient was extubated and did well. He had only mild expressive  aphasia and some diminished fine motor skills and right-sided weakness. He was seen by physical occupational therapy and initial plan was to send him to inpatient rehabilitation but patient was doing well and he wanted to go home and refused rehabilitation transfer. He was started on eliquis for anticoagulation for atrial fibrillation and medications for blood pressure and cholesterol. Patient states his done well his finished outpatient physical occupational therapy. His speech is completely back to normal. On he occasionally when his diet he may have some word finding difficulties or drag his right leg. He is returned back to August activities of daily living. Is quite active and walks every day. Is tolerating eliquis without bleeding or bruising. States his blood pressure is usually well controlled though it is slightly elevated in office today. Is tolerating Lipitor well without muscle aches or pains. He has an appointment to see his cardiologist in a few weeks. He is nearly out of his eliquis and is requesting a refill. Update 04/12/16 : He returns for follow-up after last 6 months ago. Continues to do well without recurrent stroke or TIA symptoms. He is starting eliquis well without bleeding or bruising. States his blood pressure is also well controlled and today it is 131 / 77. His tolerating Lipitor well without muscle aches and pains. He'll return back to his baseline and is fully independent in all activities. He has no new complaints today. He has not had follow-up carotid ultrasound done since his stroke in March 2017  ROS:   14 system review of systems  is positive for no complaints today and all other  systems negative  PMH:  Past Medical History:  Diagnosis Date  . Atrial fibrillation with rapid ventricular response ([place]) 07/14/2015  . History of atrial fibrillation    in setting of endoscopy in 2011; resolved  . History of palpitations   . Stroke [facility])     Social History:  Social History     Social History  . Marital status: Married    Spouse name: N/A  . Number of children: 2  . Years of education: N/A   Occupational History  . Not on file.   Social History Main Topics  . Smoking status: Former Smoker    Quit date: 03/19/1973  . Smokeless tobacco: Current User    Types: Chew  . Alcohol use 0.6 oz/week    1 Cans of beer per week     Comment: occasional beer  . Drug use: No  . Sexual activity: Not on file   Other Topics Concern  . Not on file   Social History Narrative  . No narrative on file    Medications:   Current Outpatient Prescriptions on File Prior to Visit  Medication Sig Dispense Refill  . apixaban (ELIQUIS) 5 MG TABS tablet Take 1 tablet (5 mg total) by mouth 2 (two) times daily. 180 tablet 1  . atorvastatin (LIPITOR) 20 MG tablet Take 1 tablet (20 mg total) by mouth daily at 6 PM. 90 tablet 1  . feeding supplement, ENSURE ENLIVE, (ENSURE ENLIVE) LIQD Take 237 mLs by mouth 2 (two) times daily between meals. 237 mL 12  . metoprolol succinate (TOPROL-XL) 25 MG 24 hr tablet Take 1 tablet (25 mg total) by mouth daily. 90 tablet 1  . Multiple Vitamin (MULTIVITAMIN WITH MINERALS) TABS tablet Take 1 tablet by mouth daily.    . niacin 500 MG tablet Take 500 mg by mouth at bedtime.     No current facility-administered medications on file prior to visit.     Allergies:  No Known Allergies  Physical Exam General: well developed, well nourished Elderly Caucasian male, seated, in no evident distress Head: head normocephalic and atraumatic.  Neck: supple with no carotid or supraclavicular bruits Cardiovascular: regular rate and rhythm, no murmurs Musculoskeletal: no deformity Skin:  no rash/petichiae Vascular:  Normal pulses all extremities Vitals:   04/12/16 0926  BP: 131/77  Pulse: 79   Neurologic Exam Mental Status: Awake and fully alert. Oriented to place and time. Recent and remote memory intact. Attention span, concentration and fund of  knowledge appropriate. Mood and affect appropriate.  Cranial Nerves: Fundoscopic exam not done. Pupils equal, briskly reactive to light. Extraocular movements full without nystagmus. Visual fields full to confrontation. Hearing intact. Facial sensation intact. Face, tongue, palate moves normally and symmetrically.  Motor: Normal bulk and tone. Normal strength in all tested extremity muscles.Diminished fine finger movements on the right. Orbits left over right upper extremity. Sensory.: intact to touch ,pinprick .position and vibratory sensation.  Coordination: Rapid alternating movements normal in all extremities. Finger-to-nose and heel-to-shin performed accurately bilaterally. Gait and Station: Arises from chair without difficulty. Stance is normal. Gait demonstrates normal stride length and balance . Able to heel, toe and tandem walk without difficulty.  Reflexes: 1+ and symmetric. Toes downgoing.   NIHSS  0 Modified Rankin  1   ASSESSMENT: 50 year Caucasian male with embolic left MCA infarct in March 2017 due to left middle cerebral artery occlusion from a to fibrillation status post mechanical  embolectomy with solitaire device and intra-arterial TPA with complete recanalization. Vascular risk factors of hypertension, hyperlipidemia and atrial fibrillation. Patient is doing clinically quite well with near complete recovery ( NIHSS 21 on admisssion to 0 now)    PLAN: I had a long d/w patient about his remote stroke, atrial fibrillation,risk for recurrent stroke/TIAs, personally independently reviewed imaging studies and stroke evaluation results and answered questions.Continue Eliquis (apixaban) daily  for secondary stroke prevention and maintain strict control of hypertension with blood pressure goal below 130/90, diabetes with hemoglobin A1c goal below 6.5% and lipids with LDL cholesterol goal below 70 mg/dL. check follow-up carotid ultrasound study. I also advised the patient to eat a healthy  diet with plenty of whole grains, cereals, fruits and vegetables, exercise regularly and maintain ideal body weight Followup in the future with me in  1 year or call earlier if necessary.reater than 50% of time during this 25 minute visit was spent on counseling,explanation of diagnosis, planning of further management, discussion with patient and family and coordination of care Antony Contras, MD  [facility] [address] [place], [place] [address]  Phone [phone] Fax [phone] Note: This document was prepared with digital dictation and possible smart phrase technology. Any transcriptional errors that result from this process are unintentional

## 2016-04-26 ENCOUNTER — Telehealth: Payer: Self-pay | Admitting: Neurology

## 2016-04-26 NOTE — Telephone Encounter (Signed)
Called and left message for patient to call me back about getting Carotid Doppler scheduled . No Pa needed Reference [id number].

## 2016-05-06 NOTE — Telephone Encounter (Signed)
Spoke  to Patient he is scheduled for Doppler . Thanks Hinton Dyer.

## 2016-05-12 ENCOUNTER — Ambulatory Visit (INDEPENDENT_AMBULATORY_CARE_PROVIDER_SITE_OTHER): Payer: Medicare HMO

## 2016-05-12 DIAGNOSIS — I6990 Unspecified sequelae of unspecified cerebrovascular disease: Secondary | ICD-10-CM | POA: Diagnosis not present

## 2016-05-12 DIAGNOSIS — I6529 Occlusion and stenosis of unspecified carotid artery: Secondary | ICD-10-CM

## 2016-05-13 ENCOUNTER — Other Ambulatory Visit: Payer: Self-pay | Admitting: *Deleted

## 2016-05-13 ENCOUNTER — Other Ambulatory Visit: Payer: Self-pay | Admitting: Cardiology

## 2016-05-13 MED ORDER — METOPROLOL SUCCINATE ER 25 MG PO TB24
25.0000 mg | ORAL_TABLET | Freq: Every day | ORAL | 1 refills | Status: DC
Start: 2016-05-13 — End: 2016-11-22

## 2016-05-13 MED ORDER — ATORVASTATIN CALCIUM 20 MG PO TABS: 20.0000 mg | ORAL_TABLET | Freq: Every day | ORAL | 1 refills | Status: DC

## 2016-05-21 ENCOUNTER — Telehealth: Payer: Self-pay | Admitting: *Deleted

## 2016-05-21 MED ORDER — APIXABAN 5 MG PO TABS: 5.0000 mg | ORAL_TABLET | Freq: Two times a day (BID) | ORAL | 0 refills | Status: DC

## 2016-05-21 NOTE — Telephone Encounter (Signed)
Patient came by office . States he took last tablet of eliquis  Today. It will cost @$242  For prescription  gave patient free saving card- to see if it will give him the medication and 14 day supply samples  Patient called back unable to use savings card . rn informed patient he has enough medication for 1 week . Will defer to Dr Debara Pickett.   also ask patient to contact insurance see what is on formulary tier 1

## 2016-05-26 NOTE — Telephone Encounter (Signed)
Refer to Phillip Solis

## 2016-05-27 MED ORDER — WARFARIN SODIUM 5 MG PO TABS: 5.0000 mg | ORAL_TABLET | Freq: Every day | ORAL | 1 refills | Status: DC

## 2016-05-27 NOTE — Telephone Encounter (Signed)
Spoke with patient and wife. Cannot afford the cost of Eliquis at this time.  He has 3 tablets remaining.  He will go to [facility] today to get warfarin and start 5 mg daily.  Stressed that because of weather, he should only go out if he fees the roads are safe.    Scheduled appt for Monday for first INR.

## 2016-05-31 ENCOUNTER — Telehealth: Payer: Self-pay

## 2016-05-31 ENCOUNTER — Ambulatory Visit (INDEPENDENT_AMBULATORY_CARE_PROVIDER_SITE_OTHER): Payer: Medicare HMO | Admitting: Pharmacist Clinician (PhC)/ Clinical Pharmacy Specialist

## 2016-05-31 DIAGNOSIS — Z7901 Long term (current) use of anticoagulants: Secondary | ICD-10-CM

## 2016-05-31 DIAGNOSIS — Z5181 Encounter for therapeutic drug level monitoring: Secondary | ICD-10-CM | POA: Diagnosis not present

## 2016-05-31 DIAGNOSIS — I4891 Unspecified atrial fibrillation: Secondary | ICD-10-CM

## 2016-05-31 LAB — POCT INR: INR: 2.2

## 2016-05-31 NOTE — Telephone Encounter (Signed)
Dr. Leonie Man reviewed pts carotid duplex study.The patients study was normal per Dr. Leonie Man note.

## 2016-06-01 NOTE — Telephone Encounter (Signed)
RN call patient back to give results of carotid duplex. Rn stated it was negative and normal. Pt verbalized understanding.

## 2016-06-07 ENCOUNTER — Ambulatory Visit (INDEPENDENT_AMBULATORY_CARE_PROVIDER_SITE_OTHER): Payer: Medicare HMO | Admitting: Pharmacist Clinician (PhC)/ Clinical Pharmacy Specialist

## 2016-06-07 DIAGNOSIS — I4891 Unspecified atrial fibrillation: Secondary | ICD-10-CM

## 2016-06-07 DIAGNOSIS — Z7901 Long term (current) use of anticoagulants: Secondary | ICD-10-CM | POA: Diagnosis not present

## 2016-06-07 DIAGNOSIS — Z5181 Encounter for therapeutic drug level monitoring: Secondary | ICD-10-CM

## 2016-06-07 LAB — POCT INR: INR: 4.3

## 2016-06-14 ENCOUNTER — Ambulatory Visit (INDEPENDENT_AMBULATORY_CARE_PROVIDER_SITE_OTHER): Payer: Medicare HMO | Admitting: Pharmacist

## 2016-06-14 DIAGNOSIS — Z5181 Encounter for therapeutic drug level monitoring: Secondary | ICD-10-CM | POA: Diagnosis not present

## 2016-06-14 DIAGNOSIS — I4891 Unspecified atrial fibrillation: Secondary | ICD-10-CM | POA: Diagnosis not present

## 2016-06-14 DIAGNOSIS — Z7901 Long term (current) use of anticoagulants: Secondary | ICD-10-CM | POA: Diagnosis not present

## 2016-06-14 LAB — POCT INR: INR: 2

## 2016-06-21 ENCOUNTER — Ambulatory Visit (INDEPENDENT_AMBULATORY_CARE_PROVIDER_SITE_OTHER): Payer: Medicare HMO | Admitting: Pharmacist

## 2016-06-21 DIAGNOSIS — Z5181 Encounter for therapeutic drug level monitoring: Secondary | ICD-10-CM | POA: Diagnosis not present

## 2016-06-21 DIAGNOSIS — Z7901 Long term (current) use of anticoagulants: Secondary | ICD-10-CM

## 2016-06-21 DIAGNOSIS — I4891 Unspecified atrial fibrillation: Secondary | ICD-10-CM

## 2016-06-21 LAB — POCT INR: INR: 1.3

## 2016-06-21 NOTE — Patient Instructions (Signed)
Vitamin K Foods and Warfarin Warfarin is a blood thinner (anticoagulant). Anticoagulant medicines help prevent the formation of blood clots. These medicines work by decreasing the activity of vitamin K, which promotes normal blood clotting. When you take warfarin, problems can occur from suddenly increasing or decreasing the amount of vitamin K that you eat from one day to the next. Problems may include:  Blood clots.  Bleeding. What general guidelines do I need to follow? To avoid problems when taking warfarin:  Eat a balanced diet that includes:  Fresh fruits and vegetables.  Whole grains.  Low-fat dairy products.  Lean proteins, such as fish, eggs, and lean cuts of meat.  Keep your intake of vitamin K consistent from day to day. To do this:  Avoid eating large amounts of vitamin K one day and low amounts of vitamin K the next day.  If you take a multivitamin that contains vitamin K, be sure to take it every day.  Know which foods contain vitamin K. Use the lists below to understand serving sizes and the amount of vitamin K in one serving.  Avoid major changes in your diet. If you are going to change your diet, talk with your health care provider before making changes.  Work with a Financial planner (dietitian) to develop a meal plan that works best for you. High vitamin K foods Foods that are high in vitamin K contain more than 100 mcg (micrograms) per serving. These include:  Broccoli (cooked) -  cup has 110 mcg.  Brussels sprouts (cooked) -  cup has 109 mcg.  Greens, beet (cooked) -  cup has 350 mcg.  Greens, collard (cooked) -  cup has 418 mcg.  Greens, turnip (cooked) -  cup has 265 mcg.  Green onions or scallions -  cup has 105 mcg.  Kale (fresh or frozen) -  cup has 531 mcg.  Parsley (raw) - 10 sprigs has 164 mcg.  Spinach (cooked) -  cup has 444 mcg.  Swiss chard (cooked) -  cup has 287 mcg. Moderate vitamin K foods Foods that have a  moderate amount of vitamin K contain 25-100 mcg per serving. These include:  Asparagus (cooked) - 5 spears have 38 mcg.  Black-eyed peas (dried) -  cup has 32 mcg.  Cabbage (cooked) -  cup has 37 mcg.  Kiwi fruit - 1 medium has 31 mcg.  Lettuce - 1 cup has 57-63 mcg.  Okra (frozen) -  cup has 44 mcg.  Prunes (dried) - 5 prunes have 25 mcg.  Watercress (raw) - 1 cup has 85 mcg. Low vitamin K foods Foods low in vitamin K contain less than 25 mcg per serving. These include:  Artichoke - 1 medium has 18 mcg.  Avocado - 1 oz. has 6 mcg.  Blueberries -  cup has 14 mcg.  Cabbage (raw) -  cup has 21 mcg.  Carrots (cooked) -  cup has 11 mcg.  Cauliflower (raw) -  cup has 11 mcg.  Cucumber with peel (raw) -  cup has 9 mcg.  Grapes -  cup has 12 mcg.  Mango - 1 medium has 9 mcg.  Nuts - 1 oz. has 15 mcg.  Pear - 1 medium has 8 mcg.  Peas (cooked) -  cup has 19 mcg.  Pickles - 1 spear has 14 mcg.  Pumpkin seeds - 1 oz. has 13 mcg.  Sauerkraut (canned) -  cup has 16 mcg.  Soybeans (cooked) -  cup has 16 mcg.  Tomato (raw) - 1 medium has 10 mcg.  Tomato sauce -  cup has 17 mcg. Vitamin K-free foods If a food contain less than 5 mcg per serving, it is considered to have no vitamin K. These foods include:  Bread and cereal products.  Cheese.  Eggs.  Fish and shellfish.  Meat and poultry.  Milk and dairy products.  Sunflower seeds. Actual amounts of vitamin K in foods may be different depending on processing. Talk with your dietitian about what foods you can eat and what foods you should avoid. This information is not intended to replace advice given to you by your health care provider. Make sure you discuss any questions you have with your health care provider. Document Released: 02/21/2009 Document Revised: 11/16/2015 Document Reviewed: 07/30/2015 Elsevier Interactive Patient Education  2017 [employer].

## 2016-07-05 ENCOUNTER — Ambulatory Visit (INDEPENDENT_AMBULATORY_CARE_PROVIDER_SITE_OTHER): Payer: Medicare HMO | Admitting: Pharmacist Clinician (PhC)/ Clinical Pharmacy Specialist

## 2016-07-05 DIAGNOSIS — Z5181 Encounter for therapeutic drug level monitoring: Secondary | ICD-10-CM | POA: Diagnosis not present

## 2016-07-05 DIAGNOSIS — I4891 Unspecified atrial fibrillation: Secondary | ICD-10-CM | POA: Diagnosis not present

## 2016-07-05 DIAGNOSIS — Z7901 Long term (current) use of anticoagulants: Secondary | ICD-10-CM | POA: Diagnosis not present

## 2016-07-05 LAB — POCT INR: INR: 1.6

## 2016-07-14 ENCOUNTER — Telehealth: Payer: Self-pay | Admitting: Internal Medicine

## 2016-07-14 NOTE — Telephone Encounter (Signed)
Did not need this encounter °

## 2016-07-16 ENCOUNTER — Encounter: Payer: Self-pay | Admitting: Physician Assistant

## 2016-07-16 ENCOUNTER — Ambulatory Visit (INDEPENDENT_AMBULATORY_CARE_PROVIDER_SITE_OTHER): Payer: Medicare HMO | Admitting: Physician Assistant

## 2016-07-16 ENCOUNTER — Ambulatory Visit (INDEPENDENT_AMBULATORY_CARE_PROVIDER_SITE_OTHER): Payer: Medicare HMO | Admitting: Pharmacist Clinician (PhC)/ Clinical Pharmacy Specialist

## 2016-07-16 VITALS — BP 114/70 | HR 82 | Ht 66.0 in | Wt 152.0 lb

## 2016-07-16 DIAGNOSIS — I4891 Unspecified atrial fibrillation: Secondary | ICD-10-CM | POA: Diagnosis not present

## 2016-07-16 DIAGNOSIS — E785 Hyperlipidemia, unspecified: Secondary | ICD-10-CM | POA: Diagnosis not present

## 2016-07-16 DIAGNOSIS — Z8673 Personal history of transient ischemic attack (TIA), and cerebral infarction without residual deficits: Secondary | ICD-10-CM

## 2016-07-16 DIAGNOSIS — Z7901 Long term (current) use of anticoagulants: Secondary | ICD-10-CM | POA: Diagnosis not present

## 2016-07-16 DIAGNOSIS — I480 Paroxysmal atrial fibrillation: Secondary | ICD-10-CM | POA: Diagnosis not present

## 2016-07-16 DIAGNOSIS — Z5181 Encounter for therapeutic drug level monitoring: Secondary | ICD-10-CM | POA: Diagnosis not present

## 2016-07-16 LAB — POCT INR: INR: 2.3

## 2016-07-16 NOTE — Progress Notes (Signed)
Cardiology Office Note    Date:  07/16/2016   ID:  Phillip Solis, DOB Feb 20, 1946, MRN [id number]  PCP:  Phillip Lower, MD  Cardiologist:  Dr. Debara Solis  Electrophysiologist: Dr. Curt Solis  Chief Complaint  Patient presents with  . Follow-up    seen for Dr. Debara Solis, 6 month;  Pt states no Sx.     History of Present Illness:  Phillip Solis is a 71 y.o. male with PMH of CVA and atrial fibrillation since 2011. He was first diagnosed with atrial fibrillation in 2011 in the setting of vomiting and endoscopy to remove food particle which was stuck in his throat. He spontaneously converted and had no recurrence for a long time. He occasionally does have palpitation and is maintained on bisoprolol 5 mg twice a day. He also take aspirin due to his low CHA2DS2-Vasc score.  He was later seen in the office complaining of some shortness of breath with exertion and pain in the leg with walking as well. A stress test, abdominal ultrasound and peripheral Doppler was ordered, however as patient lost his insurance and stopped working, he did not do any of those. He later reestablished care after he started on Medicare with [employer] supplement. His Solis extremity ABI was normal on 06/21/2014. Abdominal ultrasound was also normal. Echocardiogram obtained on 06/21/2014 however showed EF 35-40% with moderate MR. Due to concern of CAD, he underwent left and right heart cath on 07/23/2014, this showed normal coronaries, wedge pressure 4, RV pressure 22/2. Afterward, he was started [place].   He suffered a stroke in March 2017 involving the left middle cerebral artery territory treated with IV TPA and mechanical thrombectomy, he was in atrial fibrillation with RVR at the time. Phillip Solis was stopped during that admission possibly due to concern of hypotension. He has been seen by Dr. Curt Solis and started on eliquis, previous aspirin was discontinued. He is also on Toprol-XL for rate control. He had to repeat echocardiogram since March  2016, the most recent echocardiogram obtained on 07/13/2015 demonstrated EF improved ejection fraction to 45-50%, akinesis of the basal mid inferolateral myocardium, grade 1 diastolic dysfunction, mild MR. Based on the phone note on 05/21/2016, he was having problems affording her eliquis, therefore he has been transitioned to Coumadin and followed up in our Coumadin clinic.  Since switching to Coumadin, he has been doing well, he says Coumadin only cost him $5 whereas previous eliquis was going to cost more than $400. His INR was subtherapeutic in February, I have asked him to pay closer attention to the Coumadin doses. He says he has been compliant with that. Otherwise he has not had any further issue. Occasionally he still has some right Solis extremity problem with walking after the previous stroke, however this is more intermittent and it does not interfere with his normal activity. He has not experienced palpitations either. He has been compliant with his medications. I will continue on the current medications as time. I did not obtain an EKG today, however based on physical exam, his heart rate is very regular, and likely in sinus rhythm at this time. He has not had a fasting lipid panel in a year, however he is not fasting today. I will set him up for six-month follow-up, but he will obtain a fasting lipid panel prior to the visit.    Past Medical History:  Diagnosis Date  . Atrial fibrillation with rapid ventricular response ([place]) 07/14/2015  . History of atrial fibrillation    in setting  of endoscopy in 2011; resolved  . History of palpitations   . Stroke [facility])     Past Surgical History:  Procedure Laterality Date  . LEFT AND RIGHT HEART CATHETERIZATION WITH CORONARY ANGIOGRAM Phillip Solis 07/23/2014   Procedure: LEFT AND RIGHT HEART CATHETERIZATION WITH CORONARY ANGIOGRAM;  Surgeon: Phillip Casino, MD;  Location: [facility] CATH LAB;  Service: Cardiovascular;  Laterality: Phillip Solis;  . RADIOLOGY WITH ANESTHESIA  Phillip Solis 07/11/2015   Procedure: RADIOLOGY WITH ANESTHESIA;  Surgeon: Phillip Bras, MD;  Location: [place];  Service: Radiology;  Laterality: Phillip Solis;    Current Medications: Outpatient Medications Prior to Visit  Medication Sig Dispense Refill  . atorvastatin (LIPITOR) 20 MG tablet Take 1 tablet (20 mg total) by mouth daily at 6 PM. 90 tablet 1  . feeding supplement, ENSURE ENLIVE, (ENSURE ENLIVE) LIQD Take 237 mLs by mouth 2 (two) times daily between meals. 237 mL 12  . metoprolol succinate (TOPROL-XL) 25 MG 24 hr tablet Take 1 tablet (25 mg total) by mouth daily. 90 tablet 1  . Multiple Vitamin (MULTIVITAMIN WITH MINERALS) TABS tablet Take 1 tablet by mouth daily.    . niacin 500 MG tablet Take 500 mg by mouth at bedtime.    Marland Kitchen warfarin (COUMADIN) 5 MG tablet Take 1 tablet (5 mg total) by mouth daily. 30 tablet 1   No facility-administered medications prior to visit.      Allergies:   Patient has no known allergies.   Social History   Social History  . Marital status: Married    Spouse name: Phillip Solis  . Number of children: 2  . Years of education: Phillip Solis   Social History Main Topics  . Smoking status: Former Smoker    Quit date: 03/19/1973  . Smokeless tobacco: Current User    Types: Chew  . Alcohol use 0.6 oz/week    1 Cans of beer per week     Comment: occasional beer  . Drug use: No  . Sexual activity: Not Asked   Other Topics Concern  . None   Social History Narrative  . None     Family History:  The patient's family history includes Cancer in his father; Heart Problems in his mother; Stroke in his father.   ROS:   Please see the history of present illness.    ROS All other systems reviewed and are negative.   PHYSICAL EXAM:   VS:  BP 114/70   Pulse 82   Ht '5\' 6"'$  (1.676 m)   Wt 152 lb (68.9 kg)   BMI 24.53 kg/m    GEN: Well nourished, well developed, in no acute distress  HEENT: normal  Neck: no JVD, carotid bruits, or masses Cardiac: RRR; no murmurs, rubs, or  gallops,no edema  Respiratory:  clear to auscultation bilaterally, normal work of breathing GI: soft, nontender, nondistended, + BS MS: no deformity or atrophy  Skin: warm and dry, no rash Neuro:  Alert and Oriented x 3, Strength and sensation are intact Psych: euthymic mood, full affect  Wt Readings from Last 3 Encounters:  07/16/16 152 lb (68.9 kg)  04/12/16 153 lb 6.4 oz (69.6 kg)  11/26/15 156 lb 3.2 oz (70.9 kg)      Studies/Labs Reviewed:   EKG:  EKG is not ordered today.  He is in sinus rhythm based on physical exam.  Recent Labs: No results found for requested labs within last 8760 hours.   Lipid Panel    Component Value Date/Time   CHOL 182 07/12/2015  0558   TRIG 209 (H) 07/12/2015 0558   TRIG 207 (H) 07/12/2015 0558   HDL 21 (L) 07/12/2015 0558   CHOLHDL 8.7 07/12/2015 0558   VLDL 41 (H) 07/12/2015 0558   LDLCALC 120 (H) 07/12/2015 0558    Additional studies/ records that were reviewed today include:   Echo 06/21/2014 LV EF: 35% -  40%  - Left ventricle: The cavity size was normal. Wall thickness was increased in a pattern of mild LVH. Systolic function was moderately reduced. The estimated ejection fraction was in the range of 35% to 40%. Features are consistent with a pseudonormal left ventricular filling pattern, with concomitant abnormal relaxation and increased filling pressure (grade 2 diastolic dysfunction). - Mitral valve: There was moderate regurgitation. - Left atrium: The atrium was mildly dilated.   Cath 07/23/2014 Left Ventriculography:             EF:  35-40%             Wall Motion: global hypokinesis  Right Heart Data:  RA - 2    RV - 22/2  PA - 20/5 (12)   PCWP - 4  TPG - 8  FCO/CI - 3.92 L/min, 2.24 L/min  TDCO/CI - 5.94 L/min, 3.39 L/min  SVR - 22 Wood units ( dynscm?5/80)  PVR - 2.04 Wood units ( dynscm?5/80)  PA Sat% - 66  AO Sat% - 97  Coronary Angiographic Data:  Left Main:   Normal  Left Anterior Descending (LAD):  Angiographically normal - smaller caliber, tapers around the apex.  1st diagonal (D1):  Moderate-sized vessel, no stenosis.  2nd diagonal (D2):  Small to moderate-sized, no stenosis.  Circumflex (LCx):  Angiographically normal, follows the AV groove and gives off a single mid-vessel OM vessel.  1st obtuse marginal:  Angiographically normal  Right Coronary Artery: Dominant vessel. Larger caliber. No stenosis.  right ventricle branch of right coronary artery: normal  posterior descending artery: No stenosis  posterior lateral branch:  No stenosis  Impression: 1.  Angiographically normal coronary arteries 2.  Severe global hypokinesis, LVEF 35-40% 3.  Compensated right and left heart filling pressures 4.  Moderately decreased cardiac output  Plan: 1.  Medical therapy for non-ischemic cardiomyopathy - will likely add Entresto when he is out of the risk of contrast nephropathy. Continue bisoprolol. 2.  Given history of PAF (previously on ASA), his CHADSVASC score is now 1-2 - will need to consider starting a NOAC. 3.  Medication adjustment at follow-up with me in 2 weeks.     Echo 07/13/2015 LV EF: 45% -   50%  - Left ventricle: The cavity size was normal. Systolic function was   mildly reduced. The estimated ejection fraction was in the range   of 45% to 50%. There is akinesis of the basal-midinferolateral   myocardium. Doppler parameters are consistent with abnormal left   ventricular relaxation (grade 1 diastolic dysfunction). - Aortic valve: There was trivial regurgitation. - Mitral valve: There was mild regurgitation.  Impressions:  - EF improved from prior. No cardiac source of emboli was   indentified.   ASSESSMENT:    1. Paroxysmal atrial fibrillation (HCC)   2. Hyperlipidemia, unspecified hyperlipidemia type   3. H/O: CVA (cerebrovascular accident)      PLAN:  In order of problems listed  above:  1. Paroxysmal atrial fibrillation: No obvious recurrence. Used to be on eliquis, however due to cost issue, he has been transitioned to Coumadin since January 2018. In February, he did  have some subtherapeutic INR. His INR today is therapeutic. He has not had any recurrent stroke. I will continue him on the current medication of Toprol-XL.  2. Hyperlipidemia: On Lipitor, last lipid panel March 2017 which showed total cholesterol 182, triglycerides 207, HDL 21, LDL 120. He will need a fasting lipid panel prior to his next visit. He is not fasting today.  3. History of CVA: Left MCA stroke in the setting of atrial fibrillation. Continue on Coumadin. He has not had any recurrence. He did have some subtherapeutic INR in February, at told him he will need to make sure his INR within therapeutic range.   Medication Adjustments  Current medicines are reviewed at length with the patient today.  Concerns regarding medicines are outlined above.  Medication changes, Labs and Tests ordered today are listed in the Patient Instructions below. Patient Instructions  Medication Instructions:   Your physician recommends that you continue on your current medications as directed. Please refer to the Current Medication list given to you today.    If you need a refill on your cardiac medications before your next appointment, please call your pharmacy.  Labwork: RETURN A FEW DAYS BEFORE 6 MONTH FOLLOW UP  FASTING LIPID    Testing/Procedures:  NONE ORDERED  TODAY    Follow-Up: IN 6 TO 9 MONTHS  WITH DR HILTY    Any Other Special Instructions Will Be Listed Below (If Applicable).                                                                                                                                                      Hilbert Corrigan, [place]  07/16/2016 5:12 PM    [facility] [place], [place], [place]  [address] Phone: [phone]; Fax: [phone]

## 2016-07-16 NOTE — Patient Instructions (Addendum)
Medication Instructions:   Your physician recommends that you continue on your current medications as directed. Please refer to the Current Medication list given to you today.    If you need a refill on your cardiac medications before your next appointment, please call your pharmacy.  Labwork: RETURN A FEW DAYS BEFORE 6 MONTH FOLLOW UP  FASTING LIPID    Testing/Procedures:  NONE ORDERED  TODAY    Follow-Up: IN 6 TO 9 MONTHS  WITH DR HILTY    Any Other Special Instructions Will Be Listed Below (If Applicable).

## 2016-08-02 ENCOUNTER — Other Ambulatory Visit: Payer: Self-pay | Admitting: Pharmacist Clinician (PhC)/ Clinical Pharmacy Specialist

## 2016-08-02 ENCOUNTER — Ambulatory Visit (INDEPENDENT_AMBULATORY_CARE_PROVIDER_SITE_OTHER): Payer: Medicare HMO | Admitting: Pharmacist Clinician (PhC)/ Clinical Pharmacy Specialist

## 2016-08-02 DIAGNOSIS — Z7901 Long term (current) use of anticoagulants: Secondary | ICD-10-CM

## 2016-08-02 DIAGNOSIS — I4891 Unspecified atrial fibrillation: Secondary | ICD-10-CM | POA: Diagnosis not present

## 2016-08-02 DIAGNOSIS — Z5181 Encounter for therapeutic drug level monitoring: Secondary | ICD-10-CM | POA: Diagnosis not present

## 2016-08-02 LAB — POCT INR: INR: 1.9

## 2016-08-02 MED ORDER — WARFARIN SODIUM 5 MG PO TABS: ORAL_TABLET | ORAL | 3 refills | Status: DC

## 2016-08-16 ENCOUNTER — Ambulatory Visit (INDEPENDENT_AMBULATORY_CARE_PROVIDER_SITE_OTHER): Payer: Medicare HMO | Admitting: Pharmacist

## 2016-08-16 ENCOUNTER — Other Ambulatory Visit: Payer: Self-pay | Admitting: Pharmacist

## 2016-08-16 DIAGNOSIS — Z7901 Long term (current) use of anticoagulants: Secondary | ICD-10-CM

## 2016-08-16 DIAGNOSIS — I4891 Unspecified atrial fibrillation: Secondary | ICD-10-CM | POA: Diagnosis not present

## 2016-08-16 DIAGNOSIS — Z5181 Encounter for therapeutic drug level monitoring: Secondary | ICD-10-CM

## 2016-08-16 LAB — POCT INR: INR: 4.8

## 2016-08-16 MED ORDER — WARFARIN SODIUM 5 MG PO TABS: ORAL_TABLET | ORAL | 0 refills | Status: DC

## 2016-08-16 MED ORDER — ATORVASTATIN CALCIUM 20 MG PO TABS: 20.0000 mg | ORAL_TABLET | Freq: Every day | ORAL | 1 refills | Status: DC

## 2016-08-30 ENCOUNTER — Ambulatory Visit (INDEPENDENT_AMBULATORY_CARE_PROVIDER_SITE_OTHER): Payer: Medicare HMO | Admitting: Pharmacist Clinician (PhC)/ Clinical Pharmacy Specialist

## 2016-08-30 DIAGNOSIS — I4891 Unspecified atrial fibrillation: Secondary | ICD-10-CM

## 2016-08-30 DIAGNOSIS — Z5181 Encounter for therapeutic drug level monitoring: Secondary | ICD-10-CM | POA: Diagnosis not present

## 2016-08-30 DIAGNOSIS — Z7901 Long term (current) use of anticoagulants: Secondary | ICD-10-CM | POA: Diagnosis not present

## 2016-08-30 LAB — POCT INR: INR: 3.1

## 2016-09-13 ENCOUNTER — Ambulatory Visit (INDEPENDENT_AMBULATORY_CARE_PROVIDER_SITE_OTHER): Payer: Medicare HMO | Admitting: Pharmacist

## 2016-09-13 DIAGNOSIS — Z7901 Long term (current) use of anticoagulants: Secondary | ICD-10-CM

## 2016-09-13 DIAGNOSIS — Z5181 Encounter for therapeutic drug level monitoring: Secondary | ICD-10-CM | POA: Diagnosis not present

## 2016-09-13 DIAGNOSIS — I4891 Unspecified atrial fibrillation: Secondary | ICD-10-CM

## 2016-09-13 LAB — POCT INR: INR: 2.9

## 2016-10-05 ENCOUNTER — Ambulatory Visit (INDEPENDENT_AMBULATORY_CARE_PROVIDER_SITE_OTHER): Payer: Medicare HMO | Admitting: Pharmacist Clinician (PhC)/ Clinical Pharmacy Specialist

## 2016-10-05 DIAGNOSIS — I4891 Unspecified atrial fibrillation: Secondary | ICD-10-CM

## 2016-10-05 DIAGNOSIS — Z5181 Encounter for therapeutic drug level monitoring: Secondary | ICD-10-CM

## 2016-10-05 DIAGNOSIS — Z7901 Long term (current) use of anticoagulants: Secondary | ICD-10-CM

## 2016-10-05 LAB — POCT INR: INR: 2.4

## 2016-10-28 ENCOUNTER — Encounter (HOSPITAL_COMMUNITY): Payer: Self-pay | Admitting: Emergency Medicine

## 2016-10-28 ENCOUNTER — Emergency Department (HOSPITAL_COMMUNITY): Payer: Medicare HMO

## 2016-10-28 ENCOUNTER — Inpatient Hospital Stay (HOSPITAL_COMMUNITY)
Admission: EM | Admit: 2016-10-28 | Discharge: 2016-11-01 | DRG: 065 | Disposition: A | Discharge status: Home / Self Care | Payer: Medicare HMO | Attending: Oncology | Admitting: Oncology

## 2016-10-28 DIAGNOSIS — S065X9A Traumatic subdural hemorrhage with loss of consciousness of unspecified duration, initial encounter: Secondary | ICD-10-CM

## 2016-10-28 DIAGNOSIS — I6200 Nontraumatic subdural hemorrhage, unspecified: Principal | ICD-10-CM | POA: Diagnosis present

## 2016-10-28 DIAGNOSIS — I428 Other cardiomyopathies: Secondary | ICD-10-CM | POA: Diagnosis not present

## 2016-10-28 DIAGNOSIS — I480 Paroxysmal atrial fibrillation: Secondary | ICD-10-CM | POA: Diagnosis present

## 2016-10-28 DIAGNOSIS — T45525A Adverse effect of antithrombotic drugs, initial encounter: Secondary | ICD-10-CM

## 2016-10-28 DIAGNOSIS — Z79899 Other long term (current) drug therapy: Secondary | ICD-10-CM | POA: Diagnosis not present

## 2016-10-28 DIAGNOSIS — I429 Cardiomyopathy, unspecified: Secondary | ICD-10-CM | POA: Diagnosis present

## 2016-10-28 DIAGNOSIS — Z8673 Personal history of transient ischemic attack (TIA), and cerebral infarction without residual deficits: Secondary | ICD-10-CM | POA: Diagnosis not present

## 2016-10-28 DIAGNOSIS — T45525D Adverse effect of antithrombotic drugs, subsequent encounter: Secondary | ICD-10-CM | POA: Diagnosis not present

## 2016-10-28 DIAGNOSIS — S065XAA Traumatic subdural hemorrhage with loss of consciousness status unknown, initial encounter: Secondary | ICD-10-CM

## 2016-10-28 DIAGNOSIS — I110 Hypertensive heart disease with heart failure: Secondary | ICD-10-CM | POA: Diagnosis not present

## 2016-10-28 DIAGNOSIS — Z720 Tobacco use: Secondary | ICD-10-CM

## 2016-10-28 DIAGNOSIS — H02402 Unspecified ptosis of left eyelid: Secondary | ICD-10-CM | POA: Diagnosis not present

## 2016-10-28 DIAGNOSIS — Z7901 Long term (current) use of anticoagulants: Secondary | ICD-10-CM | POA: Diagnosis not present

## 2016-10-28 DIAGNOSIS — X58XXXA Exposure to other specified factors, initial encounter: Secondary | ICD-10-CM | POA: Diagnosis not present

## 2016-10-28 DIAGNOSIS — I5022 Chronic systolic (congestive) heart failure: Secondary | ICD-10-CM | POA: Diagnosis present

## 2016-10-28 DIAGNOSIS — E785 Hyperlipidemia, unspecified: Secondary | ICD-10-CM | POA: Diagnosis present

## 2016-10-28 DIAGNOSIS — I5020 Unspecified systolic (congestive) heart failure: Secondary | ICD-10-CM | POA: Diagnosis not present

## 2016-10-28 DIAGNOSIS — I4891 Unspecified atrial fibrillation: Secondary | ICD-10-CM | POA: Diagnosis not present

## 2016-10-28 DIAGNOSIS — R791 Abnormal coagulation profile: Secondary | ICD-10-CM

## 2016-10-28 LAB — CBC WITH DIFFERENTIAL/PLATELET
Basophils Absolute: 0 10*3/uL (ref 0.0–0.1)
Basophils Relative: 1 %
EOS ABS: 0.1 10*3/uL (ref 0.0–0.7)
Eosinophils Relative: 1 %
HCT: 39.1 % (ref 39.0–52.0)
HEMOGLOBIN: 12.7 g/dL — AB (ref 13.0–17.0)
LYMPHS ABS: 1.2 10*3/uL (ref 0.7–4.0)
LYMPHS PCT: 20 %
MCH: 29.6 pg (ref 26.0–34.0)
MCHC: 32.5 g/dL (ref 30.0–36.0)
MCV: 91.1 fL (ref 78.0–100.0)
MONOS PCT: 6 %
Monocytes Absolute: 0.4 10*3/uL (ref 0.1–1.0)
NEUTROS PCT: 72 %
Neutro Abs: 4.3 10*3/uL (ref 1.7–7.7)
Platelets: 209 10*3/uL (ref 150–400)
RBC: 4.29 MIL/uL (ref 4.22–5.81)
RDW: 14.7 % (ref 11.5–15.5)
WBC: 6 10*3/uL (ref 4.0–10.5)

## 2016-10-28 LAB — BASIC METABOLIC PANEL
Anion gap: 10 (ref 5–15)
BUN: 8 mg/dL (ref 6–20)
CHLORIDE: 104 mmol/L (ref 101–111)
CO2: 22 mmol/L (ref 22–32)
CREATININE: 1.19 mg/dL (ref 0.61–1.24)
Calcium: 8.6 mg/dL — ABNORMAL LOW (ref 8.9–10.3)
GFR calc Af Amer: >60 mL/min (ref >60)
GFR calc non Af Amer: 60 mL/min — ABNORMAL LOW (ref >60)
GLUCOSE: 90 mg/dL (ref 65–99)
POTASSIUM: 4.1 mmol/L (ref 3.5–5.1)
SODIUM: 136 mmol/L (ref 135–145)

## 2016-10-28 LAB — PROTIME-INR
INR: 2.46
Prothrombin Time: 27.1 seconds — ABNORMAL HIGH (ref 11.4–15.2)

## 2016-10-28 MED ORDER — PROTHROMBIN COMPLEX CONC HUMAN 500 UNITS IV KIT
1646.0000 [IU] | PACK | INTRAVENOUS | Status: AC
  Administered 2016-10-28: 1646 [IU] via INTRAVENOUS
  Filled 2016-10-28: qty 45.84

## 2016-10-28 MED ORDER — DEXAMETHASONE 4 MG PO TABS
10.0000 mg | ORAL_TABLET | Freq: Once | ORAL | Status: AC
  Administered 2016-10-28: 10 mg via ORAL
  Filled 2016-10-28: qty 3

## 2016-10-28 MED ORDER — METOCLOPRAMIDE HCL 10 MG PO TABS
10.0000 mg | ORAL_TABLET | Freq: Once | ORAL | Status: AC
  Administered 2016-10-28: 10 mg via ORAL
  Filled 2016-10-28: qty 1

## 2016-10-28 MED ORDER — VITAMIN K1 10 MG/ML IJ SOLN
10.0000 mg | INTRAVENOUS | Status: AC
  Administered 2016-10-29: 10 mg via INTRAVENOUS
  Filled 2016-10-28: qty 1

## 2016-10-28 NOTE — ED Notes (Signed)
Patient transported to CT 

## 2016-10-28 NOTE — ED Provider Notes (Signed)
[facility] DEPT Provider Note   CSN: [id number] Arrival date & time: 10/28/16  1956     History   Chief Complaint Chief Complaint  Patient presents with  . Headache    HPI Phillip Solis is a 71 y.o. male.  The history is provided by the patient.  Headache   This is a recurrent problem. Episode onset: 4 days. The problem occurs constantly. The problem has not changed since onset.The headache is associated with an unknown factor. The pain is located in the frontal region. The quality of the pain is described as throbbing. The pain is moderate. The pain does not radiate. Pertinent negatives include no fever, no malaise/fatigue, no chest pressure, no near-syncope, no nausea and no vomiting. He has tried NSAIDs for the symptoms. The treatment provided significant relief.   States that his headaches typically do not last this long. No head trauma or falls. No fevers.   Past Medical History:  Diagnosis Date  . Atrial fibrillation with rapid ventricular response ([place]) 07/14/2015  . History of atrial fibrillation    in setting of endoscopy in 2011; resolved  . History of palpitations   . Stroke [facility])     Patient Active Problem List   Diagnosis Date Noted  . Monitoring for long-term anticoagulant use 05/31/2016  . Other fatigue 11/26/2015  . Non-ischemic cardiomyopathy ([place]) 11/26/2015  . Amiodarone pulmonary toxicity   . Atrial fibrillation with rapid ventricular response ([place]) 07/14/2015  . Stroke due to embolism ([place])   . Hemiparesis, aphasia, and dysphagia as late effects of stroke ([place])   . Chronic systolic congestive heart failure ([place])   . Tachycardia   . Tachypnea   . Acute blood loss anemia   . Thrombocytopenia ([place])   . Stroke (cerebrum) ([place]) 07/12/2015  . Acute respiratory failure with hypoxia ([place])   . Acute ischemic stroke ([place]) 07/11/2015  . Cardiomyopathy, dilated, nonischemic ([place]) 07/12/2014  . PAF (paroxysmal atrial fibrillation) ([place]) 1Mar 01, 202014  . DOE  (dyspnea on exertion) 1Mar 01, 202014  . 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 Claudication ([place]) 1Mar 01, 202014    Past Surgical History:  Procedure Laterality Date  . LEFT AND RIGHT HEART CATHETERIZATION WITH CORONARY ANGIOGRAM N/A 07/23/2014   Procedure: LEFT AND RIGHT HEART CATHETERIZATION WITH CORONARY ANGIOGRAM;  Surgeon: [name], MD;  Location: [facility] CATH LAB;  Service: Cardiovascular;  Laterality: N/A;  . RADIOLOGY WITH ANESTHESIA N/A 07/11/2015   Procedure: RADIOLOGY WITH ANESTHESIA;  Surgeon: [name], MD;  Location: [facility];  Service: Radiology;  Laterality: N/A;       Home Medications    Prior to Admission medications   Medication Sig Start Date End Date Taking? Authorizing Provider  atorvastatin (LIPITOR) 20 MG tablet Take 1 tablet (20 mg total) by mouth daily at 6 PM. 08/16/16  Yes Hilty, Nadean Corwin, MD  metoprolol succinate (TOPROL-XL) 25 MG 24 hr tablet Take 1 tablet (25 mg total) by mouth daily. 05/13/16  Yes Camnitz, Will Hassell Done, MD  Multiple Vitamin (MULTIVITAMIN WITH MINERALS) TABS tablet Take 1 tablet by mouth daily.   Yes [provider]  warfarin (COUMADIN) 5 MG tablet Alternate 1/2 and 1 tablet by mouth daily as directed by coumadin clinic 08/16/16  Yes Hilty, Nadean Corwin, MD    Family History Family History  Problem Relation Age of Onset  . Heart Problems Mother   . Cancer Father   . Stroke Father     Social History Social History  Substance Use Topics  . Smoking  status: Former Smoker    Quit date: 03/19/1973  . Smokeless tobacco: Current User    Types: Chew  . Alcohol use 0.6 oz/week    1 Cans of beer per week     Comment: occasional beer     Allergies   Patient has no known allergies.   Review of Systems Review of Systems  Constitutional: Negative for fever and malaise/fatigue.  Cardiovascular: Negative for near-syncope.  Gastrointestinal: Negative for nausea and vomiting.  Neurological: Positive for headaches.  All other systems  are reviewed and are negative for acute change except as noted in the HPI    Physical Exam Updated Vital Signs BP (!) 112/93 (BP Location: Right Arm)   Pulse (!) 109   Temp 97.7 F (36.5 C) (Oral)   Resp 18   SpO2 99%   Physical Exam  Constitutional: He is oriented to person, place, and time. He appears well-developed and well-nourished. No distress.  HENT:  Head: Normocephalic and atraumatic.  Nose: Nose normal.  Eyes: Conjunctivae and EOM are normal. Pupils are equal, round, and reactive to light. Right eye exhibits no discharge. Left eye exhibits no discharge. No scleral icterus.  Neck: Normal range of motion. Neck supple.  Cardiovascular: Normal rate and regular rhythm.  Exam reveals no gallop and no friction rub.   No murmur heard. Pulmonary/Chest: Effort normal and breath sounds normal. No stridor. No respiratory distress. He has no rales.  Abdominal: Soft. He exhibits no distension. There is no tenderness.  Musculoskeletal: He exhibits no edema or tenderness.  Neurological: He is alert and oriented to person, place, and time.  Mental Status: Alert and oriented to person, place, and time. Attention and concentration normal. Speech clear.   Cranial Nerves  II Visual Fields: Intact to confrontation. Visual fields intact. III, IV, VI: Pupils equal and reactive to light and near. Full eye movement without nystagmus  V Facial Sensation: Normal. No weakness of masticatory muscles  VII: No facial weakness or asymmetry  VIII Auditory Acuity: Grossly normal  IX/X: The uvula is midline; the palate elevates symmetrically  XI: Normal sternocleidomastoid and trapezius strength  XII: The tongue is midline. No atrophy or fasciculations.   Motor System: Muscle Strength: 5/5 and symmetric in the upper and lower extremities. No pronation or drift.  Muscle Tone: Tone and muscle bulk are normal in the upper and lower extremities.   Reflexes: DTRs: 1+ and symmetrical in all four extremities.  Plantar responses are flexor bilaterally.  Coordination: Intact finger-to-nose, heel-to-shin. No tremor.  Sensation: Intact to light touch, and pinprick. Negative Romberg test.  Gait: Routine and tandem gait normal.    Skin: Skin is warm and dry. No rash noted. He is not diaphoretic. No erythema.  Psychiatric: He has a normal mood and affect.  Vitals reviewed.    ED Treatments / Results  Labs (all labs ordered are listed, but only abnormal results are displayed) Labs Reviewed  CBC WITH DIFFERENTIAL/PLATELET - Abnormal; Notable for the following:       Result Value   Hemoglobin 12.7 (*)    All other components within normal limits  BASIC METABOLIC PANEL - Abnormal; Notable for the following:    Calcium 8.6 (*)    GFR calc non Af Amer 60 (*)    All other components within normal limits  PROTIME-INR - Abnormal; Notable for the following:    Prothrombin Time 27.1 (*)    All other components within normal limits    EKG  EKG Interpretation  Date/Time:  Thursday October 28 2016 20:16:00 EDT Ventricular Rate:  101 PR Interval:  176 QRS Duration: 100 QT Interval:  324 QTC Calculation: 420 R Axis:   27 Text Interpretation:  Sinus tachycardia Possible Anterior infarct , age undetermined T wave abnormality, consider lateral ischemia Abnormal ECG No significant change since last tracing Confirmed by Addison Lank [phone]) on 10/28/2016 9:36:19 PM       Radiology Ct Head Wo Contrast  Result Date: 10/28/2016 CLINICAL DATA:  Acute onset of intermittent headaches. Initial encounter. EXAM: CT HEAD WITHOUT CONTRAST TECHNIQUE: Contiguous axial images were obtained from the base of the skull through the vertex without intravenous contrast. 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 There is approximately 4 mm of leftward midline shift, without significant subfalcine or transtentorial herniation at this time. There is also an acute focal parenchymal contusion at the high left frontal lobe, measuring 6 mm in size. A chronic infarct is noted at the left basal ganglia and medial left temporal lobe. Cerebellar atrophy is noted. The brainstem and fourth ventricle are within normal limits. The basal ganglia are unremarkable in appearance. The cerebral hemispheres otherwise demonstrate grossly normal gray-white differentiation. Vascular: No hyperdense vessel or unexpected calcification. Skull: There is no evidence of fracture; visualized osseous structures are unremarkable in appearance. Sinuses/Orbits: The orbits are within normal limits. The paranasal sinuses and mastoid air cells are well-aerated. Other: No significant soft tissue abnormalities are seen. IMPRESSION: 1. 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 Approximately 4 mm of leftward midline shift seen, without significant subfalcine or transtentorial herniation at this time. 2. Acute focal parenchymal contusion at the high left frontal lobe, measuring 6 mm. 3. Chronic infarct at the left basal ganglia and medial left temporal lobe. 4. Cerebellar atrophy noted. Critical Value/emergent results were called by telephone at the time of interpretation on 10/28/2016 at 10:32 pm to Dr. Addison Lank, who verbally acknowledged these results. Electronically Signed   By: Garald Balding M.D.   On: 10/28/2016 22:37    Procedures Procedures (including critical care time) CRITICAL CARE Performed by: Grayce Sessions Cardama Total critical care time: 40 minutes Critical care time was exclusive of separately billable procedures and treating other patients. Critical care was necessary to treat or prevent imminent or life-threatening  deterioration. Critical care was time spent personally by me on the following activities: development of treatment plan with patient and/or surrogate as well as nursing, discussions with consultants, evaluation of patient's response to treatment, examination of patient, obtaining history from patient or surrogate, ordering and performing treatments and interventions, ordering and review of laboratory studies, ordering and review of radiographic studies, pulse oximetry and re-evaluation of patient's condition.    Medications Ordered in ED Medications  prothrombin complex conc human (KCENTRA) IVPB 1,646 Units (1,646 Units Intravenous New Bag/Given 10/28/16 2308)  phytonadione (VITAMIN K) 10 mg in dextrose 5 % 50 mL IVPB (not administered)  metoCLOPramide (REGLAN) tablet 10 mg (10 mg Oral Given 10/28/16 2153)  dexamethasone (DECADRON) tablet 10 mg (10 mg Oral Given 10/28/16 2152)     Initial Impression / Assessment and Plan / ED Course  I have reviewed the triage vital signs and  the nursing notes.  Pertinent labs & imaging results that were available during my care of the patient were reviewed by me and considered in my medical decision making (see chart for details).  Clinical Course as of Oct 29 2318  Thu Oct 28, 2016  2231 CT head with acute on chronic subdurals with 4 mm shift. INR therapeutic. Will require reversal and admission for further management.  [PC]  2232 Consulting neurosurgery  [PC]  2255 Spoke with Dr. Arnoldo Morale from neurosurgery to to the look at the CT and said that there is no need for acute intervention at this time. He did agree with reversal. He requested patient be admitted to medicine service for further management. He will continue to follow the patient while admitted.  [PC]  2320 Spoke with internal medicine team who will admit the patient for further management.  [PC]    Clinical Course User Index [PC] Cardama, Grayce Sessions, MD      Final Clinical Impressions(s) /  ED Diagnoses   Final diagnoses:  Subdural hematoma ([place])  Elevated INR (international normalized ratio)      Cardama, Grayce Sessions, MD 10/28/16 2322

## 2016-10-28 NOTE — H&P (Signed)
Date: 10/29/2016               Patient Name:  Phillip Solis MRN: [id number]  DOB: Apr 18, 1946 Age / Sex: 71 y.o., male   PCP: Algis Greenhouse, MD         Medical Service: Internal Medicine Teaching Service         Attending Physician: Dr. Annia Belt, MD    First Contact: Dr. Danford Bad Pager: [phone]  Second Contact: Dr. 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 He describes the headache as a sharp pain across his forehead.  He has never had headaches in the past. The headache is fairly constant, went away once for a few hours but then returned. He took [facility] power once this morning followed by tylenol a few hours later with minimal relief. He eventually came to the ED because the headache persisted. On ROS, he denies visual changes, weakness, paresthesias, slurred speech, or confusion.   On arrival to the ED, patient was afebrile T 97.7 and hemodynamically stable with BP 112/93, HR 109, RR 18, and oxygen 99% on RA. Labs notable for a hemoglobin of 12.7 (appears baseline). CBC and BMP otherwise unremarkable, creatinine 1.19. INR was 2.46. 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 He received IV Kcentra x 1, PO decadron 10 mg x 1, and PO reglan 10 mg x 1.   Meds:  Current Meds  Medication Sig  . atorvastatin (LIPITOR) 20 MG tablet Take 1 tablet (20 mg total) by mouth daily at 6 PM.  . metoprolol succinate (TOPROL-XL) 25 MG 24 hr tablet Take 1 tablet (25 mg total) by mouth daily.   . Multiple Vitamin (MULTIVITAMIN WITH MINERALS) TABS tablet Take 1 tablet by mouth daily.  Marland Kitchen warfarin (COUMADIN) 5 MG tablet Alternate 1/2 and 1 tablet by mouth daily as directed by coumadin clinic     Allergies: Allergies as of 10/28/2016  . (No Known Allergies)   Past Medical History:  Diagnosis Date  . Atrial fibrillation with rapid ventricular response ([place]) 07/14/2015  . History of atrial fibrillation    in setting of endoscopy in 2011; resolved  . History of palpitations   . Stroke [facility])     Family History: Dad deceased from throat cancer, mom with heart problems. Three sisters, one with CVA, one with skin cancer.   Social History:  Former smoker, quit at age 66. Smoked for ~ 10 years. Drinks alcohol occasionally.  Denies illicit drug use. 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   Review of Systems: A complete ROS was negative except as per HPI.   Physical Exam: Blood pressure (!) 112/93, pulse (!) 109, temperature 97.7 F (36.5 C), temperature source Oral, resp. rate 18, SpO2 99 %. Constitutional: NAD, appears comfortable HEENT: Atraumatic, normocephalic. PERRL, anicteric sclera.  Cardiovascular: Tachycardic but regular, no murmurs, rubs, or gallops.  Pulmonary/Chest: CTAB, no wheezes, rales, or rhonchi.   Abdominal: Soft, non  tender, non distended. +BS.  Extremities: Warm and well perfused. No edema.  Neurologic exam: Mental status: A&Ox3 Cranial Nerves: II: PERRL III, IV, VI: Extra-occular motions intact bilaterally V, VII: Face symmetric, sensation intact in all 3 divisions  VIII: hearing normal to rubbing fingers bilaterally  IX, X: palate rises symmetrically XI: Head turn and shoulder shrug normal bilaterally  XII: tongue midline  Motor: Strength 5/5 on all upper and lower extremities, bulk muscle and tone are normal Deep Tendon Reflexes: 2+  symmetric  Sensory: Light touch intact and symmetric bilaterally  Coordination: There is no dysmetria on finger-to-nose. Heel to shin test normal. Psychiatric: Normal mood and affect  EKG: Personally reviewed. Sinus tach, inverted T waves in V5 & V6, unchanged from prior tracings.    CT Head Wo Contrast: IMPRESSION: 1. 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 Approximately 4 mm of leftward midline shift seen, without significant subfalcine or transtentorial herniation at this time. 2. Acute focal parenchymal contusion at the high left frontal lobe, measuring 6 mm. 3. Chronic infarct at the left basal ganglia and medial left temporal lobe. 4. Cerebellar atrophy noted.  Assessment & Plan by Problem:  Patient is a pleasant 71 yo M with a pmhx significant for HFrEF, non ischemic cardiomyopathy, PAF on warfarin, and CVA with no residual deficits who presents with headache x 1 week, found to have large bilateral subdural hematomas on admission.  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 S/p Kcentra in the ED. He denies any recent falls or head trauma. Presented with symptoms of progressive headache x 1 week that improved with decadron and reglan in the ED. No deficits on exam. Neurosurgery consulted by EDP and will see patient in the AM.  -- Q4 hour neuro checks  -- Consider repeat CT head tomorrow to monitor progression  -- Neurosurgery to see in AM  -- Hold warfarin, monitor INR -- F/u AM CBC    -- Tylenol and iburpofen prn   PAF: Currently in sinus rhythm. Will need to address long term anticoagulation in the setting of prior embolic CVA requiring mechanical thrombectomy and tPA. Hold warfarin for now.  -- tele monitoring  -- Continue metoprolol succinate 25 mg daily   HLD: -- Continue Lipitor  20 mg daily   FEN: No fluids, replete lytes prn, regular diet VTE ppx: SCDs Code Status: FULL   Dispo: Admit patient to Inpatient with expected length of stay greater than 2 midnights.  SignedVelna Ochs, MD 10/29/2016, 12:14 AM  Pager: [phone]

## 2016-10-28 NOTE — ED Triage Notes (Signed)
Pt presents to Ed for assessment of headache x 4 day, intermittent in nature, frontal in orientation.  Denies any neuro deficits, none noted by RN.  Send here by PCP for elevated HR and possibly association to headache.  Hx of afib, with a HR of 109 at triage.

## 2016-10-29 ENCOUNTER — Other Ambulatory Visit: Payer: Self-pay | Admitting: Neurosurgery

## 2016-10-29 DIAGNOSIS — I6200 Nontraumatic subdural hemorrhage, unspecified: Principal | ICD-10-CM

## 2016-10-29 DIAGNOSIS — Z7901 Long term (current) use of anticoagulants: Secondary | ICD-10-CM

## 2016-10-29 DIAGNOSIS — T45525A Adverse effect of antithrombotic drugs, initial encounter: Secondary | ICD-10-CM

## 2016-10-29 DIAGNOSIS — Z8673 Personal history of transient ischemic attack (TIA), and cerebral infarction without residual deficits: Secondary | ICD-10-CM

## 2016-10-29 LAB — PROTIME-INR
INR: 1.04
INR: 1.1
INR: 1.15
INR: 1.23
PROTHROMBIN TIME: 14.7 s (ref 11.4–15.2)
PROTHROMBIN TIME: 14.3 s (ref 11.4–15.2)
PROTHROMBIN TIME: 13.6 s (ref 11.4–15.2)
Prothrombin Time: 15.6 seconds — ABNORMAL HIGH (ref 11.4–15.2)

## 2016-10-29 LAB — CBC
HEMATOCRIT: 38.2 % — AB (ref 39.0–52.0)
HEMOGLOBIN: 12.4 g/dL — AB (ref 13.0–17.0)
MCH: 29.5 pg (ref 26.0–34.0)
MCHC: 32.5 g/dL (ref 30.0–36.0)
MCV: 91 fL (ref 78.0–100.0)
Platelets: 209 10*3/uL (ref 150–400)
RBC: 4.2 MIL/uL — ABNORMAL LOW (ref 4.22–5.81)
RDW: 14.4 % (ref 11.5–15.5)
WBC: 3.5 10*3/uL — AB (ref 4.0–10.5)

## 2016-10-29 MED ORDER — ONDANSETRON HCL 4 MG/2ML IJ SOLN: 4.0000 mg | Freq: Four times a day (QID) | INTRAMUSCULAR | Status: DC | PRN

## 2016-10-29 MED ORDER — ACETAMINOPHEN 325 MG PO TABS
650.0000 mg | ORAL_TABLET | Freq: Four times a day (QID) | ORAL | Status: DC | PRN
  Administered 2016-10-29 – 2016-11-01 (×8): 650 mg via ORAL
  Filled 2016-10-29 (×8): qty 2

## 2016-10-29 MED ORDER — IBUPROFEN 200 MG PO TABS: 800.0000 mg | ORAL_TABLET | Freq: Four times a day (QID) | ORAL | Status: DC | PRN

## 2016-10-29 MED ORDER — METOPROLOL SUCCINATE ER 25 MG PO TB24
25.0000 mg | ORAL_TABLET | Freq: Every day | ORAL | Status: DC
  Administered 2016-10-29 – 2016-11-01 (×4): 25 mg via ORAL
  Filled 2016-10-29 (×4): qty 1

## 2016-10-29 MED ORDER — ATORVASTATIN CALCIUM 10 MG PO TABS
20.0000 mg | ORAL_TABLET | Freq: Every day | ORAL | Status: DC
Start: 2016-10-29 — End: 2016-11-01
  Administered 2016-10-29 – 2016-10-31 (×3): 20 mg via ORAL
  Filled 2016-10-29 (×3): qty 2

## 2016-10-29 MED ORDER — PHYTONADIONE 5 MG PO TABS
5.0000 mg | ORAL_TABLET | Freq: Every day | ORAL | Status: DC
  Administered 2016-10-29 – 2016-10-30 (×2): 5 mg via ORAL
  Filled 2016-10-29 (×3): qty 1

## 2016-10-29 MED ORDER — ACETAMINOPHEN 650 MG RE SUPP: 650.0000 mg | Freq: Four times a day (QID) | RECTAL | Status: DC | PRN

## 2016-10-29 MED ORDER — ONDANSETRON HCL 4 MG PO TABS: 4.0000 mg | ORAL_TABLET | Freq: Four times a day (QID) | ORAL | Status: DC | PRN

## 2016-10-29 MED ORDER — SODIUM CHLORIDE 0.9% FLUSH
3.0000 mL | Freq: Two times a day (BID) | INTRAVENOUS | Status: DC
  Administered 2016-10-29 – 2016-11-01 (×8): 3 mL via INTRAVENOUS

## 2016-10-29 NOTE — Care Management Note (Signed)
Case Management Note  Patient Details  Name: Phillip Solis MRN: [id number] Date of Birth: 07-22-1945  Subjective/Objective:    Subdural hematoma                Action/Plan: Discharge Planning: NCM spoke to pt and family at bedside. Wife at home to assist with care. Pt states he was independent prior to hospital. Will continue to follow for dc needs.   PCP DOUGH, ROBERT L  MD   Expected Discharge Date:                  Expected Discharge Plan:  Home/Self Care  In-House Referral:  NA  Discharge planning Services  CM Consult  Post Acute Care Choice:  NA Choice offered to:  NA  DME Arranged:  N/A DME Agency:  NA  HH Arranged:  NA HH Agency:  NA  Status of Service:  In process, will continue to follow  If discussed at Long Length of Stay Meetings, dates discussed:    Additional Comments:  Erenest Rasher, RN 10/29/2016, 11:55 AM

## 2016-10-29 NOTE — Consult Note (Signed)
Reason for Consult: Bilateral subdural hematomas Referring Physician: Dr. Tinnie Gens is an 71 y.o. male.  HPI: The patient is a 71 year old white male who is on Coumadin for atrial fibrillation. He developed a headache about 4 days ago. The headache got progressively worse. The patient came to the ER and had a head scan which demonstrated bilateral subdural hematomas. His anticoagulation was reversed. He was admitted by the medical doctors. A neurosurgical consultation was requested.  Presently the patient is alert and pleasant. He denies any notable head trauma, nausea, vomiting, seizures, numbness, tingling, weakness, speech difficulties, etc.  Past Medical History:  Diagnosis Date  . Atrial fibrillation with rapid ventricular response ([place]) 07/14/2015  . History of atrial fibrillation    in setting of endoscopy in 2011; resolved  . History of palpitations   . Stroke [facility])     Past Surgical History:  Procedure Laterality Date  . LEFT AND RIGHT HEART CATHETERIZATION WITH CORONARY ANGIOGRAM N/A 07/23/2014   Procedure: LEFT AND RIGHT HEART CATHETERIZATION WITH CORONARY ANGIOGRAM;  Surgeon: [name], MD;  Location: [facility] CATH LAB;  Service: Cardiovascular;  Laterality: N/A;  . RADIOLOGY WITH ANESTHESIA N/A 07/11/2015   Procedure: RADIOLOGY WITH ANESTHESIA;  Surgeon: [name], MD;  Location: [place];  Service: Radiology;  Laterality: N/A;    Family History  Problem Relation Age of Onset  . Heart Problems Mother   . Cancer Father   . Stroke Father     Social History:  reports that he quit smoking about 43 years ago. His smokeless tobacco use includes Chew. He reports that he drinks about 0.6 oz of alcohol per week . He reports that he does not use drugs.  Allergies: No Known Allergies  Medications:  I have reviewed the patient's current medications. Prior to Admission:  Prescriptions Prior to Admission  Medication Sig Dispense Refill Last Dose  .  atorvastatin (LIPITOR) 20 MG tablet Take 1 tablet (20 mg total) by mouth daily at 6 PM. 90 tablet 1 10/28/2016 at Unknown time  . metoprolol succinate (TOPROL-XL) 25 MG 24 hr tablet Take 1 tablet (25 mg total) by mouth daily. 90 tablet 1 10/28/2016 at 1800  . Multiple Vitamin (MULTIVITAMIN WITH MINERALS) TABS tablet Take 1 tablet by mouth daily.   10/28/2016 at Unknown time  . warfarin (COUMADIN) 5 MG tablet Alternate 1/2 and 1 tablet by mouth daily as directed by coumadin clinic 90 tablet 0 10/28/2016 at 1800   Scheduled: . atorvastatin  20 mg Oral q1800  . metoprolol succinate  25 mg Oral Daily  . sodium chloride flush  3 mL Intravenous Q12H   Continuous:  IPJ:[id number] **OR** acetaminophen, ibuprofen, ondansetron **OR** ondansetron (ZOFRAN) IV Anti-infectives    None       Results for orders placed or performed during the hospital encounter of 10/28/16 (from the past 48 hour(s))  CBC with Differential     Status: Abnormal   Collection Time: 10/28/16  9:44 PM  Result Value Ref Range   WBC 6.0 4.0 - 10.5 K/uL   RBC 4.29 4.22 - 5.81 MIL/uL   Hemoglobin 12.7 (L) 13.0 - 17.0 g/dL   HCT 39.1 39.0 - 52.0 %   MCV 91.1 78.0 - 100.0 fL   MCH 29.6 26.0 - 34.0 pg   MCHC 32.5 30.0 - 36.0 g/dL   RDW 14.7 11.5 - 15.5 %   Platelets 209 150 - 400 K/uL   Neutrophils Relative % 72 %   Neutro Abs  4.3 1.7 - 7.7 K/uL   Lymphocytes Relative 20 %   Lymphs Abs 1.2 0.7 - 4.0 K/uL   Monocytes Relative 6 %   Monocytes Absolute 0.4 0.1 - 1.0 K/uL   Eosinophils Relative 1 %   Eosinophils Absolute 0.1 0.0 - 0.7 K/uL   Basophils Relative 1 %   Basophils Absolute 0.0 0.0 - 0.1 K/uL  Basic metabolic panel     Status: Abnormal   Collection Time: 10/28/16  9:44 PM  Result Value Ref Range   Sodium 136 135 - 145 mmol/L   Potassium 4.1 3.5 - 5.1 mmol/L   Chloride 104 101 - 111 mmol/L   CO2 22 22 - 32 mmol/L   Glucose, Bld 90 65 - 99 mg/dL   BUN 8 6 - 20 mg/dL   Creatinine, Ser 1.19 0.61 - 1.24 mg/dL    Calcium 8.6 (L) 8.9 - 10.3 mg/dL   GFR calc non Af Amer 60 (L) >60 mL/min   GFR calc Af Amer >60 >60 mL/min    Comment: (NOTE) The eGFR has been calculated using the CKD EPI equation. This calculation has not been validated in all clinical situations. eGFR's persistently <60 mL/min signify possible Chronic Kidney Disease.    Anion gap 10 5 - 15  Protime-INR     Status: Abnormal   Collection Time: 10/28/16  9:44 PM  Result Value Ref Range   Prothrombin Time 27.1 (H) 11.4 - 15.2 seconds   INR 2.46   Protime-INR     Status: Abnormal   Collection Time: 10/29/16 12:56 AM  Result Value Ref Range   Prothrombin Time 15.6 (H) 11.4 - 15.2 seconds   INR 1.23     Ct Head Wo Contrast  Result Date: 10/28/2016 CLINICAL DATA:  Acute onset of intermittent headaches. Initial encounter. EXAM: CT HEAD WITHOUT CONTRAST TECHNIQUE: Contiguous axial images were obtained from the base of the skull through the vertex without intravenous contrast. COMPARISON:  CT of the head performed 07/12/2015, and MRI/MRA of the brain performed 07/13/2015 FINDINGS: Brain: Prominent bilateral acute on chronic subdural hematomas are noted, with layering acute components, measuring up to 1.3 cm in thickness on the right and 1.1 cm in thickness on the left. There is approximately 4 mm of leftward midline shift, without significant subfalcine or transtentorial herniation at this time. There is also an acute focal parenchymal contusion at the high left frontal lobe, measuring 6 mm in size. A chronic infarct is noted at the left basal ganglia and medial left temporal lobe. Cerebellar atrophy is noted. The brainstem and fourth ventricle are within normal limits. The basal ganglia are unremarkable in appearance. The cerebral hemispheres otherwise demonstrate grossly normal gray-white differentiation. Vascular: No hyperdense vessel or unexpected calcification. Skull: There is no evidence of fracture; visualized osseous structures are  unremarkable in appearance. Sinuses/Orbits: The orbits are within normal limits. The paranasal sinuses and mastoid air cells are well-aerated. Other: No significant soft tissue abnormalities are seen. IMPRESSION: 1. 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 Approximately 4 mm of leftward midline shift seen, without significant subfalcine or transtentorial herniation at this time. 2. Acute focal parenchymal contusion at the high left frontal lobe, measuring 6 mm. 3. Chronic infarct at the left basal ganglia and medial left temporal lobe. 4. Cerebellar atrophy noted. Critical Value/emergent results were called by telephone at the time of interpretation on 10/28/2016 at 10:32 pm to Dr.  PEDRO CARDAMA, who verbally acknowledged these results. Electronically Signed   By: Garald Balding M.D.   On: 10/28/2016 22:37    ROS: As above. He denies neck pain Blood pressure 105/72, pulse 70, temperature 97.9 F (36.6 C), temperature source Oral, resp. rate 18, height '5\' 6"'  (1.676 m), weight 64 kg (141 lb), SpO2 96 %. Physical Exam  General: An alert and pleasant 71 year old white male in no apparent distress.  HEENT: Normocephalic, atraumatic, pupils equal round reactive light, extraocular muscles are intact.  Neck: Supple with an age-appropriate will range of motion.  Rx: Symmetric  Abdomen: Soft  Extremities: Unremarkable  Neurologic exam the patient is alert and oriented 3. Glasgow Coma Scale 15. Cranial nerves II through XII were examined bilaterally and grossly normal. Vision and hearing are grossly normal bilaterally. His motor strength is 5 over 5 in his bilateral biceps, triceps, hand grip, gastrocnemius, dorsiflexors. Sensory function is intact to light touch sensation in all tested dermatomes bilaterally.  Imaging studies I reviewed the patient's head CT performed yesterday at [facility]. He has spinal  subdural hematomas with mild right-to-left midline shift.  Assessment/Plan: Bilateral subdural hematomas: The patient's Coumadin anticoagulation has been reversed. The patient is doing well clinically. I have discussed the various treatment options with the patient and recommend that he remain hospitalized throughout the weekend. We will plan to repeat his CAT scan on Monday to see if his hematomas may resolve without intervention. If the hematomas have not began to resolve or have worsened plan to do bilateral bur holes on Monday. I described the surgery to him. We have discussed services surgery including risks of anesthesia, hemorrhage, infection, seizures, recurrent subdural hematomas, medical risks, etc. I have answered all his questions. He is agreeable to this plan.  Whittany Parish D 10/29/2016, 7:08 AM

## 2016-10-29 NOTE — Progress Notes (Signed)
   Subjective: Seen and evaluated today at bedside. Feels well and is without complaint. HA resolved with medication.   Objective:  Vital signs in last 24 hours: Vitals:   10/29/16 0030 10/29/16 0102 10/29/16 0543 10/29/16 1022  BP: 123/78 113/67 105/72 108/76  Pulse: 80 77 70 69  Resp: 14 16 18 18   Temp:  98.1 F (36.7 C) 97.9 F (36.6 C) 98.7 F (37.1 C)  TempSrc:  Oral Oral Oral  SpO2: 98% 97% 96% 97%  Weight:  141 lb (64 kg)    Height:  5\' 6"  (1.676 m)     General: Very friendly. In no acute distress. Resting comfortably in bed.  HENT: PERRL. EOMI. Oropharynx clear, mucous membranes moist.  Cardiovascular: Regular rate and rhythm. No murmur or rub appreciated. Pulmonary: CTA BL, no wheezing, crackles or rhonchi appreciated. Unlabored breathing.  Abdomen: Soft, non-tender and non-distended. +bowel sounds.  Skin: Warm, dry. No cyanosis.  Neuro: Strength and sensation grossly intact. Normal cerebellar testing. Alert and oriented.  Psych: Mood normal and affect was mood congruent. Responds to questions appropriately.   Assessment/Plan:  Active Problems:   Subdural hematoma (HCC)   Chronic anticoagulation   Adverse reaction to antithrombotic medication   History of cardioembolic stroke  #BL Subdural Hematomas #Atrial fibrillation, on chronic anticoagulation #Hx of CVA INR therapeutic on presentation however reversed with Kcentra and vitamin K. Notes resolution of his HA with decadron. He is still without neurologic deficits. Seen by neurosurgery who recommend observation over the weekend and repeat CT scan on Monday morning. If not improved, or progressing, will plan for BL Burr holes.  -Monitor neurologic status -Hold anticoagulation, will work on approval for one of the new target specific anticoagulants. Will not be resumed for at least 7-14 days after resolution/stability established.  -Monitor INR daily -PO Vitamin K 5mg  daily -Neurosurgery on board, appreciate  recommendations.   #HTN #HLD BP well controlled on home toprol xl 25 mg daily. -Continue Toprol XL 25mg  -Continue Atorvastatin 20mg   #HFrEF #NICM Euvolemic on exam. No chest pain -Continue to monitor  DVT ppx: SCDs  Dispo: Anticipated discharge in approximately 3 day(s).   Abem Shaddix, DO 10/29/2016, 11:28 AM Pager: [phone]

## 2016-10-30 LAB — PROTIME-INR
INR: 1.01
PROTHROMBIN TIME: 13.4 s (ref 11.4–15.2)

## 2016-10-30 NOTE — Progress Notes (Signed)
Patient c/o 7/10 frontal headache with intermittent sharp pains. Patient has increased drowsiness. Neuro assessment complete. Patient states he had a room full of family today with a lot going on. RN provided education to patient about subdural hematomas and environmental stimuli. Patient repositioned in bed for optimal HOB >30. RN will continue to monitor patient.

## 2016-10-30 NOTE — Progress Notes (Signed)
  Date: 10/30/2016  Patient name: Phillip Solis  Medical record number: [id number]  Date of birth: 11-22-1945   This patient's plan of care was discussed with the house staff. Please see their note for complete details. I concur with their findings.   Sid Falcon, MD 10/30/2016, 1:45 PM

## 2016-10-30 NOTE — Progress Notes (Signed)
Patient ID: Phillip Solis, male   DOB: Aug 27, 1945, 71 y.o.   MRN: [id number] Looks great, no headache, MAEx4, eating breakfast, awake alert. Doing well, following

## 2016-10-30 NOTE — Progress Notes (Signed)
   Subjective:  Patient feels well, headache is improved. No new weakness or visual changes. He was to take a bath.  Objective:  Vital signs in last 24 hours: Vitals:   10/29/16 2151 10/30/16 0140 10/30/16 0540 10/30/16 0909  BP: 115/72 109/62 103/70 113/72  Pulse: 63 71 (!) 57 60  Resp: 18 18 18 18   Temp: 97.7 F (36.5 C) 98.1 F (36.7 C) 97.9 F (36.6 C) 97.9 F (36.6 C)  TempSrc: Oral Oral Oral Oral  SpO2: 96% 98% 97% 98%  Weight:      Height:       General: Very friendly. In no acute distress. Resting comfortably in recliner.  HENT: PERRL. EOMI. Cardiovascular: Regular rate and rhythm. No murmur or rub appreciated. Pulmonary: CTA BL, no wheezing, crackles or rhonchi appreciated. Unlabored breathing.  Abdomen: Soft, non-tender and non-distended.  Skin: Warm, dry. No cyanosis.  Neuro: Strength and sensation grossly intact. Normal cerebellar testing. Alert and oriented.   Assessment/Plan:  Active Problems:   Subdural hematoma (HCC)   Chronic anticoagulation   Adverse reaction to antithrombotic medication   History of cardioembolic stroke  #BL Subdural Hematomas #Atrial fibrillation, on chronic anticoagulation #Hx of CVA INR therapeutic on presentation however reversed with Kcentra and vitamin K. Notes resolution of his HA with decadron. He is still without neurologic deficits. Seen by neurosurgery who recommend observation over the weekend and repeat CT scan on Monday morning. If not improved, or progressing, will plan for BL Burr holes.  -Monitor neurologic status -Hold anticoagulation, will work on approval for one of the new target specific anticoagulants. Will not be resumed for at least 7-14 days after resolution/stability established.  -Monitor INR daily - 1.01 this morning -Neurosurgery on board, appreciate recommendations.   #HTN #HLD BP well controlled on home toprol xl 25 mg daily. -Continue Toprol XL 25mg  -Continue Atorvastatin  20mg   #HFrEF #NICM Euvolemic on exam. No chest pain -Continue to monitor  DVT ppx: SCDs  Dispo: Anticipated discharge in approximately 3 day(s).   Zada Finders, MD 10/30/2016, 9:18 AM

## 2016-10-31 DIAGNOSIS — H02402 Unspecified ptosis of left eyelid: Secondary | ICD-10-CM

## 2016-10-31 DIAGNOSIS — I4891 Unspecified atrial fibrillation: Secondary | ICD-10-CM

## 2016-10-31 DIAGNOSIS — I5020 Unspecified systolic (congestive) heart failure: Secondary | ICD-10-CM

## 2016-10-31 DIAGNOSIS — S065X9A Traumatic subdural hemorrhage with loss of consciousness of unspecified duration, initial encounter: Secondary | ICD-10-CM

## 2016-10-31 DIAGNOSIS — E785 Hyperlipidemia, unspecified: Secondary | ICD-10-CM

## 2016-10-31 DIAGNOSIS — Z8673 Personal history of transient ischemic attack (TIA), and cerebral infarction without residual deficits: Secondary | ICD-10-CM

## 2016-10-31 DIAGNOSIS — I110 Hypertensive heart disease with heart failure: Secondary | ICD-10-CM

## 2016-10-31 DIAGNOSIS — Z7901 Long term (current) use of anticoagulants: Secondary | ICD-10-CM

## 2016-10-31 DIAGNOSIS — X58XXXA Exposure to other specified factors, initial encounter: Secondary | ICD-10-CM

## 2016-10-31 DIAGNOSIS — I428 Other cardiomyopathies: Secondary | ICD-10-CM

## 2016-10-31 LAB — PROTIME-INR
INR: 0.95
Prothrombin Time: 12.6 seconds (ref 11.4–15.2)

## 2016-10-31 NOTE — Progress Notes (Signed)
Patient ID: Phillip Solis, male   DOB: [birth year], 71 y.o.   MRN: [id number] Patient doing fine this morning condition headache but otherwise no nausea no vomiting  Awake alert oriented strength out of 5 with no pronator drift  Dr. Arnoldo Morale to check a repeat CT scan in the morning possible surgery versus discharge with observation.

## 2016-10-31 NOTE — Progress Notes (Signed)
  Date: 10/31/2016  Patient name: Phillip Solis  Medical record number: [id number]  Date of birth: 03/31/1946   I have seen and evaluated this patient and I have discussed the plan of care with the house staff. Please see Dr. Serita Grit note for complete details. I concur with his findings.  Ptosis reported as new.  Headache present but improved from admission.  No other signs of CN 3 issue.  Continue to monitor, CT head tomorrow.  NSG note reviewed.   Sid Falcon, MD 10/31/2016, 12:53 PM

## 2016-10-31 NOTE — Progress Notes (Signed)
   Subjective:  Patient feels well, had a return of his throbbing headache overnight without visual changes, nausea, or vomiting. He says his headache is less severe than before and comes and goes. No new weakness.  Objective:  Vital signs in last 24 hours: Vitals:   10/30/16 1427 10/30/16 1757 10/30/16 2100 10/31/16 1038  BP: 108/74 112/73 112/65 109/67  Pulse: 63 71 60   Resp: 16 18 18 17   Temp: 98.1 F (36.7 C) 98.1 F (36.7 C) 97.5 F (36.4 C) 98.7 F (37.1 C)  TempSrc: Oral Oral Oral Oral  SpO2: 98% 99% 99% 99%  Weight:      Height:       General: Very friendly. In no acute distress. Resting comfortably in recliner.  HEENT: PERRL. EOMI. ? New ptosis left eye. Cardiovascular: Regular rate and rhythm. No murmur or rub appreciated. Pulmonary: CTA BL, no wheezing, crackles or rhonchi appreciated. Unlabored breathing.  Abdomen: Soft, non-tender and non-distended.  Skin: Warm, dry. No cyanosis.  Neuro: Strength and sensation grossly intact. Normal cerebellar testing. Alert and oriented.   Assessment/Plan:  Active Problems:   Subdural hematoma (HCC)   Chronic anticoagulation   Adverse reaction to antithrombotic medication   History of cardioembolic stroke  #BL Subdural Hematomas #Atrial fibrillation, on chronic anticoagulation #Hx of CVA He has left sided ptosis which appears to be new, otherwise he is without other neurologic deficits. Extraocular movements are intact and pupils are equal round and reactive to light. Neurosurgery have recommended a repeat CT scan on Monday morning to determine a plan for BL Burr holes vs discharge with observation. -Monitor neurologic status -Hold anticoagulation, will work on approval for one of the new target specific anticoagulants. Will not be resumed for at least 7-14 days after resolution/stability established.  -Monitor INR daily - 0.95 this morning -Neurosurgery on board, appreciate recommendations.   #HTN #HLD BP well  controlled on home toprol xl 25 mg daily. -Continue Toprol XL 25mg  -Continue Atorvastatin 20mg   #HFrEF #NICM Euvolemic on exam. No chest pain -Continue to monitor  DVT ppx: SCDs  Dispo: Anticipated discharge pending CT head findings tomorrow.   Zada Finders, MD 10/31/2016, 11:54 AM

## 2016-11-01 ENCOUNTER — Encounter (HOSPITAL_COMMUNITY)
Admission: EM | Disposition: A | Discharge status: Home / Self Care | Payer: Self-pay | Source: Home / Self Care | Attending: Internal Medicine

## 2016-11-01 ENCOUNTER — Inpatient Hospital Stay (HOSPITAL_COMMUNITY): Payer: Medicare HMO

## 2016-11-01 DIAGNOSIS — T45525D Adverse effect of antithrombotic drugs, subsequent encounter: Secondary | ICD-10-CM

## 2016-11-01 LAB — PROTIME-INR
INR: 0.95
PROTHROMBIN TIME: 12.7 s (ref 11.4–15.2)

## 2016-11-01 SURGERY — CREATION, CRANIAL BURR HOLE
Anesthesia: General | Laterality: Bilateral

## 2016-11-01 MED ORDER — KETOROLAC TROMETHAMINE 15 MG/ML IJ SOLN
15.0000 mg | Freq: Once | INTRAMUSCULAR | Status: AC | PRN
  Administered 2016-11-01: 15 mg via INTRAVENOUS
  Filled 2016-11-01: qty 1

## 2016-11-01 MED ORDER — DEXAMETHASONE SODIUM PHOSPHATE 10 MG/ML IJ SOLN
10.0000 mg | Freq: Once | INTRAMUSCULAR | Status: AC | PRN
  Administered 2016-11-01: 10 mg via INTRAVENOUS
  Filled 2016-11-01: qty 1

## 2016-11-01 MED ORDER — METOCLOPRAMIDE HCL 5 MG/ML IJ SOLN
10.0000 mg | Freq: Once | INTRAMUSCULAR | Status: AC | PRN
  Administered 2016-11-01: 10 mg via INTRAVENOUS
  Filled 2016-11-01: qty 2

## 2016-11-01 NOTE — Progress Notes (Signed)
Patient c/o 9/10 headache, unrelieved by 650 mg tylenol administered at 2311. No neurological changes noted on assessment. MD paged. RN will continue to monitor patient.

## 2016-11-01 NOTE — Discharge Summary (Signed)
Medicine attending discharge note: I personally examined this patient on the day of discharge and I attest to the accuracy of the discharge evaluation and plan as recorded in the final progress note by resident physician Dr. Einar Gip.  Clinical summary: 71 year old man on chronic warfarin anticoagulation due to paroxysmal atrial fibrillation.  He suffered a prior left middle cerebral artery embolic stroke in March 0383 and anticoagulation started at that time.  He presented with a one-week history of persistent frontal headache and was found to have bilateral subdural hematomas.  He had no focal neurologic deficits on exam.  He was followed closely.  There was a question of new mild left ptosis on exam on the day prior to discharge.  He had intermittent headaches but he remained alert and oriented with no other focal deficits on exam.  Follow-up CT scan on the day of discharge showed stable hematomas with some signs of early resolution.  Neurosurgeon reviewed the studies and felt he was stable to be discharged for further close observation as an outpatient. We will continue to hold anticoagulants over the next 2 weeks.  Cardiogram this admission showed sinus rhythm.  However, in view of the increased risk for recurrent stroke, and history of paroxysmal atrial fibrillation, I believe he should be put back on anticoagulation after a suitable interval.  I would favor using 1 of the new target specific oral anticoagulants over warfarin.  We will contact his cardiology/Coumadin clinic and see if they can assist with insurance assistance for him so that he can get 1 of these.  Disposition: Condition stable at time of discharge Follow-up in 1 week with neurosurgery with repeat scan. Follow-up with cardiology There were no complications

## 2016-11-01 NOTE — Progress Notes (Signed)
Patient ID: Phillip Solis, male   DOB: 1945/09/05, 71 y.o.   MRN: [id number] Subjective:  The patient is alert and pleasant. He looks well. He denies headaches.  Objective: Vital signs in last 24 hours: Temp:  [97.7 F (36.5 C)-98.7 F (37.1 C)] 98.2 F (36.8 C) (06/25 0943) Pulse Rate:  [59-71] 62 (06/25 0943) Resp:  [16-18] 16 (06/25 0943) BP: (101-125)/(57-78) 101/65 (06/25 0943) SpO2:  [98 %-99 %] 98 % (06/25 0943)  Intake/Output from previous day: 06/24 0701 - 06/25 0700 In: 820 [P.O.:820] Out: 400 [Urine:400] Intake/Output this shift: No intake/output data recorded.  Physical exam the patient is alert and oriented 3. Glasgow Coma Scale 15. His speech, strength, and sensation is normal. He has a mild left ptosis.  I have reviewed the patient's follow-up head CT performed today at [facility]. He has bilateral subdural hematomas which are becoming chronic. His intraparenchymal hematoma is small and resolving. He has old left lacunar infarcts.  Lab Results: No results for input(s): WBC, HGB, HCT, PLT in the last 72 hours. BMET No results for input(s): NA, K, CL, CO2, GLUCOSE, BUN, CREATININE, CALCIUM in the last 72 hours.  Studies/Results: Ct Head Wo Contrast  Result Date: 11/01/2016 CLINICAL DATA:  Follow-up subdural hematoma. EXAM: CT HEAD WITHOUT CONTRAST TECHNIQUE: Contiguous axial images were obtained from the base of the skull through the vertex without intravenous contrast. COMPARISON:  CT HEAD October 28, 2016 FINDINGS: BRAIN: Stable symmetric 12 mm holo hemispheric subdural hematomas with hematocrit level. Trace LEFT falcine to bilateral cerebellar tentorial subdural hematomas. Stable subcentimeter LEFT frontal lobe hematoma. 4 mm RIGHT to LEFT midline shift, partially effaced lateral ventricles without hydrocephalus. Narrowed basal cisterns. Dense LEFT frontal lobe white matter subcentimeter hematoma without significant surrounding vasogenic edema. Minimal white  matter changes compatible with chronic small vessel ischemic disease. No acute large vascular territory infarct. Old LEFT basal ganglia lacunar infarct. Minimal LEFT parietal encephalomalacia. VASCULAR: Moderate calcific atherosclerosis of the carotid siphons. SKULL: No skull fracture. No significant scalp soft tissue swelling. SINUSES/ORBITS: The mastoid air-cells and included paranasal sinuses are well-aerated.The included ocular globes and orbital contents are non-suspicious. OTHER: None. IMPRESSION: Evolving acute to subacute 12 mm bilateral holo hemispheric subdural hematomas and trace falcotentorial subdural hematoma. 4 mm RIGHT to LEFT midline shift. Evolving acute LEFT frontal lobe hematoma. Bold LEFT MCA territory infarcts. Electronically Signed   By: Elon Alas M.D.   On: 11/01/2016 01:37    Assessment/Plan: Bilateral subdural hematomas: I have discussed the situation with the patient and with Dr. Danford Bad.  I don't know whether his slight ptosis was present chronically, but he is otherwise neurologically normal and asymptomatic from these hematomas. His CAT scan demonstrates evolution of the subdural hematomas and is perhaps slightly improved, but certainly no worse. Therefore it is difficult to make a compelling case for surgery. I discussed this with the patient. I think he can be discharged from my point of view. I've instructed him to stop taking Coumadin until further notice. Please have him follow-up with me next Tuesday in the office and we'll plan to repeat his CAT scan. I've also instructed him to come back to the hospital if he has worsening headaches, persistent nausea and vomiting, mental status changes, weakness, etc.  LOS: 4 days     Felicie Kocher D 11/01/2016, 9:56 AM

## 2016-11-01 NOTE — Care Management Note (Signed)
Case Management Note  Patient Details  Name: Phillip Solis MRN: [id number] Date of Birth: 1946/02/08  Subjective/Objective:                    Action/Plan: Pt discharging home with self care. Pt states his wife is taking some time off from work so she will be able to provide 24/7 care. He states his son can also provide assistance as needed.  Pt states his wife will be providing transportation home. Pt has PCP and insurance. No further needs per CM.  Expected Discharge Date:  11/01/16               Expected Discharge Plan:  Home/Self Care  In-House Referral:  NA  Discharge planning Services  CM Consult  Post Acute Care Choice:  NA Choice offered to:  NA  DME Arranged:  N/A DME Agency:  NA  HH Arranged:  NA HH Agency:  NA  Status of Service:  Completed, signed off  If discussed at [place] of Stay Meetings, dates discussed:    Additional Comments:  Pollie Friar, RN 11/01/2016, 12:20 PM

## 2016-11-01 NOTE — Discharge Instructions (Signed)
DO NOT take warfarin/coumadin, aspirin or ibuprofen after discharge.

## 2016-11-01 NOTE — Care Management Important Message (Signed)
Important Message  Patient Details  Name: Phillip Solis MRN: [id number] Date of Birth: 1945/08/09   Medicare Important Message Given:  Yes    Orbie Pyo 11/01/2016, 12:10 PM

## 2016-11-01 NOTE — Progress Notes (Signed)
Patient to CT.

## 2016-11-01 NOTE — Progress Notes (Signed)
Migraine cocktail administered per order. RN will continue to monitor patient.

## 2016-11-01 NOTE — Progress Notes (Signed)
   Subjective:  No complaints, Had HA overnight that resolved with migraine cocktail given by night team. No new neurologic deficits, feels ready for DC. Aware he has appointment with neurosurgery next Tuesday.   Objective:  Vital signs in last 24 hours: Vitals:   11/01/16 0123 11/01/16 0219 11/01/16 0631 11/01/16 0943  BP: 122/75 125/78 (!) 106/57 101/65  Pulse: (!) 59 68 70 62  Resp: 18 16 16 16   Temp: 97.7 F (36.5 C)  97.9 F (36.6 C) 98.2 F (36.8 C)  TempSrc: Oral  Oral Oral  SpO2: 98% 99% 98% 98%  Weight:      Height:       General: Very friendly. In no acute distress. Resting comfortably in bed.  HEENT: PERRL. EOMI.  Cardiovascular: Regular rate and rhythm. No murmur or rub appreciated. Pulmonary: CTA BL, no wheezing, crackles or rhonchi appreciated. Unlabored breathing.  Abdomen: Soft, non-tender and non-distended.  Neuro: Strength and sensation grossly intact. Normal cerebellar testing. Alert and oriented.   Assessment/Plan:  Active Problems:   Subdural hematoma (HCC)   Chronic anticoagulation   Adverse reaction to antithrombotic medication   History of cardioembolic stroke  #BL Subdural Hematomas #Atrial fibrillation, on chronic anticoagulation #Hx of CVA Question left-sided ptosis yesterday however pt notes this is from his prior stroke. Did not appreciate on examination. CT this morning with evolution of the [facility], perhaps slightly improved. D/w neurosurgery, okay for d/c with FU in clinic next Tuesday with repeat CT scan. To hold anticoagulation until that time.  -DC home with neurosurg FU. -Monitor neurologic status -Hold anticoagulation, would benefit from NOAc in future   #HTN #HLD Controlled -Continue Toprol XL 25mg  -Continue Atorvastatin 20mg   #HFrEF #NICM Euvolemic on exam. No chest pain -Continue to monitor  DVT ppx: SCDs  Dispo: Discharge home today with follow-up with neurosurgery, cardiology and his PCP. D/w patient, Agreeable.    [name], DO 11/01/2016, 11:20 AM

## 2016-11-02 ENCOUNTER — Ambulatory Visit (INDEPENDENT_AMBULATORY_CARE_PROVIDER_SITE_OTHER): Payer: Medicare HMO | Admitting: Pharmacist Clinician (PhC)/ Clinical Pharmacy Specialist

## 2016-11-02 DIAGNOSIS — Z7901 Long term (current) use of anticoagulants: Secondary | ICD-10-CM

## 2016-11-02 DIAGNOSIS — I4891 Unspecified atrial fibrillation: Secondary | ICD-10-CM

## 2016-11-02 DIAGNOSIS — Z5181 Encounter for therapeutic drug level monitoring: Secondary | ICD-10-CM

## 2016-11-03 NOTE — Discharge Summary (Signed)
Name: Phillip Solis MRN: [id number] DOB: June 06, 1945 71 y.o. PCP: Algis Greenhouse, MD  Date of Admission: 10/28/2016  8:43 PM Date of Discharge: 11/01/2016 Attending Physician: Dr. Lalla Brothers MD  Discharge Diagnosis: 1. Bilateral subdural hematomas 2. Atrial fibrillation 3. On chronic anticoagulation Active Problems:   Subdural hematoma (HCC)   Chronic anticoagulation   Adverse reaction to antithrombotic medication   History of cardioembolic stroke   Discharge Medications: Allergies as of 11/01/2016      Reactions   No Known Allergies       Medication List    STOP taking these medications   warfarin 5 MG tablet Commonly known as:  COUMADIN     TAKE these medications   atorvastatin 20 MG tablet Commonly known as:  LIPITOR Take 1 tablet (20 mg total) by mouth daily at 6 PM.   metoprolol succinate 25 MG 24 hr tablet Commonly known as:  TOPROL-XL Take 1 tablet (25 mg total) by mouth daily.   multivitamin with minerals Tabs tablet Take 1 tablet by mouth daily.      Disposition and follow-up:   Phillip Solis was discharged from [facility] in stable condition.  At the hospital follow up visit please address:  1.  BL subdural hematomas: Evaluate for headache and other neurologic deficits. AT time of discharge, he had no deficits. Please ensure he continues to hold warfarn, aspirin and other blood thinning medications. He will need close follow up with neurosurgery Atrial fibrillation: Anticoagulation was held on discharge. He will need close follow up with cardiology to discuss risks and benefits of future anticoagulation.   2.  Labs / imaging needed at time of follow-up: CT head  3.  Pending labs/ test needing follow-up: none  Follow-up Appointments: Follow-up Information    Newman Pies, MD. Schedule an appointment as soon as possible for a visit in 1 week(s).   Specialty:  Neurosurgery Contact information: [address] [place] [address] [phone]        Algis Greenhouse, MD Follow up.   Specialty:  Family Medicine Contact information: [address] [place] [address] [phone]        [name], MD. Schedule an appointment as soon as possible for a visit in 3 day(s).   Specialty:  Cardiology Contact information: [place] [address] [place] Hospital Course by problem list: Active Problems:   Subdural hematoma (HCC)   Chronic anticoagulation   Adverse reaction to antithrombotic medication   History of cardioembolic stroke   #BL subdural hematomas #Atrial fibrillation on chronic anticoagulation #History of cardioembolic stroke 71 year old male on chronic warfarin secondary to atrial fibrillation and had history of prior CVA admitted 10/28/16 with a one-week history of persistent worsening frontal headache. No neurologic deficits. Found to have large bilateral subdural hematomas with 4 mm midline shift. His INR was therapeutic but it was rapidly reversed with Kcentra and PO vitamin K. Seen by neurosurgery who felt patient recommended observing for 3 days with follow-up CT to ensure no expansion. Repeat CT scan showed some early resolution without increase in size. He was discharged home with close follow-up with neurosurgery in 1 week. All anticoagulation was held upon discharge. Further anticoagulant use will need to be discussed with neurosurgery and cardiology. He might benefit from NOAc.   Disposition: Condition stable at time of discharge Follow-up in 1 week with  neurosurgery with repeat scan. Follow-up with cardiology There were no complications  Discharge Vitals:   BP 101/65 (BP Location: Left Arm)   Pulse 62   Temp 98.2 F (36.8 C) (Oral)   Resp 16   Ht 5\' 6"  (1.676 m)   Wt 141 lb (64 kg)   SpO2 98%   BMI 22.76 kg/m   Pertinent Labs, Studies, and Procedures:  CT head w/o contrast 6/21: Acute  on chronic large BL subdural hematomas measuring up to 1.3 cm on right and 1.1 cm on left. There was approximately 43mm of leftward midline shift appreciated. Chronic infarct of left basal ganglia and medial left temporal lobe CT head w/o contrast 6/25: Evolving acute to subacute BL subdural hematomas. 29mm right to left midline shift  Discharge Instructions: Phillip Solis, you were admitted due to bleeds around your brain. These are called subdural hematomas. These were likely made worse by using warfarin. Please do not take warfarin, aspirin or other blood thinners until after your follow-up appointment with neurosurgery. You may benefit from another kind of blood thinner in the future.   SignedEinar Gip, DO 11/03/2016, 1:34 PM   Pager: [phone]

## 2016-11-05 ENCOUNTER — Other Ambulatory Visit: Payer: Self-pay | Admitting: Neurosurgery

## 2016-11-05 DIAGNOSIS — S065X9A Traumatic subdural hemorrhage with loss of consciousness of unspecified duration, initial encounter: Secondary | ICD-10-CM

## 2016-11-05 DIAGNOSIS — S065XAA Traumatic subdural hemorrhage with loss of consciousness status unknown, initial encounter: Secondary | ICD-10-CM

## 2016-11-08 ENCOUNTER — Ambulatory Visit
Admission: RE | Admit: 2016-11-08 | Discharge: 2016-11-08 | Disposition: A | Discharge status: Home / Self Care | Payer: Medicare HMO | Source: Ambulatory Visit | Attending: Neurosurgery | Admitting: Neurosurgery

## 2016-11-08 ENCOUNTER — Telehealth: Payer: Self-pay | Admitting: Internal Medicine

## 2016-11-08 DIAGNOSIS — S065X9A Traumatic subdural hemorrhage with loss of consciousness of unspecified duration, initial encounter: Secondary | ICD-10-CM

## 2016-11-08 DIAGNOSIS — S065XAA Traumatic subdural hemorrhage with loss of consciousness status unknown, initial encounter: Secondary | ICD-10-CM

## 2016-11-08 NOTE — Telephone Encounter (Signed)
Spoke with Elmo Putt - Surveyor, quantity at [employer]. Patient was at their office for CT and wanted to know if he could be seen today (instead of 7/9 appt) since his wife took off. Advised no APP appts today at [facility] and MD is not in the office.

## 2016-11-15 ENCOUNTER — Ambulatory Visit (INDEPENDENT_AMBULATORY_CARE_PROVIDER_SITE_OTHER): Payer: Medicare HMO | Admitting: Internal Medicine

## 2016-11-15 ENCOUNTER — Encounter: Payer: Self-pay | Admitting: Internal Medicine

## 2016-11-15 VITALS — BP 122/74 | HR 62 | Ht 67.0 in | Wt 140.0 lb

## 2016-11-15 DIAGNOSIS — S065X9A Traumatic subdural hemorrhage with loss of consciousness of unspecified duration, initial encounter: Secondary | ICD-10-CM

## 2016-11-15 DIAGNOSIS — I480 Paroxysmal atrial fibrillation: Secondary | ICD-10-CM | POA: Diagnosis not present

## 2016-11-15 DIAGNOSIS — I420 Dilated cardiomyopathy: Secondary | ICD-10-CM | POA: Diagnosis not present

## 2016-11-15 DIAGNOSIS — S065XAA Traumatic subdural hemorrhage with loss of consciousness status unknown, initial encounter: Secondary | ICD-10-CM

## 2016-11-15 DIAGNOSIS — I6200 Nontraumatic subdural hemorrhage, unspecified: Secondary | ICD-10-CM

## 2016-11-15 DIAGNOSIS — Z8673 Personal history of transient ischemic attack (TIA), and cerebral infarction without residual deficits: Secondary | ICD-10-CM | POA: Diagnosis not present

## 2016-11-15 MED ORDER — APIXABAN 5 MG PO TABS: 5.0000 mg | ORAL_TABLET | Freq: Two times a day (BID) | ORAL | 5 refills | Status: DC

## 2016-11-15 NOTE — Patient Instructions (Addendum)
Your physician has recommended you make the following change in your medication:  -- START eliquis 5mg  twice daily (one tablet by mouth every 12 hours) -- DO NOT TAKE aspirin   Your physician wants you to follow-up in: 6 months with Dr. Debara Pickett. You will receive a reminder letter in the mail two months in advance. If you don't receive a letter, please call our office to schedule the follow-up appointment.

## 2016-11-15 NOTE — Progress Notes (Signed)
Grossly normal   OFFICE NOTE  Chief Complaint:  Hospital follow-up SDH  Primary Care Physician: Algis Greenhouse, MD  HPI:  Phillip Solis is a 71 year old gentleman who I first saw in 2011 with new-onset atrial fibrillation in the setting of vomiting and during an endoscopy to remove food which was stuck in his throat. It resolved spontaneously and he has had no recurrence that I am aware of. He occasionally gets palpitations; however, is maintained on bisoprolol 5 mg twice a day. He also takes aspirin due to a low CHADS2 score, which is probably 0. Unfortunately he does not have a primary care provider. Today's also described increasing shortness of breath over the past several months which is worse he notes when walking long distances, especially across the parking lot at [employer]. He says that when he arrives from his car he is markedly short of breath and notes that he's having pain in his legs that usually discontinues his walk. As he stops the symptoms improved. The symptoms have been going on again for several months but increasing somewhat in frequency. Phillip Solis has a strong smoking history with one and half pack per day smoking for over 30 years but he quit a number of years ago.  He has never had an ischemic evaluation to my knowledge. His EKG today does show a interventricular conduction delay and sinus bradycardia with nonspecific T-wave changes.  I saw Phillip Solis back in the office today nearly a year and a half after his last appointment. He had a number of medical problems which I felt needed further evaluation. I had ordered a stress test, abdominal ultrasound and peripheral Dopplers, but he had none of those tests performed. Apparently he lost his insurance and stopped working. He is currently on Medicare with a [employer] supplement. He recently was in [facility] for what he thought was a sinus infection. From the best I can tell he had imaging including a chest x-ray and a head  CT. He was placed on antibiotics however he was told that he had "fluid around his heart". Laboratory work indicated an elevated BNP of 1400. There is no evidence that he was diuresed and an echocardiogram was not performed. Chest x-ray demonstrated hyperinflation with mild linear atelectasis at the lung bases. He was discharged from the emergency department.  With regard to his claudication he reports she's been walking a lot more and he reports his leg pain has improved somewhat.  Phillip Solis returns today for follow-up. He underwent screening abdominal ultrasound as well as lower extremity arterial Dopplers for leg pain. This demonstrated normal ABIs and no evidence for arterial insufficiency. His abdominal aorta was normal. He also underwent an echocardiogram because of his recent episode of heart failure and elevated BNP. This does demonstrate a cardiomyopathy with EF of 35-40%. I'm concerned about underlying coronary artery disease. His symptoms have improved.  I saw Phillip Solis back in the office today. He underwent left and right heart catheterization by myself, the results are as follows:  Hemodynamics: Central Aortic Pressure / Mean Aortic Pressure: 113/64 LV Pressure / LV End diastolic Pressure: 7  Left Ventriculography: EF: 35-40% Wall Motion: global hypokinesis  Right Heart Data:  RA - 2  RV - 22/2  PA - 20/5 (12)   PCWP - 4  TPG - 8  FCO/CI - 3.92 L/min, 2.24 L/min  TDCO/CI - 5.94 L/min, 3.39 L/min  SVR - 22 Wood units ( dynscm?5/80)  PVR - 2.04 Clydene Laming  units ( dynscm?5/80)  PA Sat% - 66  AO Sat% - 97  Coronary Angiographic Data:  Left Main: Normal  Left Anterior Descending (LAD): Angiographically normal - smaller caliber, tapers around the apex.  1st diagonal (D1): Moderate-sized vessel, no stenosis.  2nd diagonal (D2): Small to moderate-sized, no stenosis.  Circumflex (LCx): Angiographically normal,  follows the AV groove and gives off a single mid-vessel OM vessel.  1st obtuse marginal: Angiographically normal  Right Coronary Artery: Dominant vessel. Larger caliber. No stenosis.  right ventricle branch of right coronary artery: normal  posterior descending artery: No stenosis  posterior lateral branch: No stenosis  Impression: 1. Angiographically normal coronary arteries 2. Severe global hypokinesis, LVEF 35-40% 3. Compensated right and left heart filling pressures 4. Moderately decreased cardiac output  He's had no problems from his catheterization. Overall he feels well. As mentioned above his right heart catheterization shows well compensated filling pressures, but he is not on diuretic. He's currently not on an ACE or ARB, and I believe he did good candidate to start on and trust oh. This would give him some benefit from the ARB as well as natruretic.  Phillip Solis returns today for follow-up. He reports he is feeling well. At last visit I started him on Entresto. He seems to be tolerating this although blood pressure remains low at 100/60 and has not allowed up titration of the medicine. Unfortunately he is not yet established a coverage from the provider so cost of the medicine is is not affordable. We'll provide him with more samples today however he does need to contact patient assistance. He's also on bisoprolol and aspirin. He denies any shortness of breath and has not had any congestive symptoms and therefore he is not currently on any diuretic other than the sacubitril.  I saw Phillip Solis back in the office today for follow-up. He reports he is breathing somewhat better. He is compliant with his medications although it's not clear yet whether or not he is been established to receive Entresto other than with her samples. Blood pressure remained stable in the low 100s. He is currently on the 24/26 mg dose of Entresto. A repeat echo recently shows an improvement in EF up to 45%.  LV filling pressures remain elevated. He still gets some shortness of breath with moderate exertion but he reports this is improved. He says that he is urinating more frequently. As mentioned above he has a remote history of PAF and given his new congestive heart failure, his CHADSVASC score is now 1 (there is no history of hypertension and he is less than age 49). He has not had any recent recurrence of atrial fibrillation therefore were maintaining him on low-dose aspirin.  11/26/2015  Mr. Eder returns today for follow-up. Unfortunately he was recently hospitalized for an acute stroke. He had a history of remote PAF without any recent recurrence. We were treating him for his cardiomyopathy and actually improved with his ejection fraction on Entresto. This medicine, however was stopped, possibly due to hypotension during this recent admission. He was in A. fib with RVR. There was difficulty in getting rate control however he spontaneously converted. He is maintaining sinus rhythm since then. He is now on Eliquis and has followed up with Dr. Curt Bears for possible antiarrhythmic medication although since he is in sinus rhythm I do not see ongoing need for EP follow-up. With regards to his cardiomyopathy, his recent echo actually shows EF is up to 45-50%. He's currently on Toprol-XL. He  has regained a lot of his strength and feels fairly well with occasional speech problems.  11/15/2016  Mr. Bodnar was seen today follow-up. Unfortunately recently was admitted for headaches. He was found to have bilateral frontal subdural hematomas. He denies any falls or head trauma was thought that this is likely spontaneous. He was on warfarin. INR was therapeutic at 2.46 however he was given vitamin K to reverse his INR. He was also given steroids due to concern for increased intracranial pressure. He was seen by neurosurgery felt that these would resolve spontaneously. He reports recurrent headache, particularly worse when  sneezing or any Valsalva maneuvers. It was felt that because of his elevated risk of stroke and CHADSVASC score of 6, he would ultimately benefit from going on a novel oral anticoagulant. He is not on aspirin.   PMHx:  Past Medical History:  Diagnosis Date  . Atrial fibrillation with rapid ventricular response ([place]) 07/14/2015  . History of atrial fibrillation    in setting of endoscopy in 2011; resolved  . History of palpitations   . Stroke [facility])     Past Surgical History:  Procedure Laterality Date  . LEFT AND RIGHT HEART CATHETERIZATION WITH CORONARY ANGIOGRAM N/A 07/23/2014   Procedure: LEFT AND RIGHT HEART CATHETERIZATION WITH CORONARY ANGIOGRAM;  Surgeon: [name], MD;  Location: [facility] CATH LAB;  Service: Cardiovascular;  Laterality: N/A;  . RADIOLOGY WITH ANESTHESIA N/A 07/11/2015   Procedure: RADIOLOGY WITH ANESTHESIA;  Surgeon: [name], MD;  Location: [facility];  Service: Radiology;  Laterality: N/A;    FAMHx:  Family History  Problem Relation Age of Onset  . Heart Problems Mother   . Cancer Father   . Stroke Father     SOCHx:   reports that he quit smoking about 43 years ago. His smokeless tobacco use includes Chew. He reports that he drinks about 0.6 oz of alcohol per week . He reports that he does not use drugs.  ALLERGIES:  Allergies  Allergen Reactions  . No Known Allergies     ROS: Pertinent items noted in HPI and remainder of comprehensive ROS otherwise negative.  HOME MEDS: Current Outpatient Prescriptions  Medication Sig Dispense Refill  . atorvastatin (LIPITOR) 20 MG tablet Take 1 tablet (20 mg total) by mouth daily at 6 PM. 90 tablet 1  . metoprolol succinate (TOPROL-XL) 25 MG 24 hr tablet Take 1 tablet (25 mg total) by mouth daily. 90 tablet 1  . Multiple Vitamin (MULTIVITAMIN WITH MINERALS) TABS tablet Take 1 tablet by mouth daily.    Marland Kitchen apixaban (ELIQUIS) 5 MG TABS tablet Take 1 tablet (5 mg total) by mouth 2 (two) times daily. 60 tablet 5    No current facility-administered medications for this visit.     LABS/IMAGING: No results found for this or any previous visit (from the past 48 hour(s)). No results found.  VITALS: BP 122/74   Pulse 62   Ht 5\' 7"  (1.702 m)   Wt 140 lb (63.5 kg)   BMI 21.93 kg/m   EXAM: General appearance: alert and no distress Neck: no carotid bruit and no JVD Lungs: clear to auscultation bilaterally Heart: regular rate and rhythm, S1, S2 normal, no murmur, click, rub or gallop Abdomen: soft, non-tender; bowel sounds normal; no masses,  no organomegaly Extremities: extremities normal, atraumatic, no cyanosis or edema Pulses: 2+ and symmetric Skin: Skin color, texture, turgor normal. No rashes or lesions Neurologic: GroPsych: Pleasanty normal Psych: Pleasant   EKG: Deferred  ASSESSMENT: 1.  Nonischemic cardiomyopathy- (now improved to 45-50%), normal coronaries by recent catheterization 2. Leg pain - normal ABI's (no clear PAD) 3. Paroxysmal atrial fibrillation on aspirin (CHADSVASC score of 6) 4. Extensive former tobacco smoker 5. History of stroke 6. Recent SDH on warfarin  PLAN: 1.   Mr. Stemm has a history of PAF with a CHADSVASC score of 6, due to risk factors including prior stroke and now recent subdural hematoma on warfarin. He was previously on Eliquis but this was discontinued due to his concern about cost. Although his INR was therapeutic and no etiology for the subdural hematoma was noted, I agree that he would probably do better back on a novel oral anticoagulant. Since he tolerated Eliquis will restart.5 mg twice a day.  Follow-up with me in 6 months.  [name], MD, [facility] Attending Cardiologist [place] [address] [place] 11/15/2016, 9:44 AM

## 2016-11-19 ENCOUNTER — Other Ambulatory Visit: Payer: Self-pay | Admitting: Neurosurgery

## 2016-11-22 ENCOUNTER — Telehealth: Payer: Self-pay | Admitting: Internal Medicine

## 2016-11-22 ENCOUNTER — Telehealth: Payer: Self-pay | Admitting: Cardiology

## 2016-11-22 ENCOUNTER — Other Ambulatory Visit: Payer: Self-pay | Admitting: Cardiology

## 2016-11-22 MED ORDER — METOPROLOL SUCCINATE ER 25 MG PO TB24: 25.0000 mg | ORAL_TABLET | Freq: Every day | ORAL | 1 refills | Status: DC

## 2016-11-22 NOTE — Telephone Encounter (Signed)
Pt of Dr. Debara Pickett  Returned call. Caller [place] (son) not on [place], patient was with him and gave permission to speak to him regarding care.  Son reports memory loss following last OV, also leg weakness.  States patient has also had stroke and "brain issues", but the memory issue is noticeably worse since 1 week ago.   Pt was restarted on Eliquis at last OV. He also takes metoprolol and lipitor. He has not taken any meds today - son is awaiting instructions on medications before administering.  Aware I will get review of these and return call -- routed to [employer]D and pharmD.

## 2016-11-22 NOTE — Telephone Encounter (Signed)
Reviewed with Dr. Ellyn Hack (DOD) then called son [place].  Patient apparently did not take any meds yesterday and is feeling more "clear" today.  Advised son that he should continue with Eliquis and metoprolol, but stop atorvastatin for now.  If memory problems worsen he can call or take Mr. Ballin to ER for evaluation.   Son voiced understanding.

## 2016-11-22 NOTE — Telephone Encounter (Signed)
Would hold lipitor.  [place]

## 2016-11-22 NOTE — Telephone Encounter (Signed)
New Message   pt son verbalized that he is calling for rn   He is weak, skimp, forgetful, pt son had to help him up stairs

## 2016-11-22 NOTE — Telephone Encounter (Signed)
Pt called in to discuss medications. He has only been taking Eliquis twice a day. In reviewing previous notes his atorvastatin was stopped due to memory issues. He should be continued on metoprolol. His son says that he has not been taking and requests to be sent into [facility] on [address]. RX sent.   Daune Perch, AGNP-C 11/22/2016  8:49 PM

## 2016-11-22 NOTE — Progress Notes (Signed)
Pt called in with questions regarding his meds. He has only been taking Eliquis bid. Per previous note, pt can stop atorvastatin. It appears that he has not been taking metoprolol. He requests that it be sent in to his pharmacy. Rx Sent.   Daune Perch, AGNP-C 11/22/2016  6:39 PM

## 2016-11-30 ENCOUNTER — Other Ambulatory Visit: Payer: Self-pay | Admitting: Internal Medicine

## 2016-11-30 ENCOUNTER — Telehealth: Payer: Self-pay | Admitting: Internal Medicine

## 2016-11-30 ENCOUNTER — Other Ambulatory Visit: Payer: Self-pay | Admitting: Neurosurgery

## 2016-11-30 DIAGNOSIS — S065X9A Traumatic subdural hemorrhage with loss of consciousness of unspecified duration, initial encounter: Secondary | ICD-10-CM

## 2016-11-30 DIAGNOSIS — S065XAA Traumatic subdural hemorrhage with loss of consciousness status unknown, initial encounter: Secondary | ICD-10-CM

## 2016-11-30 NOTE — Telephone Encounter (Signed)
Ruby ([facility]) calling, states that patient is currently there at the office and states that he was supposed have something done on his head. Ruby states that they did not have order for procedure. Call got disconnected before I could reach out to triage.

## 2016-11-30 NOTE — Telephone Encounter (Signed)
Returned call to [facility], patient was in incorrect location and she has directed patient to correct place.   Verbalized understanding.

## 2016-12-03 ENCOUNTER — Ambulatory Visit
Admission: RE | Admit: 2016-12-03 | Discharge: 2016-12-03 | Disposition: A | Discharge status: Home / Self Care | Payer: Medicare HMO | Source: Ambulatory Visit | Attending: Neurosurgery | Admitting: Neurosurgery

## 2016-12-03 DIAGNOSIS — S065X9A Traumatic subdural hemorrhage with loss of consciousness of unspecified duration, initial encounter: Secondary | ICD-10-CM

## 2016-12-03 DIAGNOSIS — S065XAA Traumatic subdural hemorrhage with loss of consciousness status unknown, initial encounter: Secondary | ICD-10-CM

## 2016-12-14 ENCOUNTER — Other Ambulatory Visit: Payer: Self-pay | Admitting: Neurosurgery

## 2016-12-14 DIAGNOSIS — S065XAA Traumatic subdural hemorrhage with loss of consciousness status unknown, initial encounter: Secondary | ICD-10-CM

## 2016-12-14 DIAGNOSIS — S065X9A Traumatic subdural hemorrhage with loss of consciousness of unspecified duration, initial encounter: Secondary | ICD-10-CM

## 2016-12-15 ENCOUNTER — Ambulatory Visit
Admission: RE | Admit: 2016-12-15 | Discharge: 2016-12-15 | Disposition: A | Discharge status: Home / Self Care | Payer: Medicare HMO | Source: Ambulatory Visit | Attending: Neurosurgery | Admitting: Neurosurgery

## 2016-12-15 DIAGNOSIS — S065XAA Traumatic subdural hemorrhage with loss of consciousness status unknown, initial encounter: Secondary | ICD-10-CM

## 2016-12-15 DIAGNOSIS — S065X9A Traumatic subdural hemorrhage with loss of consciousness of unspecified duration, initial encounter: Secondary | ICD-10-CM

## 2016-12-16 ENCOUNTER — Encounter (HOSPITAL_COMMUNITY): Payer: Self-pay

## 2016-12-16 ENCOUNTER — Emergency Department (HOSPITAL_COMMUNITY): Payer: Medicare HMO

## 2016-12-16 ENCOUNTER — Other Ambulatory Visit: Payer: Self-pay | Admitting: Neurosurgery

## 2016-12-16 ENCOUNTER — Inpatient Hospital Stay (HOSPITAL_COMMUNITY)
Admission: EM | Admit: 2016-12-16 | Discharge: 2016-12-20 | DRG: 082 | Disposition: A | Discharge status: Home / Self Care | Payer: Medicare HMO | Attending: Internal Medicine | Admitting: Internal Medicine

## 2016-12-16 DIAGNOSIS — R55 Syncope and collapse: Secondary | ICD-10-CM | POA: Diagnosis not present

## 2016-12-16 DIAGNOSIS — I639 Cerebral infarction, unspecified: Secondary | ICD-10-CM | POA: Diagnosis present

## 2016-12-16 DIAGNOSIS — Z87891 Personal history of nicotine dependence: Secondary | ICD-10-CM

## 2016-12-16 DIAGNOSIS — I6200 Nontraumatic subdural hemorrhage, unspecified: Secondary | ICD-10-CM | POA: Diagnosis not present

## 2016-12-16 DIAGNOSIS — Y92511 Restaurant or cafe as the place of occurrence of the external cause: Secondary | ICD-10-CM

## 2016-12-16 DIAGNOSIS — I482 Chronic atrial fibrillation: Secondary | ICD-10-CM | POA: Diagnosis present

## 2016-12-16 DIAGNOSIS — I4891 Unspecified atrial fibrillation: Secondary | ICD-10-CM | POA: Diagnosis present

## 2016-12-16 DIAGNOSIS — R402142 Coma scale, eyes open, spontaneous, at arrival to emergency department: Secondary | ICD-10-CM | POA: Diagnosis present

## 2016-12-16 DIAGNOSIS — G92 Toxic encephalopathy: Secondary | ICD-10-CM | POA: Diagnosis present

## 2016-12-16 DIAGNOSIS — R791 Abnormal coagulation profile: Secondary | ICD-10-CM | POA: Diagnosis present

## 2016-12-16 DIAGNOSIS — Z8673 Personal history of transient ischemic attack (TIA), and cerebral infarction without residual deficits: Secondary | ICD-10-CM | POA: Diagnosis not present

## 2016-12-16 DIAGNOSIS — R402362 Coma scale, best motor response, obeys commands, at arrival to emergency department: Secondary | ICD-10-CM | POA: Diagnosis present

## 2016-12-16 DIAGNOSIS — R29701 NIHSS score 1: Secondary | ICD-10-CM | POA: Diagnosis present

## 2016-12-16 DIAGNOSIS — I5042 Chronic combined systolic (congestive) and diastolic (congestive) heart failure: Secondary | ICD-10-CM | POA: Diagnosis present

## 2016-12-16 DIAGNOSIS — E785 Hyperlipidemia, unspecified: Secondary | ICD-10-CM | POA: Diagnosis not present

## 2016-12-16 DIAGNOSIS — S065XAA Traumatic subdural hemorrhage with loss of consciousness status unknown, initial encounter: Secondary | ICD-10-CM

## 2016-12-16 DIAGNOSIS — N179 Acute kidney failure, unspecified: Secondary | ICD-10-CM | POA: Diagnosis present

## 2016-12-16 DIAGNOSIS — R7989 Other specified abnormal findings of blood chemistry: Secondary | ICD-10-CM

## 2016-12-16 DIAGNOSIS — G9341 Metabolic encephalopathy: Secondary | ICD-10-CM | POA: Diagnosis not present

## 2016-12-16 DIAGNOSIS — G9340 Encephalopathy, unspecified: Secondary | ICD-10-CM | POA: Diagnosis not present

## 2016-12-16 DIAGNOSIS — S065X9A Traumatic subdural hemorrhage with loss of consciousness of unspecified duration, initial encounter: Principal | ICD-10-CM | POA: Diagnosis present

## 2016-12-16 DIAGNOSIS — W1830XA Fall on same level, unspecified, initial encounter: Secondary | ICD-10-CM | POA: Diagnosis present

## 2016-12-16 DIAGNOSIS — Z823 Family history of stroke: Secondary | ICD-10-CM | POA: Diagnosis not present

## 2016-12-16 DIAGNOSIS — R0789 Other chest pain: Secondary | ICD-10-CM | POA: Insufficient documentation

## 2016-12-16 DIAGNOSIS — I6340 Cerebral infarction due to embolism of unspecified cerebral artery: Secondary | ICD-10-CM | POA: Diagnosis not present

## 2016-12-16 DIAGNOSIS — R402252 Coma scale, best verbal response, oriented, at arrival to emergency department: Secondary | ICD-10-CM | POA: Diagnosis present

## 2016-12-16 LAB — RAPID URINE DRUG SCREEN, HOSP PERFORMED
Amphetamines: NOT DETECTED
BENZODIAZEPINES: NOT DETECTED
Barbiturates: NOT DETECTED
COCAINE: NOT DETECTED
OPIATES: NOT DETECTED
Tetrahydrocannabinol: NOT DETECTED

## 2016-12-16 LAB — URINALYSIS, ROUTINE W REFLEX MICROSCOPIC
BILIRUBIN URINE: NEGATIVE
Glucose, UA: NEGATIVE mg/dL
HGB URINE DIPSTICK: NEGATIVE
KETONES UR: NEGATIVE mg/dL
LEUKOCYTES UA: NEGATIVE
Nitrite: NEGATIVE
Protein, ur: 30 mg/dL — AB
SQUAMOUS EPITHELIAL / LPF: NONE SEEN
Specific Gravity, Urine: 1.021 (ref 1.005–1.030)
pH: 5 (ref 5.0–8.0)

## 2016-12-16 LAB — I-STAT CHEM 8, ED
BUN: 15 mg/dL (ref 6–20)
Calcium, Ion: 1.1 mmol/L — ABNORMAL LOW (ref 1.15–1.40)
Chloride: 106 mmol/L (ref 101–111)
Creatinine, Ser: 1.1 mg/dL (ref 0.61–1.24)
GLUCOSE: 152 mg/dL — AB (ref 65–99)
HEMATOCRIT: 39 % (ref 39.0–52.0)
HEMOGLOBIN: 13.3 g/dL (ref 13.0–17.0)
POTASSIUM: 4.5 mmol/L (ref 3.5–5.1)
Sodium: 138 mmol/L (ref 135–145)
TCO2: 14 mmol/L (ref 0–100)

## 2016-12-16 LAB — DIFFERENTIAL
BASOS ABS: 0 10*3/uL (ref 0.0–0.1)
BASOS PCT: 1 %
EOS ABS: 0.2 10*3/uL (ref 0.0–0.7)
EOS PCT: 3 %
Lymphocytes Relative: 41 %
Lymphs Abs: 3.1 10*3/uL (ref 0.7–4.0)
MONOS PCT: 5 %
Monocytes Absolute: 0.4 10*3/uL (ref 0.1–1.0)
Neutro Abs: 3.9 10*3/uL (ref 1.7–7.7)
Neutrophils Relative %: 50 %

## 2016-12-16 LAB — D-DIMER, QUANTITATIVE (NOT AT ARMC): D DIMER QUANT: 9.71 ug{FEU}/mL — AB (ref 0.00–0.50)

## 2016-12-16 LAB — COMPREHENSIVE METABOLIC PANEL
ALT: <5 U/L — ABNORMAL LOW (ref 17–63)
ANION GAP: 19 — AB (ref 5–15)
AST: 35 U/L (ref 15–41)
Albumin: 3.5 g/dL (ref 3.5–5.0)
Alkaline Phosphatase: 67 U/L (ref 38–126)
BUN: 15 mg/dL (ref 6–20)
CO2: 13 mmol/L — ABNORMAL LOW (ref 22–32)
Calcium: 8.9 mg/dL (ref 8.9–10.3)
Chloride: 106 mmol/L (ref 101–111)
Creatinine, Ser: 1.46 mg/dL — ABNORMAL HIGH (ref 0.61–1.24)
GFR, EST AFRICAN AMERICAN: 54 mL/min — AB (ref >60)
GFR, EST NON AFRICAN AMERICAN: 47 mL/min — AB (ref >60)
Glucose, Bld: 160 mg/dL — ABNORMAL HIGH (ref 65–99)
POTASSIUM: 4.5 mmol/L (ref 3.5–5.1)
Sodium: 138 mmol/L (ref 135–145)
TOTAL PROTEIN: 6.4 g/dL — AB (ref 6.5–8.1)
Total Bilirubin: 0.8 mg/dL (ref 0.3–1.2)

## 2016-12-16 LAB — CBC
HEMATOCRIT: 39.4 % (ref 39.0–52.0)
Hemoglobin: 12.8 g/dL — ABNORMAL LOW (ref 13.0–17.0)
MCH: 29.6 pg (ref 26.0–34.0)
MCHC: 32.5 g/dL (ref 30.0–36.0)
MCV: 91.2 fL (ref 78.0–100.0)
Platelets: 216 10*3/uL (ref 150–400)
RBC: 4.32 MIL/uL (ref 4.22–5.81)
RDW: 13.7 % (ref 11.5–15.5)
WBC: 7.7 10*3/uL (ref 4.0–10.5)

## 2016-12-16 LAB — APTT: APTT: 31 s (ref 24–36)

## 2016-12-16 LAB — TROPONIN I: Troponin I: <0.03 ng/mL (ref <0.03)

## 2016-12-16 LAB — I-STAT TROPONIN, ED: TROPONIN I, POC: 0.01 ng/mL (ref 0.00–0.08)

## 2016-12-16 LAB — PROTIME-INR
INR: 1.08
Prothrombin Time: 14 seconds (ref 11.4–15.2)

## 2016-12-16 LAB — ETHANOL

## 2016-12-16 LAB — BRAIN NATRIURETIC PEPTIDE: B NATRIURETIC PEPTIDE 5: 118.9 pg/mL — AB (ref 0.0–100.0)

## 2016-12-16 LAB — CBG MONITORING, ED: GLUCOSE-CAPILLARY: 128 mg/dL — AB (ref 65–99)

## 2016-12-16 MED ORDER — METOPROLOL SUCCINATE ER 25 MG PO TB24
25.0000 mg | ORAL_TABLET | Freq: Every day | ORAL | Status: DC
  Administered 2016-12-17 (×2): 25 mg via ORAL
  Filled 2016-12-16 (×3): qty 1

## 2016-12-16 MED ORDER — ACETAMINOPHEN 650 MG RE SUPP: 650.0000 mg | Freq: Four times a day (QID) | RECTAL | Status: DC | PRN

## 2016-12-16 MED ORDER — OXYCODONE-ACETAMINOPHEN 5-325 MG PO TABS
1.0000 | ORAL_TABLET | ORAL | Status: DC | PRN
  Administered 2016-12-17 – 2016-12-20 (×10): 1 via ORAL
  Filled 2016-12-16 (×11): qty 1

## 2016-12-16 MED ORDER — ONDANSETRON HCL 4 MG PO TABS: 4.0000 mg | ORAL_TABLET | Freq: Four times a day (QID) | ORAL | Status: DC | PRN

## 2016-12-16 MED ORDER — ACETAMINOPHEN 325 MG PO TABS
650.0000 mg | ORAL_TABLET | Freq: Four times a day (QID) | ORAL | Status: DC | PRN
  Administered 2016-12-18: 650 mg via ORAL
  Filled 2016-12-16: qty 2

## 2016-12-16 MED ORDER — LEVETIRACETAM 500 MG/5ML IV SOLN
1000.0000 mg | Freq: Once | INTRAVENOUS | Status: AC
  Administered 2016-12-16: 1000 mg via INTRAVENOUS
  Filled 2016-12-16: qty 10

## 2016-12-16 MED ORDER — ADULT MULTIVITAMIN W/MINERALS CH
1.0000 | ORAL_TABLET | Freq: Every day | ORAL | Status: DC
  Administered 2016-12-17 – 2016-12-20 (×4): 1 via ORAL
  Filled 2016-12-16 (×4): qty 1

## 2016-12-16 MED ORDER — FENTANYL CITRATE (PF) 100 MCG/2ML IJ SOLN
25.0000 ug | Freq: Once | INTRAMUSCULAR | Status: AC
  Administered 2016-12-16: 25 ug via INTRAVENOUS
  Filled 2016-12-16: qty 2

## 2016-12-16 MED ORDER — ATORVASTATIN CALCIUM 10 MG PO TABS
20.0000 mg | ORAL_TABLET | Freq: Every day | ORAL | Status: DC
  Administered 2016-12-19: 20 mg via ORAL
  Filled 2016-12-16: qty 1

## 2016-12-16 MED ORDER — ONDANSETRON HCL 4 MG/2ML IJ SOLN: 4.0000 mg | Freq: Four times a day (QID) | INTRAMUSCULAR | Status: DC | PRN

## 2016-12-16 MED ORDER — ZOLPIDEM TARTRATE 5 MG PO TABS
5.0000 mg | ORAL_TABLET | Freq: Every evening | ORAL | Status: DC | PRN
  Administered 2016-12-19: 5 mg via ORAL
  Filled 2016-12-16: qty 1

## 2016-12-16 NOTE — Consult Note (Addendum)
Neurology Consultation  Reason for Consult: Acute code stroke Referring Physician: Dr. Darl Householder  CC: Left facial droop, unresponsiveness, aphasia.  History is obtained from: EMS  HPI: Phillip Solis is a 71 y.o. male  with a past medical history of atrial fibrillation, old strokes with possible residual left-sided deficits according to chart notes, recent bilateral subdurals who was taken off his anticoagulation because of subdurals, presented to the emergency room via EMS. Patient was eating dinner with his wife at a local [employer] when he became unresponsive. Upon arrival, EMS found him to be not responsive to voice and noted a left facial droop, left arm weakness. He came about and was slurring his words. A code stroke was activated and he was brought to the emergency room. Off note, he has had a diagnosis of bilateral subdural hematomas with a planned bur hole evacuation later this month by neurosurgery. No family member was available at bedside to provide the history.  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   Past Medical History:  Diagnosis Date  . Atrial fibrillation with rapid ventricular response ([place]) 07/14/2015  . History of atrial fibrillation    in setting of endoscopy in 2011; resolved  . History of palpitations   . Stroke [facility])     Family History  Problem Relation Age of Onset  . Heart Problems Mother   . Cancer Father   . Stroke Father     Social History:   reports that he quit smoking about 43 years ago. His smokeless tobacco use includes Chew. He reports that he drinks about 0.6 oz of alcohol per week . He reports that he does not use drugs. Medications No current facility-administered medications for this encounter.   Current  Outpatient Prescriptions:  .  apixaban (ELIQUIS) 5 MG TABS tablet, Take 1 tablet (5 mg total) by mouth 2 (two) times daily., Disp: 60 tablet, Rfl: 5 .  atorvastatin (LIPITOR) 20 MG tablet, Take 1 tablet (20 mg total) by mouth daily at 6 PM., Disp: 90 tablet, Rfl: 1 .  ELIQUIS 5 MG TABS tablet, TAKE ONE TABLET BY MOUTH TWICE DAILY, Disp: 60 tablet, Rfl: 3 .  metoprolol succinate (TOPROL-XL) 25 MG 24 hr tablet, Take 1 tablet (25 mg total) by mouth daily., Disp: 90 tablet, Rfl: 1 .  Multiple Vitamin (MULTIVITAMIN WITH MINERALS) TABS tablet, Take 1 tablet by mouth daily., Disp: , Rfl:    Exam: Current vital signs: There were no vitals taken for this visit. Vital signs in last 24 hours:   General: Awake, alert, complains of chest discomfort and pain and left arm pain. HEENT: Normocephalic atraumatic Lungs: Clear to auscultation with no wheezes Cardiovascular: S1 and S2 heart regular rate rhythm no murmur or gallop Abdomen: Nondistended nontender Extremity: Warm well-perfused Neurological exam Mental status: Awake alert oriented 3. Language: Speech is dysarthric, naming, fluency and repetition intact. Cranial nerve exam: Pupils equal round reactive to light 3 mm and brisk, extraocular movements intact visual fields full no facial asymmetry, facial strength intact, hearing intact, tongue strength normal. Motor exam: Symmetric 4+/5 strength in all 4 extremities. Consistent complaint of pain in left arm on active or passive movement of the left arm. Sensory exam: Intact to light touch all over [place]: Intact finger-nose-finger Gait was  not tested for patient comfort   NIHSS - 1 for dysarthria   Labs I have reviewed labs in epic and the results pertinent to this consultation are:  CBC    Component Value Date/Time   WBC 7.7 12/16/2016 1903   RBC 4.32 12/16/2016 1903   HGB 13.3 12/16/2016 1907   HCT 39.0 12/16/2016 1907   PLT 216 12/16/2016 1903   MCV 91.2 12/16/2016 1903   MCH  29.6 12/16/2016 1903   MCHC 32.5 12/16/2016 1903   RDW 13.7 12/16/2016 1903   LYMPHSABS 3.1 12/16/2016 1903   MONOABS 0.4 12/16/2016 1903   EOSABS 0.2 12/16/2016 1903   BASOSABS 0.0 12/16/2016 1903    CMP     Component Value Date/Time   NA 138 12/16/2016 1907   K 4.5 12/16/2016 1907   CL 106 12/16/2016 1907   CO2 22 10/28/2016 2144   GLUCOSE 152 (H) 12/16/2016 1907   BUN 15 12/16/2016 1907   CREATININE 1.10 12/16/2016 1907   CREATININE 0.87 07/15/2014 1041   CALCIUM 8.6 (L) 10/28/2016 2144   PROT 5.1 (L) 07/13/2015 0826   ALBUMIN 2.7 (L) 07/13/2015 0826   AST 39 07/13/2015 0826   ALT 19 07/13/2015 0826   ALKPHOS 57 07/13/2015 0826   BILITOT 1.0 07/13/2015 0826   GFRNONAA 60 (L) 10/28/2016 2144   GFRAA >60 10/28/2016 2144    Lipid Panel     Component Value Date/Time   CHOL 182 07/12/2015 0558   TRIG 209 (H) 07/12/2015 0558   TRIG 207 (H) 07/12/2015 0558   HDL 21 (L) 07/12/2015 0558   CHOLHDL 8.7 07/12/2015 0558   VLDL 41 (H) 07/12/2015 0558   LDLCALC 120 (H) 07/12/2015 0558   Imaging I have reviewed the images obtained:  CT-scan of the brain Shows no hyperdense vessels. It shows bilateral subdurals, right bigger than left with an acute and chronic blood component. 5 mm right to left midline shift. Scattered hemorrhagic foci along the corpus callosum and ventricular white matter, unchanged from prior scans. Remote left temporal lobe infarct.   Assessment:  71 year old man with a past medical history of atrial fibrillation, old stroke with possible residual left-sided deficits, recent bilateral subdurals for which she was taken off of his anticoagulation presents to the ER via EMS when he became unresponsive and then was noted to have left facial droop left arm weakness and slurred speech. On exam in the emergency room, his neurological exam was nonfocal with the exception of dysarthria. He continued to complain of left arm and chest pain. He was taken in for a stat  noncontrast CT of the head which showed bilateral subdurals with acute on chronic component and stable midline shift along with scattered foci of hemorrhage in the corpus callosum and ventricular white matter which is unchanged from prior scans and chronic white matter disease.  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 -EEG at some point (can also be done as outpatient) Further management per neurosurgery and primary team. Please call us with questions as needed.  Amie Portland, MD [facility] [phone]  If 7pm to 7am, please call on call as listed on AMION.

## 2016-12-16 NOTE — H&P (Signed)
History and Physical    Phillip Solis EXH:[id number] DOB: 1946-01-04 DOA: 12/16/2016  Referring MD/NP/PA:   PCP: Algis Greenhouse, MD   Patient coming from:  The patient is coming from home.  At baseline, pt is independent for most of ADL.  Chief Complaint: unresponsiveness and chest pain  HPI: Phillip Solis is a 71 y.o. 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 PT was at [employer] with wife when he became unresponsive. Pt also had left side facial droop, left arm weakness, and slurred speech. In route to hospital pt became more responsive, but remained confused. When I saw pt in ED, he is oriented to place and person, but not to time. He answered some questions. Patient has chest pain, which is located in the left lower chest, constant, sharp, pleuritic, aggravated by deep breath. No calf tenderness. Patient does not have SOB, cough, fever or chills. He has tenderness in the left lower chest. Denies any injury to the chest. Patient does not have nausea, vomiting, diarrhea, abdominal pain, symptoms of UTI.  ED Course: pt was found to have positive d-dimer 9.71, WBC 7.7, INR 1.08, acute renal injury with creatinine 1.46, oxygen sat 97% on room air, tachycardia, chest x-ray showed chronic interstitial coarsening without infiltration. CT head showed bilateral subdural hematoma. Patient is admitted to stepdown as inpatient. Neurology and neurosurgery were consulted by EDP.  Review of Systems: Could not be reviewed accurately due to altered mental status.  Allergy:  Allergies  Allergen Reactions  . No Known Allergies     Past Medical History:  Diagnosis Date  . Atrial fibrillation with rapid ventricular response ([place]) 07/14/2015  . History of atrial fibrillation    in setting  of endoscopy in 2011; resolved  . History of palpitations   . Stroke [facility])     Past Surgical History:  Procedure Laterality Date  . LEFT AND RIGHT HEART CATHETERIZATION WITH CORONARY ANGIOGRAM N/A 07/23/2014   Procedure: LEFT AND RIGHT HEART CATHETERIZATION WITH CORONARY ANGIOGRAM;  Surgeon: [name], MD;  [facility];  Service: Cardiovascular;  Laterality: N/A;  . RADIOLOGY WITH ANESTHESIA N/A 07/11/2015   Procedure: RADIOLOGY WITH ANESTHESIA;  Surgeon: [name], MD;  Location: [place];  Service: Radiology;  Laterality: N/A;    Social History:  reports that he quit smoking about 43 years ago. His smokeless tobacco use includes Chew. He reports that he drinks about 0.6 oz of alcohol per week . He reports that he does not use drugs.  Family History:  Family History  Problem Relation Age of Onset  . Heart Problems Mother   . Cancer Father   . Stroke Father      Prior to Admission medications   Medication Sig Start Date End Date Taking? Authorizing Provider  diphenhydrAMINE (BENADRYL) 25 MG tablet Take 25 mg by mouth every 6 (six) hours as needed for allergies.   Yes [provider]  metoprolol succinate (TOPROL-XL) 25 MG 24 hr tablet Take 1 tablet (25 mg total) by mouth daily. 11/22/16  Yes Daune Perch, NP  Multiple Vitamin (MULTIVITAMIN WITH MINERALS) TABS tablet Take 1 tablet by mouth daily.   Yes [provider]  apixaban (ELIQUIS) 5 MG TABS tablet Take 1 tablet (5 mg total)  by mouth 2 (two) times daily. Patient not taking: Reported on 12/16/2016 11/15/16   [name], MD  atorvastatin (LIPITOR) 20 MG tablet Take 1 tablet (20 mg total) by mouth daily at 6 PM. Patient not taking: Reported on 12/16/2016 08/16/16   Hilty, Nadean Corwin, MD  ELIQUIS 5 MG TABS tablet TAKE ONE TABLET BY MOUTH TWICE DAILY Patient not taking: Reported on 12/16/2016 12/01/16   [name], MD    Physical Exam: Vitals:   12/17/16 0330 12/17/16 0430 12/17/16 0500  12/17/16 0530  BP: 115/80 104/68 109/68 115/64  Pulse:  64 (!) 58 63  Resp: (!) 22 16 12 15   Temp:      SpO2:  96% 98% 98%  Weight:       General: Not in acute distress HEENT:       Eyes: PERRL, EOMI, no scleral icterus.       ENT: No discharge from the ears and nose, no pharynx injection, no tonsillar enlargement.        Neck: No JVD, no bruit, no mass felt. Heme: No neck lymph node enlargement. Cardiac: S1/S2, RRR, No murmurs, No gallops or rubs. Respiratory:  No rales, wheezing, rhonchi or rubs. Chest wall: has tenderness in left lower chest wall. GI: Soft, nondistended, nontender, no rebound pain, no organomegaly, BS present. GU: No hematuria Ext: No pitting leg edema bilaterally. 2+DP/PT pulse bilaterally. Musculoskeletal: No joint deformities, No joint redness or warmth, no limitation of ROM in spin. Skin: No rashes.  Neuro: confused, oriented to place and person, but not to time. Cranial nerves II-XII grossly intact, moves all extremities. Brachial reflex 2+ bilaterally.Negative Babinski's sign.  Psych: Patient is not psychotic, no suicidal or hemocidal ideation.  Labs on Admission: I have personally reviewed following labs and imaging studies  CBC:  Recent Labs Lab 12/16/16 1903 12/16/16 1907 12/17/16 0327  WBC 7.7  --  6.6  NEUTROABS 3.9  --   --   HGB 12.8* 13.3 12.0*  HCT 39.4 39.0 35.6*  MCV 91.2  --  89.7  PLT 216  --  829   Basic Metabolic Panel:  Recent Labs Lab 12/16/16 1903 12/16/16 1907 12/17/16 0327  NA 138 138 137  K 4.5 4.5 4.6  CL 106 106 105  CO2 13*  --  26  GLUCOSE 160* 152* 99  BUN 15 15 11   CREATININE 1.46* 1.10 0.92  CALCIUM 8.9  --  8.4*   GFR: Estimated Creatinine Clearance: 67.7 mL/min (by C-G formula based on SCr of 0.92 mg/dL). Liver Function Tests:  Recent Labs Lab 12/16/16 1903  AST 35  ALT <5*  ALKPHOS 67  BILITOT 0.8  PROT 6.4*  ALBUMIN 3.5   No results for input(s): LIPASE, AMYLASE in the last 168 hours. No  results for input(s): AMMONIA in the last 168 hours. Coagulation Profile:  Recent Labs Lab 12/16/16 1903  INR 1.08   Cardiac Enzymes:  Recent Labs Lab 12/16/16 2217 12/17/16 0327  TROPONINI <0.03 <0.03   BNP (last 3 results) No results for input(s): PROBNP in the last 8760 hours. HbA1C:  Recent Labs  12/17/16 0327  HGBA1C 5.8*   CBG:  Recent Labs Lab 12/16/16 1920  GLUCAP 128*   Lipid Profile:  Recent Labs  12/17/16 0327  CHOL 199  HDL 30*  LDLCALC 153*  TRIG 81  CHOLHDL 6.6   Thyroid Function Tests: No results for input(s): TSH, T4TOTAL, FREET4, T3FREE, THYROIDAB in the last 72 hours. Anemia Panel: No  results for input(s): VITAMINB12, FOLATE, FERRITIN, TIBC, IRON, RETICCTPCT in the last 72 hours. Urine analysis:    Component Value Date/Time   COLORURINE YELLOW 12/16/2016 2048   APPEARANCEUR HAZY (A) 12/16/2016 2048   LABSPEC 1.021 12/16/2016 2048   PHURINE 5.0 12/16/2016 2048   GLUCOSEU NEGATIVE 12/16/2016 2048   HGBUR NEGATIVE 12/16/2016 2048   BILIRUBINUR NEGATIVE 12/16/2016 2048   KETONESUR NEGATIVE 12/16/2016 2048   PROTEINUR 30 (A) 12/16/2016 2048   NITRITE NEGATIVE 12/16/2016 2048   LEUKOCYTESUR NEGATIVE 12/16/2016 2048   Sepsis Labs: @LABRCNTIP (procalcitonin:4,lacticidven:4) )No results found for this or any previous visit (from the past 240 hour(s)).   Radiological Exams on Admission: Ct Head Wo Contrast  Result Date: 12/15/2016 CLINICAL DATA:  Bilateral subdural hematoma EXAM: CT HEAD WITHOUT CONTRAST TECHNIQUE: Contiguous axial images were obtained from the base of the skull through the vertex without intravenous contrast. COMPARISON:  CT head 12/03/2016 FINDINGS: Brain: Right-sided subdural fluid collection slightly larger, now measuring 12 mm in thickness, compared with 11 mm previously. No new hemorrhage. Mixed density subdural fluid on the right predominately low-density. Decreasing high-density component compared with the prior  study. Left-sided subdural mixed density subdural hematoma 8 mm in thickness, unchanged. No new bleeding on the left. 6 mm midline shift to the left slightly increased from the prior study when it measured 4.4 mm. Negative for hydrocephalus. No acute infarct. Chronic left MCA infarct. Vascular: Negative for hyperdense vessel Skull: Well-defined lucencies in the skull apex bilaterally appear benign and are chronic. Sinuses/Orbits: Negative Other: None IMPRESSION: Chronic right-sided subdural hematoma slightly larger. No new bleeding. Midline shift slightly increased, now 6 mm shift to the left Left-sided mixed density subdural hematoma unchanged without evidence of new bleeding. Electronically Signed   By: Franchot Gallo M.D.   On: 12/15/2016 11:20   Ct Angio Chest Pe W Or Wo Contrast  Result Date: 12/17/2016 CLINICAL DATA:  LEFT chest pain beginning at 5 p.m. EXAM: CT ANGIOGRAPHY CHEST WITH CONTRAST TECHNIQUE: Multidetector CT imaging of the chest was performed using the standard protocol during bolus administration of intravenous contrast. Multiplanar CT image reconstructions and MIPs were obtained to evaluate the vascular anatomy. CONTRAST:  75 cc Isovue 370 COMPARISON:  Chest radiograph December 16, 2016 and chest radiograph July 14, 2015 FINDINGS: CARDIOVASCULAR: Adequate contrast opacification of the pulmonary artery's. Main pulmonary artery is not enlarged. Nonocclusive pulmonary embolus LEFT upper lobe segmental the subsegmental branches seen on axial 123/284 though, not apparent on reformations, possible artifact. Heart size is mildly enlarged. No RIGHT heart strain (RV/LV .7). No pericardial effusions. Thoracic aorta is normal in course and caliber with mild calcific atherosclerosis MEDIASTINUM/NODES: No lymphadenopathy by CT size criteria. LUNGS/PLEURA: Tracheobronchial tree is patent, no pneumothorax. Mild bronchial wall thickening. Bilateral lower lobe atelectasis without focal consolidation. No pleural  effusion. UPPER ABDOMEN: Included view of the abdomen is unremarkable. MUSCULOSKELETAL: Moderate to severe T6 compression fracture with 50-75% height loss, new from 2017 and, possibly acute. Review of the MIP images confirms the above findings. IMPRESSION: 1. Tiny nonocclusive LEFT upper lobe segmental to subsegmental pulmonary embolus versus artifact. No RIGHT heart strain. 2. Moderate to severe T6 compression fracture, this may be acute. Recommend correlation with point tenderness. 3. Bronchial wall thickening seen with bronchitis or reactive airway disease. No pneumonia. Bilateral lower lobe atelectasis. Aortic Atherosclerosis (ICD10-I70.0). Electronically Signed   By: Elon Alas M.D.   On: 12/17/2016 02:01   Dg Chest Port 1 View  Result Date: 12/16/2016 CLINICAL DATA:  Confusion.  Altered mental status. EXAM: PORTABLE CHEST 1 VIEW COMPARISON:  07/14/2015 FINDINGS: 2004 hours. The cardio pericardial silhouette is enlarged. There is pulmonary vascular congestion without overt pulmonary edema. Interstitial markings are diffusely coarsened with chronic features. The visualized bony structures of the thorax are intact. Chronic atelectasis or scarring at the left base. IMPRESSION: Borderline cardiomegaly with chronic interstitial coarsening and probable atelectasis or scarring at the left base. Electronically Signed   By: Misty Stanley M.D.   On: 12/16/2016 20:21   Ct Head Code Stroke Wo Contrast  Addendum Date: 12/16/2016   ADDENDUM REPORT: 12/16/2016 23:55 ADDENDUM: Findings were discussed with Dr. Rory Percy at approximately 7:40 p.m. on 12/16/2016. Please note that communication was somewhat delayed due to technical difficulties (inaccurate pager number provided as contact)). Electronically Signed   By: Jeannine Boga M.D.   On: 12/16/2016 23:55   Result Date: 12/16/2016 CLINICAL DATA:  Code stroke. Initial evaluation for acute slurred speech, left-sided weakness. EXAM: CT HEAD WITHOUT CONTRAST  TECHNIQUE: Contiguous axial images were obtained from the base of the skull through the vertex without intravenous contrast. COMPARISON:  Prior CT from 12/15/2016. FINDINGS: Brain: Acute on chronic right holo hemispheric subdural hematoma again seen, overall little interval changed in size in appearance, measuring up to 15 mm in maximal thickness. Acute on chronic left subdural hematoma also relatively stable measuring up to 7-8 mm. No evidence for interval bleeding. Persistent 5 mm right-to-left shift, similar. No hydrocephalus or ventricular trapping. Basilar cisterns are patent. Few scattered foci of periventricular hemorrhage grossly stable. Small volume hemorrhage along the posterior corpus callosum also unchanged. No other new acute intracranial hemorrhage. No acute large vessel territory infarct. Encephalomalacia out within the left temporal lobe related to remote left MCA territory infarct, unchanged. No mass lesion. Vascular: No hyperdense vessel. Skull: Scalp soft tissues and calvarium unchanged. Sinuses/Orbits: Globes and orbital soft tissues within normal limits. Paranasal sinuses and mastoids are clear. ASPECTS [facility] Stroke Program Early CT Score) - Ganglionic level infarction (caudate, lentiform nuclei, internal capsule, insula, M1-M3 cortex): 7 - Supraganglionic infarction (M4-M6 cortex): 3 Total score (0-10 with 10 being normal): 10 IMPRESSION: 1. No acute large vessel territory infarct identified. 2. ASPECTS is 10 3. Acute on chronic bilateral subdural hematomas, right larger than left, overall little interval change. Similar 5 mm right-to-left midline shift. 4. Additional few scattered foci of subcentimeter hemorrhage along the corpus callosum and periventricular white matter, also unchanged. 5. Remote left temporal lobe infarct. Dr. Rory Percy of the Stroke Neurology service was paged regarding these results at 7:28 p.m. on 12/16/2016. Currently awaiting a call back. Electronically Signed: By:  Jeannine Boga M.D. On: 12/16/2016 19:30     EKG: Independently reviewed.  Atrial fibrillation, anteroseptal infarction pattern, T-wave inversion in lateral leads.    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 Ronnald Ramp. There is no changes since yesterday. Recommend IV keppra for possible seizure. Dr. Arnoldo Morale to see patient in Am. Patient is scheduled for burr hole later next week and Dr. Arnoldo Morale to see to determine if that needs to be changed.   -will admit to SDU as inpt -  Frequent neurologic checks -Swallowing screen -f/u neurosurgeon's recommendations  Acute metabolic encephalopathy: Etiology is not clear. May be related to subdural hematoma. Neurology, Dr. Rory Percy was consulted.  - per Dr. Rory Percy start Keppra 500 BID, " as the episode may have represented seizure in the setting of SDH" - EEG at some point (can also be done as outpatient)-->eeg was ordered  Chest pain: Patient has reproducible left lower chest wall pain, most likely due to musculoskeletal pain, but the patient D-dimer is significantly elevated at 9.71. CTA of chest was done, which showed "tiny nonocclusive LEFT upper lobe segmental to subsegmental pulmonary embolus versus artifact. No RIGHT heart strain". Give his SDH, will not start blood thinner. -will check A1c, FLp -trop x 3 -prn percocet for pain -prn NTG -f/u LE doppler to r/o DVT  Atrial fibrillation with rapid ventricular response:  CHA2DS2-VASc Score is 4, needs oral anticoagulation. Patient is on Eliquis at home. Heart rate is 122 initially, but  improved later, currently blood pressure is 70-90s. His Eliquis has been on hold in the past 2 weeks for scheduled neurosurgical procedure.  -hold Eliquis -continue metoprolol  Stroke due to embolism [facility]): -continue lipitor  Chronic combined systolic and diastolic CHF (congestive heart failure) ([place]): 2-D echo on 07/13/15 showed EF of 45-50 percent with grade 1 diastolic dysfunction. Patient has no leg edema or JVD. CHF is compensated. Patient is not taking diuretics at home. -Continue metoprolol  AKI (acute kidney injury) [facility]): Initial cre 1.46, repeated BMP showed cre 1.10 -f/u renal Fx by BMP   DVT ppx: none (patient has subdural hematoma, cannot use heparin or Lovenox. Patient has positive d-dimer, need to rule out DVT before starting SCD.) Please follow up LE Doppler, if negative, please start SCD  Code Status: Full code Family Communication: Yes, patient's wife at bed side Disposition Plan:  Anticipate discharge back to previous home environment Consults called:  Neurology, Dr. Rory Percy and neurosurgeon, Dr. Ronnald Ramp Admission status: SDU/inpation       Date of Service 12/17/2016    Ivor Costa [facility] Pager [phone]  If 7PM-7AM, please contact night-coverage [email or web address] Password [email or web address] 12/17/2016, 5:50 AM

## 2016-12-16 NOTE — ED Triage Notes (Signed)
Pt arrives via [facility] as a code stroke. LKW 1825. PT was at [employer] with with wife when he became unresponsive. In route to hospital pt became more responsive but remained confused. EMS reported Left side facial droop, left arm weakness, and slurred speech. No facial droop or left sided weakness observed upon arrival to ED. Speech remains slightly slurred.

## 2016-12-16 NOTE — ED Provider Notes (Signed)
[facility] DEPT Provider Note   CSN: [id number] Arrival date & time: 12/16/16  1858   An emergency department physician performed an initial assessment on this suspected stroke patient at 1859.  History   Chief Complaint Chief Complaint  Patient presents with  . Code Stroke    HPI Phillip Solis is a 71 y.o. male hx of afib off eliquis for 3 weeks, Chronic subdural hematoma here presenting with chest pain, syncope. Patient was followed up by Dr. Arnoldo Morale from neurosurgery and had CT head yesterday that showed stable subdural hematoma. Patient was apparently at [name] earlier today and around 6:30 PM, patient had an acute onset of left-sided chest pain and subsequently he passed out. EMS was called at that time and EMS noted some left facial droop as well as left sided weakness so code stroke was activated. Patient on arrival had some slurred speech that resolved. Denies any headaches.    The history is provided by the patient and the EMS personnel.    Past Medical History:  Diagnosis Date  . Atrial fibrillation with rapid ventricular response ([place]) 07/14/2015  . History of atrial fibrillation    in setting of endoscopy in 2011; resolved  . History of palpitations   . Stroke [name])     Patient Active Problem List   Diagnosis Date Noted  . Chronic anticoagulation   . Adverse reaction to antithrombotic medication   . H/O: CVA (cerebrovascular accident)   . Subdural hematoma ([place]) 10/28/2016  . Monitoring for long-term anticoagulant use 05/31/2016  . Other fatigue 11/26/2015  . Nonischemic cardiomyopathy ([place]) 11/26/2015  . Amiodarone pulmonary toxicity   . Atrial fibrillation with rapid ventricular response ([place]) 07/14/2015  . Stroke due to embolism ([place])   . Hemiparesis, aphasia, and dysphagia as late effects of stroke ([name])   . Chronic systolic congestive heart failure ([place])   . Tachycardia   . Tachypnea   . Acute blood loss anemia   . Thrombocytopenia ([place])   .  Stroke (cerebrum) ([place]) 07/12/2015  . Acute respiratory failure with hypoxia ([name])   . Acute ischemic stroke ([name]) 07/11/2015  . Cardiomyopathy, dilated, nonischemic ([name]) 07/12/2014  . PAF (paroxysmal atrial fibrillation) ([name]) 12020-01-1913  . DOE (dyspnea on exertion) 12020-01-1913  . Tobacco abuse, in remission 12020-01-1913  . Claudication ([place]) 12020-01-1913    Past Surgical History:  Procedure Laterality Date  . LEFT AND RIGHT HEART CATHETERIZATION WITH CORONARY ANGIOGRAM N/A 07/23/2014   Procedure: LEFT AND RIGHT HEART CATHETERIZATION WITH CORONARY ANGIOGRAM;  Surgeon: [name], MD;  Location: [facility] CATH LAB;  Service: Cardiovascular;  Laterality: N/A;  . RADIOLOGY WITH ANESTHESIA N/A 07/11/2015   Procedure: RADIOLOGY WITH ANESTHESIA;  Surgeon: [name], MD;  Location: [facility];  Service: Radiology;  Laterality: N/A;       Home Medications    Prior to Admission medications   Medication Sig Start Date End Date Taking? Authorizing Provider  diphenhydrAMINE (BENADRYL) 25 MG tablet Take 25 mg by mouth every 6 (six) hours as needed for allergies.   Yes [provider]  metoprolol succinate (TOPROL-XL) 25 MG 24 hr tablet Take 1 tablet (25 mg total) by mouth daily. 11/22/16  Yes Daune Perch, NP  Multiple Vitamin (MULTIVITAMIN WITH MINERALS) TABS tablet Take 1 tablet by mouth daily.   Yes [provider]  apixaban (ELIQUIS) 5 MG TABS tablet Take 1 tablet (5 mg total) by mouth 2 (two) times daily. Patient not taking: Reported on 12/16/2016 11/15/16   Hilty,  Nadean Corwin, MD  atorvastatin (LIPITOR) 20 MG tablet Take 1 tablet (20 mg total) by mouth daily at 6 PM. Patient not taking: Reported on 12/16/2016 08/16/16   Hilty, Nadean Corwin, MD  ELIQUIS 5 MG TABS tablet TAKE ONE TABLET BY MOUTH TWICE DAILY Patient not taking: Reported on 12/16/2016 12/01/16   [name], MD    Family History Family History  Problem Relation Age of Onset  . Heart Problems Mother   . Cancer Father    . Stroke Father     Social History Social History  Substance Use Topics  . Smoking status: Former Smoker    Quit date: 03/19/1973  . Smokeless tobacco: Current User    Types: Chew  . Alcohol use 0.6 oz/week    1 Cans of beer per week     Comment: occasional beer     Allergies   No known allergies   Review of Systems Review of Systems  Cardiovascular: Positive for chest pain.  All other systems reviewed and are negative.    Physical Exam Updated Vital Signs BP 105/74   Pulse 79   Resp (!) 23   Wt 65 kg (143 lb 4.8 oz)   SpO2 97%   BMI 22.44 kg/m   Physical Exam  Constitutional: He is oriented to person, place, and time.  Uncomfortable, chronically ill   HENT:  Head: Normocephalic.  Eyes: Pupils are equal, round, and reactive to light. EOM are normal.  Neck: Normal range of motion. Neck supple.  Cardiovascular: Normal rate, regular rhythm and normal heart sounds.   Pulmonary/Chest: Effort normal and breath sounds normal. No respiratory distress. He has no wheezes.  + reproducible L sided chest tenderness   Abdominal: Soft. Bowel sounds are normal. He exhibits no distension.  Musculoskeletal: Normal range of motion.  Neurological: He is alert and oriented to person, place, and time.  ? Mild L facial droop. Strength 4/5 L side, 5/5 R side   Skin: Skin is warm.  Psychiatric: He has a normal mood and affect.  Nursing note and vitals reviewed.    ED Treatments / Results  Labs (all labs ordered are listed, but only abnormal results are displayed) Labs Reviewed  CBC - Abnormal; Notable for the following:       Result Value   Hemoglobin 12.8 (*)    All other components within normal limits  COMPREHENSIVE METABOLIC PANEL - Abnormal; Notable for the following:    CO2 13 (*)    Glucose, Bld 160 (*)    Creatinine, Ser 1.46 (*)    Total Protein 6.4 (*)    ALT <5 (*)    GFR calc non Af Amer 47 (*)    GFR calc Af Amer 54 (*)    Anion gap 19 (*)    All other  components within normal limits  I-STAT CHEM 8, ED - Abnormal; Notable for the following:    Glucose, Bld 152 (*)    Calcium, Ion 1.10 (*)    All other components within normal limits  CBG MONITORING, ED - Abnormal; Notable for the following:    Glucose-Capillary 128 (*)    All other components within normal limits  PROTIME-INR  APTT  DIFFERENTIAL  ETHANOL  RAPID URINE DRUG SCREEN, HOSP PERFORMED  URINALYSIS, ROUTINE W REFLEX MICROSCOPIC  I-STAT TROPONIN, ED    EKG  EKG Interpretation  Date/Time:  Thursday December 16 2016 19:14:48 EDT Ventricular Rate:  96 PR Interval:    QRS Duration: 98  QT Interval:  309 QTC Calculation: 391 R Axis:   27 Text Interpretation:  Atrial fibrillation Ventricular premature complex Low voltage, extremity leads Anteroseptal infarct, old Borderline repolarization abnormality No significant change since last tracing Confirmed by Wandra Arthurs [phone]) on 12/16/2016 8:02:33 PM       Radiology Ct Head Wo Contrast  Result Date: 12/15/2016 CLINICAL DATA:  Bilateral subdural hematoma EXAM: CT HEAD WITHOUT CONTRAST TECHNIQUE: Contiguous axial images were obtained from the base of the skull through the vertex without intravenous contrast. COMPARISON:  CT head 12/03/2016 FINDINGS: Brain: Right-sided subdural fluid collection slightly larger, now measuring 12 mm in thickness, compared with 11 mm previously. No new hemorrhage. Mixed density subdural fluid on the right predominately low-density. Decreasing high-density component compared with the prior study. Left-sided subdural mixed density subdural hematoma 8 mm in thickness, unchanged. No new bleeding on the left. 6 mm midline shift to the left slightly increased from the prior study when it measured 4.4 mm. Negative for hydrocephalus. No acute infarct. Chronic left MCA infarct. Vascular: Negative for hyperdense vessel Skull: Well-defined lucencies in the skull apex bilaterally appear benign and are chronic.  Sinuses/Orbits: Negative Other: None IMPRESSION: Chronic right-sided subdural hematoma slightly larger. No new bleeding. Midline shift slightly increased, now 6 mm shift to the left Left-sided mixed density subdural hematoma unchanged without evidence of new bleeding. Electronically Signed   By: Franchot Gallo M.D.   On: 12/15/2016 11:20   Dg Chest Port 1 View  Result Date: 12/16/2016 CLINICAL DATA:  Confusion.  Altered mental status. EXAM: PORTABLE CHEST 1 VIEW COMPARISON:  07/14/2015 FINDINGS: 2004 hours. The cardio pericardial silhouette is enlarged. There is pulmonary vascular congestion without overt pulmonary edema. Interstitial markings are diffusely coarsened with chronic features. The visualized bony structures of the thorax are intact. Chronic atelectasis or scarring at the left base. IMPRESSION: Borderline cardiomegaly with chronic interstitial coarsening and probable atelectasis or scarring at the left base. Electronically Signed   By: Misty Stanley M.D.   On: 12/16/2016 20:21   Ct Head Code Stroke Wo Contrast  Result Date: 12/16/2016 CLINICAL DATA:  Code stroke. Initial evaluation for acute slurred speech, left-sided weakness. EXAM: CT HEAD WITHOUT CONTRAST TECHNIQUE: Contiguous axial images were obtained from the base of the skull through the vertex without intravenous contrast. COMPARISON:  Prior CT from 12/15/2016. FINDINGS: Brain: Acute on chronic right holo hemispheric subdural hematoma again seen, overall little interval changed in size in appearance, measuring up to 15 mm in maximal thickness. Acute on chronic left subdural hematoma also relatively stable measuring up to 7-8 mm. No evidence for interval bleeding. Persistent 5 mm right-to-left shift, similar. No hydrocephalus or ventricular trapping. Basilar cisterns are patent. Few scattered foci of periventricular hemorrhage grossly stable. Small volume hemorrhage along the posterior corpus callosum also unchanged. No other new acute  intracranial hemorrhage. No acute large vessel territory infarct. Encephalomalacia out within the left temporal lobe related to remote left MCA territory infarct, unchanged. No mass lesion. Vascular: No hyperdense vessel. Skull: Scalp soft tissues and calvarium unchanged. Sinuses/Orbits: Globes and orbital soft tissues within normal limits. Paranasal sinuses and mastoids are clear. ASPECTS [facility] Stroke Program Early CT Score) - Ganglionic level infarction (caudate, lentiform nuclei, internal capsule, insula, M1-M3 cortex): 7 - Supraganglionic infarction (M4-M6 cortex): 3 Total score (0-10 with 10 being normal): 10 IMPRESSION: 1. No acute large vessel territory infarct identified. 2. ASPECTS is 10 3. Acute on chronic bilateral subdural hematomas, right larger than left, overall little interval  change. Similar 5 mm right-to-left midline shift. 4. Additional few scattered foci of subcentimeter hemorrhage along the corpus callosum and periventricular white matter, also unchanged. 5. Remote left temporal lobe infarct. Dr. Rory Percy of the Stroke Neurology service was paged regarding these results at 7:28 p.m. on 12/16/2016. Currently awaiting a call back. Electronically Signed   By: Jeannine Boga M.D.   On: 12/16/2016 19:30    Procedures Procedures (including critical care time)  Medications Ordered in ED Medications  fentaNYL (SUBLIMAZE) injection 25 mcg (25 mcg Intravenous Given 12/16/16 2050)  levETIRAcetam (KEPPRA) 1,000 mg in sodium chloride 0.9 % 100 mL IVPB (1,000 mg Intravenous New Bag/Given 12/16/16 2051)     Initial Impression / Assessment and Plan / ED Course  I have reviewed the triage vital signs and the nursing notes.  Pertinent labs & imaging results that were available during my care of the patient were reviewed by me and considered in my medical decision making (see chart for details).     Phillip Solis is a 71 y.o. male here with L sided chest pain, syncope. Code stroke activated  neuro at bedside. Will get labs, CT head, EKG, trop.   7:30 pm CT head showed chronic subdural hematoma. Dr. Lorraine Lax from neurology saw patient and canceled code stroke and recommend neurosurgery eval.   8 pm CT reviewed with neurosurgery NP, who talked to Dr. Ronnald Ramp. There is no changes since yesterday. Recommend IV keppra for possible seizure. Dr. Arnoldo Morale to see patient tomorrow. Patient is scheduled for burr hole later next week and Dr. Arnoldo Morale to see to determine if that needs to be changed.   9 pm Medicine to admit patient for chest pain, subdural hemorrhage    Final Clinical Impressions(s) / ED Diagnoses   Final diagnoses:  None    New Prescriptions New Prescriptions   No medications on file     Drenda Freeze, MD 12/16/16 2158

## 2016-12-17 ENCOUNTER — Inpatient Hospital Stay (HOSPITAL_COMMUNITY): Payer: Medicare HMO

## 2016-12-17 DIAGNOSIS — R7989 Other specified abnormal findings of blood chemistry: Secondary | ICD-10-CM

## 2016-12-17 DIAGNOSIS — R55 Syncope and collapse: Secondary | ICD-10-CM

## 2016-12-17 DIAGNOSIS — G9340 Encephalopathy, unspecified: Secondary | ICD-10-CM

## 2016-12-17 DIAGNOSIS — I6340 Cerebral infarction due to embolism of unspecified cerebral artery: Secondary | ICD-10-CM

## 2016-12-17 DIAGNOSIS — I6200 Nontraumatic subdural hemorrhage, unspecified: Secondary | ICD-10-CM

## 2016-12-17 DIAGNOSIS — G9341 Metabolic encephalopathy: Secondary | ICD-10-CM

## 2016-12-17 DIAGNOSIS — I5042 Chronic combined systolic (congestive) and diastolic (congestive) heart failure: Secondary | ICD-10-CM

## 2016-12-17 LAB — CBC
HCT: 35.6 % — ABNORMAL LOW (ref 39.0–52.0)
HEMOGLOBIN: 12 g/dL — AB (ref 13.0–17.0)
MCH: 30.2 pg (ref 26.0–34.0)
MCHC: 33.7 g/dL (ref 30.0–36.0)
MCV: 89.7 fL (ref 78.0–100.0)
PLATELETS: 195 10*3/uL (ref 150–400)
RBC: 3.97 MIL/uL — AB (ref 4.22–5.81)
RDW: 13.7 % (ref 11.5–15.5)
WBC: 6.6 10*3/uL (ref 4.0–10.5)

## 2016-12-17 LAB — BASIC METABOLIC PANEL
ANION GAP: 6 (ref 5–15)
BUN: 11 mg/dL (ref 6–20)
CALCIUM: 8.4 mg/dL — AB (ref 8.9–10.3)
CO2: 26 mmol/L (ref 22–32)
Chloride: 105 mmol/L (ref 101–111)
Creatinine, Ser: 0.92 mg/dL (ref 0.61–1.24)
Glucose, Bld: 99 mg/dL (ref 65–99)
Potassium: 4.6 mmol/L (ref 3.5–5.1)
SODIUM: 137 mmol/L (ref 135–145)

## 2016-12-17 LAB — LIPID PANEL
Cholesterol: 199 mg/dL (ref 0–200)
HDL: 30 mg/dL — AB (ref >40)
LDL Cholesterol: 153 mg/dL — ABNORMAL HIGH (ref 0–99)
Total CHOL/HDL Ratio: 6.6 RATIO
Triglycerides: 81 mg/dL (ref <150)
VLDL: 16 mg/dL (ref 0–40)

## 2016-12-17 LAB — HEMOGLOBIN A1C
HEMOGLOBIN A1C: 5.8 % — AB (ref 4.8–5.6)
MEAN PLASMA GLUCOSE: 119.76 mg/dL

## 2016-12-17 LAB — GLUCOSE, CAPILLARY
Glucose-Capillary: 75 mg/dL (ref 65–99)
Glucose-Capillary: 81 mg/dL (ref 65–99)

## 2016-12-17 LAB — TROPONIN I

## 2016-12-17 LAB — MRSA PCR SCREENING: MRSA BY PCR: NEGATIVE

## 2016-12-17 MED ORDER — SODIUM CHLORIDE 0.9 % IV BOLUS (SEPSIS)
250.0000 mL | Freq: Once | INTRAVENOUS | Status: AC
  Administered 2016-12-18: 250 mL via INTRAVENOUS

## 2016-12-17 MED ORDER — IOPAMIDOL (ISOVUE-370) INJECTION 76%
INTRAVENOUS | Status: AC
  Filled 2016-12-17: qty 100

## 2016-12-17 MED ORDER — DIGOXIN 0.25 MG/ML IJ SOLN
0.2500 mg | Freq: Once | INTRAMUSCULAR | Status: AC
  Administered 2016-12-18: 0.25 mg via INTRAVENOUS
  Filled 2016-12-17: qty 2

## 2016-12-17 MED ORDER — METOPROLOL TARTRATE 5 MG/5ML IV SOLN
2.5000 mg | Freq: Once | INTRAVENOUS | Status: AC
  Administered 2016-12-17: 2.5 mg via INTRAVENOUS
  Filled 2016-12-17: qty 5

## 2016-12-17 MED ORDER — SODIUM CHLORIDE 0.9 % IV BOLUS (SEPSIS)
250.0000 mL | Freq: Once | INTRAVENOUS | Status: AC
  Administered 2016-12-17: 250 mL via INTRAVENOUS

## 2016-12-17 MED ORDER — SODIUM CHLORIDE 0.9 % IV SOLN
500.0000 mg | Freq: Two times a day (BID) | INTRAVENOUS | Status: DC
  Administered 2016-12-17 – 2016-12-18 (×3): 500 mg via INTRAVENOUS
  Filled 2016-12-17 (×4): qty 5

## 2016-12-17 MED ORDER — IOPAMIDOL (ISOVUE-370) INJECTION 76%
100.0000 mL | Freq: Once | INTRAVENOUS | Status: AC | PRN
  Administered 2016-12-17: 100 mL via INTRAVENOUS

## 2016-12-17 NOTE — Progress Notes (Signed)
Text paged NP to notify her re: tachycardia in 110s-130s. Patient currently has no meds ordered to address

## 2016-12-17 NOTE — Progress Notes (Signed)
[facility] PROGRESS NOTE  Phillip Solis DEY:[id number] DOB: 1946-03-29 DOA: 12/16/2016 PCP: Algis Greenhouse, MD  Interim summary and HPI 71 y.o. 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 Per pt's wife, pt was LKW 1825. PT was at [employer] with wife when he became unresponsive. Pt also had left side facial droop, left arm weakness, and slurred speech. Patient with prior hx of stroke and SDH. Syncope believed associated with SDH and/or TIA. No other acute abnormalities seen on CT or MRI.  Assessment/Plan: 1-syncope: with concerns for seizure vs TIA vs associated with SDH -neurosurgery to see him -continue holding anticoagulation and heparin products -EEG neg for epilepsy; but per neurology rec's will continue Keppra -continue neuro-checks  2-metabolic encephalopathy -essentially resolved and patient mentation back to baseline  -will monitor and continue neurochecks -continue keppra per neurology   3-CP -MSK most likely  -resolved now -will monitor   4-elevated D-dimer: with CT showing presumed small PE -will hold anticoagulation and heparin products given SDH -will monitor  -currently CP free  5-hx of Stroke -continue statins -continue BP meds -no anticoagulation or antiplatelets due to SDH currently  6-chronic systolic heart failure -compensated -will follow daily weight  -strict I's and O's -continue b-blocker  7-atrial fibrillation  -chronic -will keep cardiac monitoring on board -continue b-blocker for rate control -holding anticoagulation due to SDH  Code Status: Full Family Communication: no family at bedside  Disposition Plan: patient in stepdown; will follow neurosurgery recommendations and also neurology rec's. No abnormalities on  EEG.   Consultants:  Neurology  Neurosurgery   Procedures:  See below for x-ray reports   EEG: neg for acute epilepsy   Antibiotics:  None   HPI/Subjective: Afebrile, denies CP, no nausea, no vomiting. Patient reported no HA's or blurred vision; currently with mild left facial droop, but no dysarthria. He is hungry and asking for food.  Objective: Vitals:   12/17/16 0901 12/17/16 1224  BP:  113/82  Pulse:  63  Resp:  16  Temp: 97.6 F (36.4 C) 98 F (36.7 C)  SpO2:  100%    Intake/Output Summary (Last 24 hours) at 12/17/16 1618 Last data filed at 12/17/16 0901  Gross per 24 hour  Intake              205 ml  Output                0 ml  Net              205 ml   Filed Weights   12/16/16 1930 12/17/16 0901  Weight: 65 kg (143 lb 4.8 oz) 65 kg (143 lb 4.8 oz)    Exam:   General: AAOX3, no dysarthria, patient with mild left facial droop; no HA's, no CP, no blurred vision and SOB.  Cardiovascular: irregular, rate controlled, no rubs, no gallops  Respiratory: good air movement bilaterally, no wheezing  Abdomen: soft, NT, ND, positive BS  Musculoskeletal: no edema, no cyanosis, no clubbing   Data Reviewed: Basic Metabolic Panel:  Recent Labs Lab 12/16/16 1903 12/16/16 1907 12/17/16 0327  NA 138 138 137  K 4.5 4.5 4.6  CL 106 106 105  CO2 13*  --  26  GLUCOSE 160* 152* 99  BUN 15 15 11   CREATININE 1.46* 1.10 0.92  CALCIUM 8.9  --  8.4*   Liver Function Tests:  Recent Labs Lab 12/16/16 1903  AST 35  ALT <5*  ALKPHOS 67  BILITOT 0.8  PROT 6.4*  ALBUMIN 3.5   CBC:  Recent Labs Lab 12/16/16 1903 12/16/16 1907 12/17/16 0327  WBC 7.7  --  6.6  NEUTROABS 3.9  --   --   HGB 12.8* 13.3 12.0*  HCT 39.4 39.0 35.6*  MCV 91.2  --  89.7  PLT 216  --  195   Cardiac Enzymes:  Recent Labs Lab 12/16/16 2217 12/17/16 0327 12/17/16 1000  TROPONINI <0.03 <0.03 <0.03   BNP (last 3 results)  Recent Labs  12/16/16 2217  BNP 118.9*    CBG:  Recent Labs Lab 12/16/16 1920 12/17/16 1223  GLUCAP 128* 75    Recent Results (from the past 240 hour(s))  MRSA PCR Screening     Status: None   Collection Time: 12/17/16  9:07 AM  Result Value Ref Range Status   MRSA by PCR NEGATIVE NEGATIVE Final    Comment:        The GeneXpert MRSA Assay (FDA approved for NASAL specimens only), is one component of a comprehensive MRSA colonization surveillance program. It is not intended to diagnose MRSA infection nor to guide or monitor treatment for MRSA infections.      Studies: Ct Angio Chest Pe W Or Wo Contrast  Result Date: 12/17/2016 CLINICAL DATA:  LEFT chest pain beginning at 5 p.m. EXAM: CT ANGIOGRAPHY CHEST WITH CONTRAST TECHNIQUE: Multidetector CT imaging of the chest was performed using the standard protocol during bolus administration of intravenous contrast. Multiplanar CT image reconstructions and MIPs were obtained to evaluate the vascular anatomy. CONTRAST:  75 cc Isovue 370 COMPARISON:  Chest radiograph December 16, 2016 and chest radiograph July 14, 2015 FINDINGS: CARDIOVASCULAR: Adequate contrast opacification of the pulmonary artery's. Main pulmonary artery is not enlarged. Nonocclusive pulmonary embolus LEFT upper lobe segmental the subsegmental branches seen on axial 123/284 though, not apparent on reformations, possible artifact. Heart size is mildly enlarged. No RIGHT heart strain (RV/LV .7). No pericardial effusions. Thoracic aorta is normal in course and caliber with mild calcific atherosclerosis MEDIASTINUM/NODES: No lymphadenopathy by CT size criteria. LUNGS/PLEURA: Tracheobronchial tree is patent, no pneumothorax. Mild bronchial wall thickening. Bilateral lower lobe atelectasis without focal consolidation. No pleural effusion. UPPER ABDOMEN: Included view of the abdomen is unremarkable. MUSCULOSKELETAL: Moderate to severe T6 compression fracture with 50-75% height loss, new from 2017 and, possibly acute. Review  of the MIP images confirms the above findings. IMPRESSION: 1. Tiny nonocclusive LEFT upper lobe segmental to subsegmental pulmonary embolus versus artifact. No RIGHT heart strain. 2. Moderate to severe T6 compression fracture, this may be acute. Recommend correlation with point tenderness. 3. Bronchial wall thickening seen with bronchitis or reactive airway disease. No pneumonia. Bilateral lower lobe atelectasis. Aortic Atherosclerosis (ICD10-I70.0). Electronically Signed   By: Elon Alas M.D.   On: 12/17/2016 02:01   Dg Chest Port 1 View  Result Date: 12/16/2016 CLINICAL DATA:  Confusion.  Altered mental status. EXAM: PORTABLE CHEST 1 VIEW COMPARISON:  07/14/2015 FINDINGS: 2004 hours. The cardio pericardial silhouette is enlarged. There is pulmonary vascular congestion without overt pulmonary edema. Interstitial markings are diffusely coarsened with chronic features. The visualized bony structures of the thorax are intact. Chronic atelectasis or scarring at the left base. IMPRESSION: Borderline cardiomegaly with chronic interstitial coarsening and probable atelectasis or scarring at the left base. Electronically Signed   By: Randall Hiss  Tery Sanfilippo M.D.   On: 12/16/2016 20:21   Ct Head Code Stroke Wo Contrast  Addendum Date: 12/16/2016   ADDENDUM REPORT: 12/16/2016 23:55 ADDENDUM: Findings were discussed with Dr. Rory Percy at approximately 7:40 p.m. on 12/16/2016. Please note that communication was somewhat delayed due to technical difficulties (inaccurate pager number provided as contact)). Electronically Signed   By: Jeannine Boga M.D.   On: 12/16/2016 23:55   Result Date: 12/16/2016 CLINICAL DATA:  Code stroke. Initial evaluation for acute slurred speech, left-sided weakness. EXAM: CT HEAD WITHOUT CONTRAST TECHNIQUE: Contiguous axial images were obtained from the base of the skull through the vertex without intravenous contrast. COMPARISON:  Prior CT from 12/15/2016. FINDINGS: Brain: Acute on chronic  right holo hemispheric subdural hematoma again seen, overall little interval changed in size in appearance, measuring up to 15 mm in maximal thickness. Acute on chronic left subdural hematoma also relatively stable measuring up to 7-8 mm. No evidence for interval bleeding. Persistent 5 mm right-to-left shift, similar. No hydrocephalus or ventricular trapping. Basilar cisterns are patent. Few scattered foci of periventricular hemorrhage grossly stable. Small volume hemorrhage along the posterior corpus callosum also unchanged. No other new acute intracranial hemorrhage. No acute large vessel territory infarct. Encephalomalacia out within the left temporal lobe related to remote left MCA territory infarct, unchanged. No mass lesion. Vascular: No hyperdense vessel. Skull: Scalp soft tissues and calvarium unchanged. Sinuses/Orbits: Globes and orbital soft tissues within normal limits. Paranasal sinuses and mastoids are clear. ASPECTS [facility] Stroke Program Early CT Score) - Ganglionic level infarction (caudate, lentiform nuclei, internal capsule, insula, M1-M3 cortex): 7 - Supraganglionic infarction (M4-M6 cortex): 3 Total score (0-10 with 10 being normal): 10 IMPRESSION: 1. No acute large vessel territory infarct identified. 2. ASPECTS is 10 3. Acute on chronic bilateral subdural hematomas, right larger than left, overall little interval change. Similar 5 mm right-to-left midline shift. 4. Additional few scattered foci of subcentimeter hemorrhage along the corpus callosum and periventricular white matter, also unchanged. 5. Remote left temporal lobe infarct. Dr. Rory Percy of the Stroke Neurology service was paged regarding these results at 7:28 p.m. on 12/16/2016. Currently awaiting a call back. Electronically Signed: By: Jeannine Boga M.D. On: 12/16/2016 19:30    Scheduled Meds: . atorvastatin  20 mg Oral q1800  . metoprolol succinate  25 mg Oral Daily  . multivitamin with minerals  1 tablet Oral Daily    Continuous Infusions: . levETIRAcetam 500 mg (12/17/16 0805)    Time spent: 35 minutes    Barton Dubois  [facility] Pager [phone]. If 7PM-7AM, please contact night-coverage at [email or web address], password [facility] 12/17/2016, 4:18 PM  LOS: 1 day

## 2016-12-17 NOTE — ED Notes (Signed)
Patient transported to CT 

## 2016-12-17 NOTE — Progress Notes (Signed)
EEG completed, results pending. 

## 2016-12-17 NOTE — Progress Notes (Signed)
VASCULAR LAB PRELIMINARY  PRELIMINARY  PRELIMINARY  PRELIMINARY  Bilateral lower extremity venous duplexcompleted.    Preliminary report:  Bilateral:  No evidence of DVT, superficial thrombosis, or Baker's Cyst.   Ashliegh Parekh, RVS 12/17/2016, 6:09 PM

## 2016-12-17 NOTE — Care Management Note (Signed)
Case Management Note  Patient Details  Name: GEVORG BRUM MRN: [id number] Date of Birth: [birth year]  Subjective/Objective:   From home with wife, presents with SDH, afib, CVA, CHF, acute met encephalopathy, AKI.                  Action/Plan: NCM will follow for dc needs.   Expected Discharge Date:                  Expected Discharge Plan:     In-House Referral:     Discharge planning Services  CM Consult  Post Acute Care Choice:    Choice offered to:     DME Arranged:    DME Agency:     HH Arranged:    HH Agency:     Status of Service:  In process, will continue to follow  If discussed at Long Length of Stay Meetings, dates discussed:    Additional Comments:  Zenon Mayo, RN 12/17/2016, 4:28 PM

## 2016-12-17 NOTE — Procedures (Signed)
TECHNICAL SUMMARY:  A multichannel referential and bipolar montage EEG using the standard international 10-20 system was performed.  The dominant background activity consists of an 8  hertz activity seen most prominantly over the posterior head region.  The backgound activity is reactive to eye opening and closing procedures.  Low voltage fast (beta) activity is distributed symmetrically and maximally over the anterior head regions.  ACTIVATION:  Stepwise photic stimulation and hyperventilation are not performed  EPILEPTIFORM ACTIVITY:  There were no spikes, sharp waves or paroxysmal activity.  SLEEP:  The patient spends much of the recording in drowsiness/stage I sleep architecture.  CARDIAC:  The EKG lead revealed an irregular, often bradycardic rhythm.  IMPRESSION:  This is a normal drowsy EEG for the patients stated age.  There were no focal, hemispheric or lateralizing features.  No epileptiform activity was recorded.  A normal EEG does not exclude the diagnosis of a seizure disorder and if seizure remains high on the list of differential diagnosis, an ambulatory EEG may be of value.  Clinical correlation is required.

## 2016-12-17 NOTE — ED Notes (Signed)
Back from CT

## 2016-12-18 DIAGNOSIS — I482 Chronic atrial fibrillation: Secondary | ICD-10-CM

## 2016-12-18 DIAGNOSIS — E785 Hyperlipidemia, unspecified: Secondary | ICD-10-CM

## 2016-12-18 DIAGNOSIS — R7989 Other specified abnormal findings of blood chemistry: Secondary | ICD-10-CM

## 2016-12-18 LAB — GLUCOSE, CAPILLARY: GLUCOSE-CAPILLARY: 68 mg/dL (ref 65–99)

## 2016-12-18 MED ORDER — METOPROLOL TARTRATE 25 MG PO TABS
25.0000 mg | ORAL_TABLET | Freq: Two times a day (BID) | ORAL | Status: DC
  Administered 2016-12-18 – 2016-12-20 (×5): 25 mg via ORAL
  Filled 2016-12-18 (×5): qty 1

## 2016-12-18 MED ORDER — LEVETIRACETAM 500 MG PO TABS
500.0000 mg | ORAL_TABLET | Freq: Two times a day (BID) | ORAL | Status: DC
  Administered 2016-12-18 – 2016-12-20 (×4): 500 mg via ORAL
  Filled 2016-12-18 (×4): qty 1

## 2016-12-18 NOTE — Progress Notes (Signed)
[facility] PROGRESS NOTE  ARAN MENNING XTG:[id number] DOB: [birth year] DOA: 12/16/2016 PCP: Algis Greenhouse, MD  Interim summary and HPI 71 y.o. 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 Per pt's wife, pt was LKW 1825. PT was at [employer] with wife when he became unresponsive. Pt also had left side facial droop, left arm weakness, and slurred speech. Patient with prior hx of stroke and SDH. Syncope believed associated with SDH and/or TIA. No other acute abnormalities seen on CT or MRI.  Assessment/Plan: 1-syncope: with concerns for seizure vs TIA vs associated with SDH -neurosurgery has seen patient and is considering conservative approach for now. -continue holding anticoagulation and heparin products -EEG neg for epilepsy; but per neurology rec's will continue Keppra -continue neuro-checks; will observe another 24 hours in stepdown and then transfer to floor.  2-metabolic encephalopathy -essentially resolved and patient mentation back to baseline  -will continue monitoring and continue neurochecks -continue keppra as per neurology recommendations   3-CP -MSK most likely  -resolved now -will monitor   4-elevated D-dimer: with CT showing presumed small PE -will monitor and continue holding anticoagulation and/or heparin products given SDH -no CP and no SOB currently   5-hx of Stroke -continue statins -continue BP meds -no anticoagulation or antiplatelets due to SDH currently -follow neuro-checks   6-chronic systolic heart failure -compensated and stable -patient denies SOB and is having good O2 sat on RA -no Le edema appreciated. -will continue following daily weights and I's/O's -continue b-blocker   7-atrial fibrillation  -chronic -will keep cardiac monitoring on board -will  continue use of b-blocker  -continue holding anticoagulation due to SDH  Code Status: Full Family Communication: no family at bedside  Disposition Plan: patient in stepdown; will follow neurosurgery recommendations and also neurology rec's. No abnormalities on EEG.   Consultants:  Neurology  Neurosurgery   Procedures:  See below for x-ray reports   EEG: neg for acute epilepsy   LE duplex: no SVT, DVT's or baker's cyst.  Antibiotics:  None   HPI/Subjective: Afebrile, no CP, no SOB, denies HA's, blurred vision and any other complaints.   Objective: Vitals:   12/18/16 1144 12/18/16 1445  BP: 97/76 116/81  Pulse: 97 (!) 108  Resp: 18 18  Temp: 98.6 F (37 C)   SpO2: 95% 98%    Intake/Output Summary (Last 24 hours) at 12/18/16 1637 Last data filed at 12/18/16 1300  Gross per 24 hour  Intake              940 ml  Output              725 ml  Net              215 ml   Filed Weights   12/16/16 1930 12/17/16 0901 12/18/16 0410  Weight: 65 kg (143 lb 4.8 oz) 65 kg (143 lb 4.8 oz) 62.2 kg (137 lb 2 oz)    Exam:   General: AAOX3, no dysarthria, minimal left facial droop, no HA's no blurred vision and denying CP and SOB.  Cardiovascular: irregular, no rubs, no gallops, no JVD  Respiratory: good air movement bilaterally, no wheezing, no crackles   Abdomen: soft, NT, ND, positive BS  Musculoskeletal: no edema, no cyanosis, SCD's in place.    Data Reviewed: Basic Metabolic  Panel:  Recent Labs Lab 12/16/16 1903 12/16/16 1907 12/17/16 0327  NA 138 138 137  K 4.5 4.5 4.6  CL 106 106 105  CO2 13*  --  26  GLUCOSE 160* 152* 99  BUN 15 15 11   CREATININE 1.46* 1.10 0.92  CALCIUM 8.9  --  8.4*   Liver Function Tests:  Recent Labs Lab 12/16/16 1903  AST 35  ALT <5*  ALKPHOS 67  BILITOT 0.8  PROT 6.4*  ALBUMIN 3.5   CBC:  Recent Labs Lab 12/16/16 1903 12/16/16 1907 12/17/16 0327  WBC 7.7  --  6.6  NEUTROABS 3.9  --   --   HGB 12.8* 13.3 12.0*   HCT 39.4 39.0 35.6*  MCV 91.2  --  89.7  PLT 216  --  195   Cardiac Enzymes:  Recent Labs Lab 12/16/16 2217 12/17/16 0327 12/17/16 1000  TROPONINI <0.03 <0.03 <0.03   BNP (last 3 results)  Recent Labs  12/16/16 2217  BNP 118.9*   CBG:  Recent Labs Lab 12/16/16 1920 12/17/16 1223 12/17/16 1558 12/18/16 0737  GLUCAP 128* 75 81 68    Recent Results (from the past 240 hour(s))  MRSA PCR Screening     Status: None   Collection Time: 12/17/16  9:07 AM  Result Value Ref Range Status   MRSA by PCR NEGATIVE NEGATIVE Final    Comment:        The GeneXpert MRSA Assay (FDA approved for NASAL specimens only), is one component of a comprehensive MRSA colonization surveillance program. It is not intended to diagnose MRSA infection nor to guide or monitor treatment for MRSA infections.      Studies: Ct Angio Chest Pe W Or Wo Contrast  Result Date: 12/17/2016 CLINICAL DATA:  LEFT chest pain beginning at 5 p.m. EXAM: CT ANGIOGRAPHY CHEST WITH CONTRAST TECHNIQUE: Multidetector CT imaging of the chest was performed using the standard protocol during bolus administration of intravenous contrast. Multiplanar CT image reconstructions and MIPs were obtained to evaluate the vascular anatomy. CONTRAST:  75 cc Isovue 370 COMPARISON:  Chest radiograph December 16, 2016 and chest radiograph July 14, 2015 FINDINGS: CARDIOVASCULAR: Adequate contrast opacification of the pulmonary artery's. Main pulmonary artery is not enlarged. Nonocclusive pulmonary embolus LEFT upper lobe segmental the subsegmental branches seen on axial 123/284 though, not apparent on reformations, possible artifact. Heart size is mildly enlarged. No RIGHT heart strain (RV/LV .7). No pericardial effusions. Thoracic aorta is normal in course and caliber with mild calcific atherosclerosis MEDIASTINUM/NODES: No lymphadenopathy by CT size criteria. LUNGS/PLEURA: Tracheobronchial tree is patent, no pneumothorax. Mild bronchial wall  thickening. Bilateral lower lobe atelectasis without focal consolidation. No pleural effusion. UPPER ABDOMEN: Included view of the abdomen is unremarkable. MUSCULOSKELETAL: Moderate to severe T6 compression fracture with 50-75% height loss, new from 2017 and, possibly acute. Review of the MIP images confirms the above findings. IMPRESSION: 1. Tiny nonocclusive LEFT upper lobe segmental to subsegmental pulmonary embolus versus artifact. No RIGHT heart strain. 2. Moderate to severe T6 compression fracture, this may be acute. Recommend correlation with point tenderness. 3. Bronchial wall thickening seen with bronchitis or reactive airway disease. No pneumonia. Bilateral lower lobe atelectasis. Aortic Atherosclerosis (ICD10-I70.0). Electronically Signed   By: Elon Alas M.D.   On: 12/17/2016 02:01   Dg Chest Port 1 View  Result Date: 12/16/2016 CLINICAL DATA:  Confusion.  Altered mental status. EXAM: PORTABLE CHEST 1 VIEW COMPARISON:  07/14/2015 FINDINGS: 2004 hours. The cardio pericardial silhouette is enlarged. There is  pulmonary vascular congestion without overt pulmonary edema. Interstitial markings are diffusely coarsened with chronic features. The visualized bony structures of the thorax are intact. Chronic atelectasis or scarring at the left base. IMPRESSION: Borderline cardiomegaly with chronic interstitial coarsening and probable atelectasis or scarring at the left base. Electronically Signed   By: Misty Stanley M.D.   On: 12/16/2016 20:21   Ct Head Code Stroke Wo Contrast  Addendum Date: 12/16/2016   ADDENDUM REPORT: 12/16/2016 23:55 ADDENDUM: Findings were discussed with Dr. Rory Percy at approximately 7:40 p.m. on 12/16/2016. Please note that communication was somewhat delayed due to technical difficulties (inaccurate pager number provided as contact)). Electronically Signed   By: Jeannine Boga M.D.   On: 12/16/2016 23:55   Result Date: 12/16/2016 CLINICAL DATA:  Code stroke. Initial  evaluation for acute slurred speech, left-sided weakness. EXAM: CT HEAD WITHOUT CONTRAST TECHNIQUE: Contiguous axial images were obtained from the base of the skull through the vertex without intravenous contrast. COMPARISON:  Prior CT from 12/15/2016. FINDINGS: Brain: Acute on chronic right holo hemispheric subdural hematoma again seen, overall little interval changed in size in appearance, measuring up to 15 mm in maximal thickness. Acute on chronic left subdural hematoma also relatively stable measuring up to 7-8 mm. No evidence for interval bleeding. Persistent 5 mm right-to-left shift, similar. No hydrocephalus or ventricular trapping. Basilar cisterns are patent. Few scattered foci of periventricular hemorrhage grossly stable. Small volume hemorrhage along the posterior corpus callosum also unchanged. No other new acute intracranial hemorrhage. No acute large vessel territory infarct. Encephalomalacia out within the left temporal lobe related to remote left MCA territory infarct, unchanged. No mass lesion. Vascular: No hyperdense vessel. Skull: Scalp soft tissues and calvarium unchanged. Sinuses/Orbits: Globes and orbital soft tissues within normal limits. Paranasal sinuses and mastoids are clear. ASPECTS [facility] Stroke Program Early CT Score) - Ganglionic level infarction (caudate, lentiform nuclei, internal capsule, insula, M1-M3 cortex): 7 - Supraganglionic infarction (M4-M6 cortex): 3 Total score (0-10 with 10 being normal): 10 IMPRESSION: 1. No acute large vessel territory infarct identified. 2. ASPECTS is 10 3. Acute on chronic bilateral subdural hematomas, right larger than left, overall little interval change. Similar 5 mm right-to-left midline shift. 4. Additional few scattered foci of subcentimeter hemorrhage along the corpus callosum and periventricular white matter, also unchanged. 5. Remote left temporal lobe infarct. Dr. Rory Percy of the Stroke Neurology service was paged regarding these results at  7:28 p.m. on 12/16/2016. Currently awaiting a call back. Electronically Signed: By: Jeannine Boga M.D. On: 12/16/2016 19:30    Scheduled Meds: . atorvastatin  20 mg Oral q1800  . levETIRAcetam  500 mg Oral BID  . metoprolol tartrate  25 mg Oral BID  . multivitamin with minerals  1 tablet Oral Daily   Continuous Infusions:   Time spent: 30 minutes    Barton Dubois  [facility] Pager [phone]. If 7PM-7AM, please contact night-coverage at [email or web address], password [facility] 12/18/2016, 4:37 PM  LOS: 2 days

## 2016-12-18 NOTE — Consult Note (Signed)
Reason for Consult: Subdural hematoma Referring Physician: Carnella Guadalajara M.D.  Phillip Solis is an 71 y.o. male.  HPI: Patient is 71 year old individual who's had a chronic subdural hematoma bilaterally but worse on the right than on the left since June of this tissue. He's been followed by Dr. Earle Gell and clinically has remained stable. This past Thursday he had an episode of weakness on the left side. He describes that he had shaking in the right upper and right lower extremity. This is not documented elsewhere. The admission note also notes that he became unresponsive that is he lost consciousness though the patient denies this. His wife does not corroborate that he lost consciousness. 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  His subdural hematomas have remained relatively stable measuring between 12 and 14 mm with proximally for 5 mm of shift right to left. His last scan demonstrates that his subdural hematomas now 15 mm with approximately the same degree of shift.  Currently the patient is awake alert he moves his right upper and right lower extremities well but he does complain of pain in the left posterior chest wall making it difficult to lift his left arm. His strength on that side however appears to be intact.  Past Medical History:  Diagnosis Date  . Atrial fibrillation with rapid ventricular response ([place]) 07/14/2015  . History of atrial fibrillation    in setting of endoscopy in 2011; resolved  . History of palpitations   . Stroke [facility])     Past Surgical History:  Procedure Laterality Date  . LEFT AND RIGHT HEART CATHETERIZATION WITH CORONARY ANGIOGRAM N/A 07/23/2014   Procedure: LEFT AND RIGHT HEART CATHETERIZATION WITH CORONARY ANGIOGRAM;  Surgeon: [name], MD;  Location: [facility] CATH LAB;  Service: Cardiovascular;  Laterality: N/A;  . RADIOLOGY WITH ANESTHESIA N/A 07/11/2015   Procedure:  RADIOLOGY WITH ANESTHESIA;  Surgeon: [name], MD;  Location: [place];  Service: Radiology;  Laterality: N/A;    Family History  Problem Relation Age of Onset  . Heart Problems Mother   . Cancer Father   . Stroke Father     Social History:  reports that he quit smoking about 43 years ago. His smokeless tobacco use includes Chew. He reports that he drinks about 0.6 oz of alcohol per week . He reports that he does not use drugs.  Allergies:  Allergies  Allergen Reactions  . No Known Allergies     Medications: I have reviewed the patient's current medications.  Results for orders placed or performed during the hospital encounter of 12/16/16 (from the past 48 hour(s))  Protime-INR     Status: None   Collection Time: 12/16/16  7:03 PM  Result Value Ref Range   Prothrombin Time 14.0 11.4 - 15.2 seconds   INR 1.08   APTT     Status: None   Collection Time: 12/16/16  7:03 PM  Result Value Ref Range   aPTT 31 24 - 36 seconds  CBC     Status: Abnormal   Collection Time: 12/16/16  7:03 PM  Result Value Ref Range   WBC 7.7 4.0 - 10.5 K/uL   RBC 4.32 4.22 - 5.81 MIL/uL   Hemoglobin 12.8 (L) 13.0 - 17.0 g/dL   HCT 39.4 39.0 - 52.0 %   MCV 91.2 78.0 - 100.0 fL   MCH 29.6 26.0 - 34.0 pg   MCHC 32.5 30.0 - 36.0  g/dL   RDW 13.7 11.5 - 15.5 %   Platelets 216 150 - 400 K/uL  Differential     Status: None   Collection Time: 12/16/16  7:03 PM  Result Value Ref Range   Neutrophils Relative % 50 %   Neutro Abs 3.9 1.7 - 7.7 K/uL   Lymphocytes Relative 41 %   Lymphs Abs 3.1 0.7 - 4.0 K/uL   Monocytes Relative 5 %   Monocytes Absolute 0.4 0.1 - 1.0 K/uL   Eosinophils Relative 3 %   Eosinophils Absolute 0.2 0.0 - 0.7 K/uL   Basophils Relative 1 %   Basophils Absolute 0.0 0.0 - 0.1 K/uL  Comprehensive metabolic panel     Status: Abnormal   Collection Time: 12/16/16  7:03 PM  Result Value Ref Range   Sodium 138 135 - 145 mmol/L   Potassium 4.5 3.5 - 5.1 mmol/L   Chloride 106 101  - 111 mmol/L   CO2 13 (L) 22 - 32 mmol/L   Glucose, Bld 160 (H) 65 - 99 mg/dL   BUN 15 6 - 20 mg/dL   Creatinine, Ser 1.46 (H) 0.61 - 1.24 mg/dL   Calcium 8.9 8.9 - 10.3 mg/dL   Total Protein 6.4 (L) 6.5 - 8.1 g/dL   Albumin 3.5 3.5 - 5.0 g/dL   AST 35 15 - 41 U/L   ALT <5 (L) 17 - 63 U/L    Comment: REPEATED TO VERIFY   Alkaline Phosphatase 67 38 - 126 U/L   Total Bilirubin 0.8 0.3 - 1.2 mg/dL   GFR calc non Af Amer 47 (L) >60 mL/min   GFR calc Af Amer 54 (L) >60 mL/min    Comment: (NOTE) The eGFR has been calculated using the CKD EPI equation. This calculation has not been validated in all clinical situations. eGFR's persistently <60 mL/min signify possible Chronic Kidney Disease.    Anion gap 19 (H) 5 - 15  I-stat troponin, ED (not at [facility], [facility])     Status: None   Collection Time: 12/16/16  7:05 PM  Result Value Ref Range   Troponin i, poc 0.01 0.00 - 0.08 ng/mL   Comment 3            Comment: Due to the release kinetics of cTnI, a negative result within the first hours of the onset of symptoms does not rule out myocardial infarction with certainty. If myocardial infarction is still suspected, repeat the test at appropriate intervals.   I-Stat Chem 8, ED  (not at [facility], [facility])     Status: Abnormal   Collection Time: 12/16/16  7:07 PM  Result Value Ref Range   Sodium 138 135 - 145 mmol/L   Potassium 4.5 3.5 - 5.1 mmol/L   Chloride 106 101 - 111 mmol/L   BUN 15 6 - 20 mg/dL   Creatinine, Ser 1.10 0.61 - 1.24 mg/dL   Glucose, Bld 152 (H) 65 - 99 mg/dL   Calcium, Ion 1.10 (L) 1.15 - 1.40 mmol/L   TCO2 14 0 - 100 mmol/L   Hemoglobin 13.3 13.0 - 17.0 g/dL   HCT 39.0 39.0 - 52.0 %  CBG monitoring, ED     Status: Abnormal   Collection Time: 12/16/16  7:20 PM  Result Value Ref Range   Glucose-Capillary 128 (H) 65 - 99 mg/dL  Urine rapid drug screen (hosp performed)not at [facility]     Status: None   Collection Time: 12/16/16  8:48 PM  Result Value Ref Range  Opiates NONE  DETECTED NONE DETECTED   Cocaine NONE DETECTED NONE DETECTED   Benzodiazepines NONE DETECTED NONE DETECTED   Amphetamines NONE DETECTED NONE DETECTED   Tetrahydrocannabinol NONE DETECTED NONE DETECTED   Barbiturates NONE DETECTED NONE DETECTED    Comment:        DRUG SCREEN FOR MEDICAL PURPOSES ONLY.  IF CONFIRMATION IS NEEDED FOR ANY PURPOSE, NOTIFY LAB WITHIN 5 DAYS.        LOWEST DETECTABLE LIMITS FOR URINE DRUG SCREEN Drug Class       Cutoff (ng/mL) Amphetamine      1000 Barbiturate      200 Benzodiazepine   597 Tricyclics       416 Opiates          300 Cocaine          300 THC              50   Urinalysis, Routine w reflex microscopic     Status: Abnormal   Collection Time: 12/16/16  8:48 PM  Result Value Ref Range   Color, Urine YELLOW YELLOW   APPearance HAZY (A) CLEAR   Specific Gravity, Urine 1.021 1.005 - 1.030   pH 5.0 5.0 - 8.0   Glucose, UA NEGATIVE NEGATIVE mg/dL   Hgb urine dipstick NEGATIVE NEGATIVE   Bilirubin Urine NEGATIVE NEGATIVE   Ketones, ur NEGATIVE NEGATIVE mg/dL   Protein, ur 30 (A) NEGATIVE mg/dL   Nitrite NEGATIVE NEGATIVE   Leukocytes, UA NEGATIVE NEGATIVE   RBC / HPF 0-5 0 - 5 RBC/hpf   WBC, UA 0-5 0 - 5 WBC/hpf   Bacteria, UA RARE (A) NONE SEEN   Squamous Epithelial / LPF NONE SEEN NONE SEEN   Mucous PRESENT    Hyaline Casts, UA PRESENT   Ethanol     Status: None   Collection Time: 12/16/16 10:17 PM  Result Value Ref Range   Alcohol, Ethyl (B) <5 <5 mg/dL    Comment:        LOWEST DETECTABLE LIMIT FOR SERUM ALCOHOL IS 5 mg/dL FOR MEDICAL PURPOSES ONLY   D-dimer, quantitative (not at [facility])     Status: Abnormal   Collection Time: 12/16/16 10:17 PM  Result Value Ref Range   D-Dimer, Quant 9.71 (H) 0.00 - 0.50 ug/mL-FEU    Comment: (NOTE) At the manufacturer cut-off of 0.50 ug/mL FEU, this assay has been documented to exclude PE with a sensitivity and negative predictive value of 97 to 99%.  At this time, this assay has not been  approved by the FDA to exclude DVT/VTE. Results should be correlated with clinical presentation.   Troponin I (q 6hr x 3)     Status: None   Collection Time: 12/16/16 10:17 PM  Result Value Ref Range   Troponin I <0.03 <0.03 ng/mL  Brain natriuretic peptide     Status: Abnormal   Collection Time: 12/16/16 10:17 PM  Result Value Ref Range   B Natriuretic Peptide 118.9 (H) 0.0 - 100.0 pg/mL  Hemoglobin A1c     Status: Abnormal   Collection Time: 12/17/16  3:27 AM  Result Value Ref Range   Hgb A1c MFr Bld 5.8 (H) 4.8 - 5.6 %    Comment: (NOTE) Pre diabetes:          5.7%-6.4% Diabetes:              >6.4% Glycemic control for   <7.0% adults with diabetes    Mean Plasma Glucose 119.76 mg/dL  Lipid panel     Status: Abnormal   Collection Time: 12/17/16  3:27 AM  Result Value Ref Range   Cholesterol 199 0 - 200 mg/dL   Triglycerides 81 <150 mg/dL   HDL 30 (L) >40 mg/dL   Total CHOL/HDL Ratio 6.6 RATIO   VLDL 16 0 - 40 mg/dL   LDL Cholesterol 153 (H) 0 - 99 mg/dL    Comment:        Total Cholesterol/HDL:CHD Risk Coronary Heart Disease Risk Table                     Men   Women  1/2 Average Risk   3.4   3.3  Average Risk       5.0   4.4  2 X Average Risk   9.6   7.1  3 X Average Risk  23.4   11.0        Use the calculated Patient Ratio above and the CHD Risk Table to determine the patient's CHD Risk.        ATP III CLASSIFICATION (LDL):  <100     mg/dL   Optimal  100-129  mg/dL   Near or Above                    Optimal  130-159  mg/dL   Borderline  160-189  mg/dL   High  >190     mg/dL   Very High   Troponin I (q 6hr x 3)     Status: None   Collection Time: 12/17/16  3:27 AM  Result Value Ref Range   Troponin I <0.03 <0.03 ng/mL  Basic metabolic panel     Status: Abnormal   Collection Time: 12/17/16  3:27 AM  Result Value Ref Range   Sodium 137 135 - 145 mmol/L   Potassium 4.6 3.5 - 5.1 mmol/L   Chloride 105 101 - 111 mmol/L   CO2 26 22 - 32 mmol/L   Glucose,  Bld 99 65 - 99 mg/dL   BUN 11 6 - 20 mg/dL   Creatinine, Ser 0.92 0.61 - 1.24 mg/dL   Calcium 8.4 (L) 8.9 - 10.3 mg/dL   GFR calc non Af Amer >60 >60 mL/min   GFR calc Af Amer >60 >60 mL/min    Comment: (NOTE) The eGFR has been calculated using the CKD EPI equation. This calculation has not been validated in all clinical situations. eGFR's persistently <60 mL/min signify possible Chronic Kidney Disease.    Anion gap 6 5 - 15  CBC     Status: Abnormal   Collection Time: 12/17/16  3:27 AM  Result Value Ref Range   WBC 6.6 4.0 - 10.5 K/uL   RBC 3.97 (L) 4.22 - 5.81 MIL/uL   Hemoglobin 12.0 (L) 13.0 - 17.0 g/dL   HCT 35.6 (L) 39.0 - 52.0 %   MCV 89.7 78.0 - 100.0 fL   MCH 30.2 26.0 - 34.0 pg   MCHC 33.7 30.0 - 36.0 g/dL   RDW 13.7 11.5 - 15.5 %   Platelets 195 150 - 400 K/uL  MRSA PCR Screening     Status: None   Collection Time: 12/17/16  9:07 AM  Result Value Ref Range   MRSA by PCR NEGATIVE NEGATIVE    Comment:        The GeneXpert MRSA Assay (FDA approved for NASAL specimens only), is one component of a comprehensive MRSA colonization surveillance program. It is not  intended to diagnose MRSA infection nor to guide or monitor treatment for MRSA infections.   Troponin I (q 6hr x 3)     Status: None   Collection Time: 12/17/16 10:00 AM  Result Value Ref Range   Troponin I <0.03 <0.03 ng/mL  Glucose, capillary     Status: None   Collection Time: 12/17/16 12:23 PM  Result Value Ref Range   Glucose-Capillary 75 65 - 99 mg/dL  Glucose, capillary     Status: None   Collection Time: 12/17/16  3:58 PM  Result Value Ref Range   Glucose-Capillary 81 65 - 99 mg/dL  Glucose, capillary     Status: None   Collection Time: 12/18/16  7:37 AM  Result Value Ref Range   Glucose-Capillary 68 65 - 99 mg/dL    Ct Angio Chest Pe W Or Wo Contrast  Result Date: 12/17/2016 CLINICAL DATA:  LEFT chest pain beginning at 5 p.m. EXAM: CT ANGIOGRAPHY CHEST WITH CONTRAST TECHNIQUE:  Multidetector CT imaging of the chest was performed using the standard protocol during bolus administration of intravenous contrast. Multiplanar CT image reconstructions and MIPs were obtained to evaluate the vascular anatomy. CONTRAST:  75 cc Isovue 370 COMPARISON:  Chest radiograph December 16, 2016 and chest radiograph July 14, 2015 FINDINGS: CARDIOVASCULAR: Adequate contrast opacification of the pulmonary artery's. Main pulmonary artery is not enlarged. Nonocclusive pulmonary embolus LEFT upper lobe segmental the subsegmental branches seen on axial 123/284 though, not apparent on reformations, possible artifact. Heart size is mildly enlarged. No RIGHT heart strain (RV/LV .7). No pericardial effusions. Thoracic aorta is normal in course and caliber with mild calcific atherosclerosis MEDIASTINUM/NODES: No lymphadenopathy by CT size criteria. LUNGS/PLEURA: Tracheobronchial tree is patent, no pneumothorax. Mild bronchial wall thickening. Bilateral lower lobe atelectasis without focal consolidation. No pleural effusion. UPPER ABDOMEN: Included view of the abdomen is unremarkable. MUSCULOSKELETAL: Moderate to severe T6 compression fracture with 50-75% height loss, new from 2017 and, possibly acute. Review of the MIP images confirms the above findings. IMPRESSION: 1. Tiny nonocclusive LEFT upper lobe segmental to subsegmental pulmonary embolus versus artifact. No RIGHT heart strain. 2. Moderate to severe T6 compression fracture, this may be acute. Recommend correlation with point tenderness. 3. Bronchial wall thickening seen with bronchitis or reactive airway disease. No pneumonia. Bilateral lower lobe atelectasis. Aortic Atherosclerosis (ICD10-I70.0). Electronically Signed   By: Elon Alas M.D.   On: 12/17/2016 02:01   Dg Chest Port 1 View  Result Date: 12/16/2016 CLINICAL DATA:  Confusion.  Altered mental status. EXAM: PORTABLE CHEST 1 VIEW COMPARISON:  07/14/2015 FINDINGS: 2004 hours. The cardio pericardial  silhouette is enlarged. There is pulmonary vascular congestion without overt pulmonary edema. Interstitial markings are diffusely coarsened with chronic features. The visualized bony structures of the thorax are intact. Chronic atelectasis or scarring at the left base. IMPRESSION: Borderline cardiomegaly with chronic interstitial coarsening and probable atelectasis or scarring at the left base. Electronically Signed   By: Misty Stanley M.D.   On: 12/16/2016 20:21   Ct Head Code Stroke Wo Contrast  Addendum Date: 12/16/2016   ADDENDUM REPORT: 12/16/2016 23:55 ADDENDUM: Findings were discussed with Dr. Rory Percy at approximately 7:40 p.m. on 12/16/2016. Please note that communication was somewhat delayed due to technical difficulties (inaccurate pager number provided as contact)). Electronically Signed   By: Jeannine Boga M.D.   On: 12/16/2016 23:55   Result Date: 12/16/2016 CLINICAL DATA:  Code stroke. Initial evaluation for acute slurred speech, left-sided weakness. EXAM: CT HEAD WITHOUT CONTRAST TECHNIQUE: Contiguous  axial images were obtained from the base of the skull through the vertex without intravenous contrast. COMPARISON:  Prior CT from 12/15/2016. FINDINGS: Brain: Acute on chronic right holo hemispheric subdural hematoma again seen, overall little interval changed in size in appearance, measuring up to 15 mm in maximal thickness. Acute on chronic left subdural hematoma also relatively stable measuring up to 7-8 mm. No evidence for interval bleeding. Persistent 5 mm right-to-left shift, similar. No hydrocephalus or ventricular trapping. Basilar cisterns are patent. Few scattered foci of periventricular hemorrhage grossly stable. Small volume hemorrhage along the posterior corpus callosum also unchanged. No other new acute intracranial hemorrhage. No acute large vessel territory infarct. Encephalomalacia out within the left temporal lobe related to remote left MCA territory infarct, unchanged. No mass  lesion. Vascular: No hyperdense vessel. Skull: Scalp soft tissues and calvarium unchanged. Sinuses/Orbits: Globes and orbital soft tissues within normal limits. Paranasal sinuses and mastoids are clear. ASPECTS [facility] Stroke Program Early CT Score) - Ganglionic level infarction (caudate, lentiform nuclei, internal capsule, insula, M1-M3 cortex): 7 - Supraganglionic infarction (M4-M6 cortex): 3 Total score (0-10 with 10 being normal): 10 IMPRESSION: 1. No acute large vessel territory infarct identified. 2. ASPECTS is 10 3. Acute on chronic bilateral subdural hematomas, right larger than left, overall little interval change. Similar 5 mm right-to-left midline shift. 4. Additional few scattered foci of subcentimeter hemorrhage along the corpus callosum and periventricular white matter, also unchanged. 5. Remote left temporal lobe infarct. Dr. Rory Percy of the Stroke Neurology service was paged regarding these results at 7:28 p.m. on 12/16/2016. Currently awaiting a call back. Electronically Signed: By: Jeannine Boga M.D. On: 12/16/2016 19:30    Review of Systems  Genitourinary: Positive for flank pain.  Musculoskeletal: Positive for back pain.  Neurological: Positive for dizziness, tingling, tremors, speech change and weakness.       History of stroke last year   Blood pressure 97/76, pulse 97, temperature 98.6 F (37 C), resp. rate 18, height _0  (1.626 m), weight 62.2 kg (137 lb 2 oz), SpO2 95 %. Physical Exam  Constitutional: He is oriented to person, place, and time.  Cachectic appearing individual  HENT:  Head: Normocephalic and atraumatic.  Eyes: Pupils are equal, round, and reactive to light. Conjunctivae and EOM are normal.  Neck:  Neck is relatively stiff with less than 50% normal range of motion  Neurological: He is alert and oriented to person, place, and time.  No evidence of left-sided weakness at this time. Motor function appears good in the upper and lower extremities.  Skin:  Skin is warm.  Psychiatric: He has a normal mood and affect. His behavior is normal. Judgment and thought content normal.    Assessment/Plan: At this point the patient appears neurologically stable. He does have a lateral subdural hematomas much bigger on the right than on the left. Given his situation I believe he needs to be off anticoagulants in order for this process to heal either surgically or nonsurgically. I would prefer to avoid surgery on this individual given that he appears to have acute on chronic subdural hematomas now and draining these through a simple burr hole may not be an easy proposition. I discussed the situation with the patient his son and his wife were present. We will proceed cautiously but conservatively.  Nikkole Placzek J 12/18/2016, 2:41 PM

## 2016-12-19 DIAGNOSIS — R7989 Other specified abnormal findings of blood chemistry: Secondary | ICD-10-CM

## 2016-12-19 DIAGNOSIS — N179 Acute kidney failure, unspecified: Secondary | ICD-10-CM

## 2016-12-19 DIAGNOSIS — I4891 Unspecified atrial fibrillation: Secondary | ICD-10-CM

## 2016-12-19 LAB — GLUCOSE, CAPILLARY: Glucose-Capillary: 88 mg/dL (ref 65–99)

## 2016-12-19 NOTE — Progress Notes (Signed)
[facility] PROGRESS NOTE  Phillip Solis TOI:[id number] DOB: 02-26-1946 DOA: 12/16/2016 PCP: Algis Greenhouse, MD  Interim summary and HPI 71 y.o. male with medical history significant of SDH, ischemia stroke, HLD, A fib on Eliquis (on hold in the past 2 weeks), chronic combined systolic and diastolic CHF with EF of 83-38%, who presents with unresponsiveness and chest pain. Per pt's wife, pt was LKW 1825. PT was at [employer] with wife when he became unresponsive. Pt also had left side facial droop, left arm weakness, and slurred speech. Patient with prior hx of stroke and SDH. Syncope believed associated with SDH and/or TIA. No other acute abnormalities seen on CT or MRI.  Assessment/Plan: 1-syncope: with concerns for seizure vs TIA vs associated with SDH -neurosurgery has seen patient and is considering conservative approach for now. Will follow rec's -continue holding anticoagulation and heparin products -EEG neg for epilepsy; but per neurology rec's will continue Keppra -patient has remained stable and in no distress or with any new focal deficit. -will transfer to telemetry  -will ask for PT/OT evaluation  2-metabolic encephalopathy -essentially resolved and patient mentation back to baseline  -will continue monitoring and continue neurochecks -continue keppra as per neurology recommendations   3-CP: intermittent and reproducible with certain left ar movements  -MSK most likely  -continue PRN analgesics and muscle relaxer   4-elevated D-dimer: with CT showing presumed small PE vs artifact -will monitor and continue holding anticoagulation and/or heparin products given SDH -no CP and no SOB currently  -patient with neg DVT on LE duplex, afebrile, no tachycardic, no tachypnea and with good O2 sat on RA.  5-hx of Stroke -continue statins -continue BP meds -no anticoagulation or antiplatelets due to SDH currently -no new neurologic changes  6-chronic systolic heart  failure -compensated and stable -patient denies SOB and is having good O2 sat on RA -no Le edema appreciated. -will continue following daily weights and I's/O's -continue b-blocker   7-atrial fibrillation  -chronic -will keep cardiac monitoring on board for now -rate controlled; will continue b-blocker. -continue holding anticoagulation due to SDH  Code Status: Full Family Communication: no family at bedside  Disposition Plan: patient would be transfer to telemetry bed; will follow neurosurgery recommendations and also neurology rec's. No abnormalities on EEG.   Consultants:  Neurology  Neurosurgery   Procedures:  See below for x-ray reports   EEG: neg for acute epilepsy   LE duplex: no SVT, DVT's or baker's cyst.  Antibiotics:  None   HPI/Subjective: Afebrile, no CP, no SOB. No new focal neurologic deficit.  Objective: Vitals:   12/19/16 1234 12/19/16 1651  BP: 102/62 122/79  Pulse:    Resp:    Temp: 98.1 F (36.7 C) 98.5 F (36.9 C)  SpO2:      Intake/Output Summary (Last 24 hours) at 12/19/16 1716 Last data filed at 12/18/16 2250  Gross per 24 hour  Intake               40 ml  Output              325 ml  Net             -285 ml   Filed Weights   12/17/16 0901 12/18/16 0410 12/19/16 0441  Weight: 65 kg (143 lb 4.8 oz) 62.2 kg (137 lb 2 oz) 62 kg (136 lb 11 oz)    Exam:   General: AAOX3, no dysarthria, very minimal left facial droop, no HA's, no  blurred vision and denies SOB and CP currently.   Cardiovascular: rate control, irregular, no rubs, no gallops, no JVD  Respiratory: good air movement, no wheezing, no crackles. Denies discomfort with deep breath.   Abdomen: soft, NT, ND, positive BS  Musculoskeletal: no edema, no cyanosis, no clubbing. SCD's in place.    Data Reviewed: Basic Metabolic Panel:  Recent Labs Lab 12/16/16 1903 12/16/16 1907 12/17/16 0327  NA 138 138 137  K 4.5 4.5 4.6  CL 106 106 105  CO2 13*  --  26   GLUCOSE 160* 152* 99  BUN 15 15 11   CREATININE 1.46* 1.10 0.92  CALCIUM 8.9  --  8.4*   Liver Function Tests:  Recent Labs Lab 12/16/16 1903  AST 35  ALT <5*  ALKPHOS 67  BILITOT 0.8  PROT 6.4*  ALBUMIN 3.5   CBC:  Recent Labs Lab 12/16/16 1903 12/16/16 1907 12/17/16 0327  WBC 7.7  --  6.6  NEUTROABS 3.9  --   --   HGB 12.8* 13.3 12.0*  HCT 39.4 39.0 35.6*  MCV 91.2  --  89.7  PLT 216  --  195   Cardiac Enzymes:  Recent Labs Lab 12/16/16 2217 12/17/16 0327 12/17/16 1000  TROPONINI <0.03 <0.03 <0.03   BNP (last 3 results)  Recent Labs  12/16/16 2217  BNP 118.9*   CBG:  Recent Labs Lab 12/16/16 1920 12/17/16 1223 12/17/16 1558 12/18/16 0737 12/19/16 0726  GLUCAP 128* 75 81 68 88    Recent Results (from the past 240 hour(s))  MRSA PCR Screening     Status: None   Collection Time: 12/17/16  9:07 AM  Result Value Ref Range Status   MRSA by PCR NEGATIVE NEGATIVE Final    Comment:        The GeneXpert MRSA Assay (FDA approved for NASAL specimens only), is one component of a comprehensive MRSA colonization surveillance program. It is not intended to diagnose MRSA infection nor to guide or monitor treatment for MRSA infections.      Studies: No results found.  Scheduled Meds: . atorvastatin  20 mg Oral q1800  . levETIRAcetam  500 mg Oral BID  . metoprolol tartrate  25 mg Oral BID  . multivitamin with minerals  1 tablet Oral Daily   Continuous Infusions:   Time spent: 25 minutes    Barton Dubois  [facility] Pager [phone]. If 7PM-7AM, please contact night-coverage at [email or web address], password [email or web address] 12/19/2016, 5:16 PM  LOS: 3 days

## 2016-12-20 DIAGNOSIS — R0789 Other chest pain: Secondary | ICD-10-CM

## 2016-12-20 LAB — GLUCOSE, CAPILLARY: Glucose-Capillary: 104 mg/dL — ABNORMAL HIGH (ref 65–99)

## 2016-12-20 MED ORDER — DIPHENHYDRAMINE HCL 25 MG PO TABS: 25.0000 mg | ORAL_TABLET | Freq: Three times a day (TID) | ORAL | Status: DC | PRN

## 2016-12-20 MED ORDER — APIXABAN 5 MG PO TABS: 5.0000 mg | ORAL_TABLET | Freq: Two times a day (BID) | ORAL | Status: DC

## 2016-12-20 MED ORDER — LEVETIRACETAM 500 MG PO TABS
500.0000 mg | ORAL_TABLET | Freq: Two times a day (BID) | ORAL | 0 refills | Status: DC
Start: 2016-12-20 — End: 2017-02-23

## 2016-12-20 NOTE — Progress Notes (Signed)
Patient given discharge instructions. Family at bedside.  All questions and concerns addressed. Patient left unit by wheelchair, accompanied by staff.

## 2016-12-20 NOTE — Care Management Note (Signed)
Case Management Note  Patient Details  Name: Phillip Solis MRN: [id number] Date of Birth: 12/20/1945  Subjective/Objective:                    Action/Plan: PT recommending outpatient therapy. CM following for d/c needs, physician orders.   Expected Discharge Date:  12/19/16               Expected Discharge Plan:  OP Rehab  In-House Referral:     Discharge planning Services  CM Consult  Post Acute Care Choice:    Choice offered to:     DME Arranged:    DME Agency:     HH Arranged:    HH Agency:     Status of Service:  In process, will continue to follow  If discussed at Long Length of Stay Meetings, dates discussed:    Additional Comments:  Pollie Friar, RN 12/20/2016, 10:46 AM

## 2016-12-20 NOTE — Care Management Important Message (Signed)
Important Message  Patient Details  Name: Phillip Solis MRN: [id number] Date of Birth: Feb 18, 1946   Medicare Important Message Given:  Yes    Dontre Laduca [address] 12/20/2016, 11:32 AM

## 2016-12-20 NOTE — Progress Notes (Signed)
Patient ID: Phillip Solis, male   DOB: 1945/07/25, 71 y.o.   MRN: [id number] Vital signs are stable Patient is ambulatory No sign of left-sided or right-sided weakness at this time From a neurosurgical standpoint patient can be discharged Follow-up community with Dr. Arnoldo Morale as an outpatient

## 2016-12-20 NOTE — Evaluation (Signed)
Physical Therapy Evaluation Patient Details Name: Phillip Solis MRN: [id number] DOB: Jan 01, 1946 Today's Date: 12/20/2016   History of Present Illness  Pt is a 71 y/o male with a PMH significant for R and L sided heart failure and A-fib, chronic SDH bilaterally but worse on the R vs L since 10/2016. On 12/16/2016 pt had an episode of L sided weakness while out to eat with his wife. Per MD report SDH have remained relatively stable. Differential diagnosis includes seizure, TIA, and 2 to SDH.   Clinical Impression  Pt admitted with above diagnosis. Pt currently with functional limitations due to the deficits listed below (see PT Problem List). At the time of PT eval pt was able to perform transfers and ambulation with min guard to min assist for balance support and safety. Pt reports that he is near baseline, however his "new" baseline is R sided weakness s/p previous CVA and with expressive difficulties. Overall moving well. Will keep on caseload until d/c or PT goals are met. Pt will benefit from skilled PT to increase their independence and safety with mobility to allow discharge to the venue listed below.       Follow Up Recommendations Outpatient PT;Supervision for mobility/OOB    Equipment Recommendations  None recommended by PT    Recommendations for Other Services       Precautions / Restrictions Precautions Precautions: Fall Precaution Comments: Residual weakness from prior CVA on R.  Restrictions Weight Bearing Restrictions: No      Mobility  Bed Mobility Overal bed mobility: Needs Assistance Bed Mobility: Supine to Sit     Supine to sit: Min assist     General bed mobility comments: Pt able to transition to EOB with assist for trunk support during scooting activity (posterior lean noted). Increased time to get feet on floor.   Transfers Overall transfer level: Needs assistance Equipment used: 1 person hand held assist Transfers: Sit to/from Stand Sit to Stand: Min  guard         General transfer comment: Hands-on guarding required as pt powered-up to full standing position. Appeared slightly unsteady however was able to recover without therapist assist.   Ambulation/Gait Ambulation/Gait assistance: Min assist;Min guard Ambulation Distance (Feet): 200 Feet Assistive device: 1 person hand held assist;None Gait Pattern/deviations: Step-through pattern;Decreased stride length;Decreased weight shift to right Gait velocity: Decreased Gait velocity interpretation: Below normal speed for age/gender General Gait Details: Initially HHA provided with occasional assist required for balance support. By end of gait training pt was able to ambulate well without UE support. Noted decreased arm swing on the R with slight trunk rotation to the L and decreased weight shift to R.   Stairs Stairs: Yes Stairs assistance: Min guard Stair Management: Two rails;Step to pattern;Forwards Number of Stairs: 5 General stair comments: No assist required. Pt demonstrated good sequencing for safety. Hands-on guarding provided for safety.   Wheelchair Mobility    Modified Rankin (Stroke Patients Only) Modified Rankin (Stroke Patients Only) Pre-Morbid Rankin Score: Moderate disability Modified Rankin: Moderately severe disability     Balance Overall balance assessment: Needs assistance Sitting-balance support: Feet supported;No upper extremity supported Sitting balance-Leahy Scale: Fair   Postural control: Posterior lean Standing balance support: No upper extremity supported;During functional activity Standing balance-Leahy Scale: Fair               High level balance activites: Turns;Head turns High Level Balance Comments: Min guard required. No assist provided to complete during gait training.  Pertinent Vitals/Pain Pain Assessment: No/denies pain    Home Living Family/patient expects to be discharged to:: Private residence Living  Arrangements: Spouse/significant other;Children Available Help at Discharge: Family;Available 24 hours/day Type of Home: House Home Access: Stairs to enter Entrance Stairs-Rails: Right;Left;Can reach both Entrance Stairs-Number of Steps: 3 Home Layout: One level Home Equipment: Walker - 2 wheels;Cane - single point;Shower seat      Prior Function Level of Independence: Independent with assistive device(s)         Comments: Pt uses SPC for mobility, but reports he is independent with ADL's.      Hand Dominance   Dominant Hand: Right    Extremity/Trunk Assessment   Upper Extremity Assessment Upper Extremity Assessment: Defer to OT evaluation;RUE deficits/detail RUE Deficits / Details: Noted RUE weakness consistent with prior CVA. Pt reports the weakness is unchanged and at his "new baseline" RUE Sensation:  (Denies N/T)    Lower Extremity Assessment Lower Extremity Assessment: LLE deficits/detail LLE Deficits / Details: Mild weakness noted in quads/hip flexors however otherwise equal to R.  LLE Sensation:  (Denies N/T)    Cervical / Trunk Assessment Cervical / Trunk Assessment: Other exceptions Cervical / Trunk Exceptions: Forward head/rounded shoulders  Communication   Communication: Expressive difficulties  Cognition Arousal/Alertness: Awake/alert Behavior During Therapy: WFL for tasks assessed/performed Overall Cognitive Status: No family/caregiver present to determine baseline cognitive functioning                                        General Comments      Exercises     Assessment/Plan    PT Assessment Patient needs continued PT services  PT Problem List Decreased strength;Decreased activity tolerance;Decreased range of motion;Decreased balance;Decreased mobility;Decreased knowledge of use of DME;Decreased safety awareness;Decreased knowledge of precautions;Pain       PT Treatment Interventions Gait training;DME instruction;Stair  training;Functional mobility training;Therapeutic activities;Therapeutic exercise;Neuromuscular re-education;Patient/family education    PT Goals (Current goals can be found in the Care Plan section)  Acute Rehab PT Goals Patient Stated Goal: Home today PT Goal Formulation: With patient Time For Goal Achievement: 12/27/16 Potential to Achieve Goals: Good    Frequency Min 3X/week   Barriers to discharge        Co-evaluation               AM-PAC PT "6 Clicks" Daily Activity  Outcome Measure Difficulty turning over in bed (including adjusting bedclothes, sheets and blankets)?: None Difficulty moving from lying on back to sitting on the side of the bed? : A Little Difficulty sitting down on and standing up from a chair with arms (e.g., wheelchair, bedside commode, etc,.)?: A Little Help needed moving to and from a bed to chair (including a wheelchair)?: A Little Help needed walking in hospital room?: A Little Help needed climbing 3-5 steps with a railing? : A Little 6 Click Score: 19    End of Session Equipment Utilized During Treatment: Gait belt Activity Tolerance: Patient tolerated treatment well Patient left: in chair;with call bell/phone within reach;with chair alarm set Nurse Communication: Mobility status PT Visit Diagnosis: Unsteadiness on feet (R26.81);Other symptoms and signs involving the nervous system (R29.898)    Time: 6160-7371 PT Time Calculation (min) (ACUTE ONLY): 23 min   Charges:   PT Evaluation $PT Eval Moderate Complexity: 1 Mod PT Treatments $Gait Training: 8-22 mins   PT G Codes:  Rolinda Roan, PT, DPT Acute Rehabilitation Services Pager: [phone]   Thelma Comp 12/20/2016, 10:28 AM

## 2016-12-20 NOTE — Care Management Note (Signed)
Case Management Note  Patient Details  Name: Phillip Solis MRN: [id number] Date of Birth: [birth year]  Subjective/Objective:                    Action/Plan: Pt discharging home with family. CM consulted for outpatient rehab. CM met with the patient and family and they would like to use [facility]. CM called [facility] and faxed them the orders.  Family to provide transportation home.   Expected Discharge Date:  12/20/16               Expected Discharge Plan:  OP Rehab  In-House Referral:     Discharge planning Services  CM Consult  Post Acute Care Choice:    Choice offered to:     DME Arranged:    DME Agency:     HH Arranged:    HH Agency:     Status of Service:  Completed, signed off  If discussed at [employer] of Stay Meetings, dates discussed:    Additional Comments:  Pollie Friar, RN 12/20/2016, 4:38 PM

## 2016-12-20 NOTE — Discharge Summary (Signed)
Physician Discharge Summary  Phillip Solis ZOX:[id number] DOB: [birth year] DOA: 12/16/2016  PCP: Algis Greenhouse, MD  Admit date: 12/16/2016 Discharge date: 12/20/2016  Time spent: 35 minutes  Recommendations for Outpatient Follow-up:  1. Repeat CBC to follow Hgb trend  2. Repeat BMET to follow electrolytes and renal function  3. 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  Diet recommendation: heart healthy diet   Filed Weights   12/17/16 0901 12/18/16 0410 12/19/16 0441  Weight: 65 kg (143 lb 4.8 oz) 62.2 kg (137 lb 2 oz) 62 kg (136 lb 11 oz)    History of present illness:  71 y.o.malewith medical history significant of SDH, ischemia stroke, HLD, A fib on Eliquis (on hold in the past 2 weeks), chronic combined systolic and diastolic CHF with EF of 81-19%, who presents with unresponsiveness and chest pain. Per pt's wife, pt was LKW 1825. PT was at [employer] with wife when he became unresponsive. Pt also had left side facial droop, left arm weakness, and slurred speech. Patient with prior hx of stroke and SDH. Syncope believed associated with SDH and/or TIA. No other acute abnormalities seen on CT or MRI.  Hospital Course:  1-syncope: with concerns for seizure vs TIA vs associated with SDH -neurosurgery has seen patient and recommended no surgical intervention at this point. -will continue conservative management for now.  -outpatient  follow up in 10 days with Dr. Arnoldo Morale -continue holding anticoagulation and NSAID's -EEG neg for epilepsy; but per neurology rec's will continue Keppra at discharge -driving precautions discussed with patient  -patient has remained stable and in no distress or with any new focal deficit. -PT evaluated him and recommended outpatient physical therapy.  2-metabolic encephalopathy -essentially resolved and patient mentation back to baseline  -continue keppra as per neurology recommendations  -follow up with PCP and with neurosurgery   3-CP: intermittent and reproducible with certain left ar movements  -MSK in nature most likely  -continue PRN analgesics   4-elevated D-dimer: with CT showing presumed small PE vs artifact -patient unable to use anticoagulation due to acute SDH -patient is afebrile, currently no CP and no SOB. Also with neg DVT on LE duplex, no tachycardic and no tachypnea. -findings most likely artifact  5-hx of Stroke -continue statins -continue control of BP -no anticoagulation or antiplatelets due to SDH currently -no new neurologic changes appreciated at discharge   6-chronic systolic heart failure -compensated and stable -patient denies SOB and is having good O2 sat on RA -no LE edema, crackles or JVD appreciated. -instructed to follow low sodium diet and to check his weight on daily basis  -continue b-blocker   7-atrial fibrillation: -chronic -will continue metoprolol for rate control -continue holding anticoagulation    Procedures:  See below for x-ray reports   EEG: neg for acute epilepsy   LE duplex: no SVT, DVT's or baker's cyst.  Consultations:  Neurosurgery  Neurology   Discharge Exam: Vitals:   12/20/16  1038 12/20/16 1345  BP:  106/62  Pulse:  69  Resp:  18  Temp: 97.6 F (36.4 C) 98.1 F (36.7 C)  SpO2:  98%    General: AAOX3, no dysarthria, very minimal left facial droop, no HA's, no blurred vision and denies SOB and CP  currently. Patient excited to go home.   Cardiovascular: rate control, irregular, no rubs, no gallops, no JVD  Respiratory: good air movement, no wheezing, no crackles. Denies discomfort with deep breath.   Abdomen: soft, NT, ND, positive BS  Musculoskeletal: no edema, no cyanosis, no clubbing. SCD's in place.   Discharge Instructions   Discharge Instructions    Ambulatory referral to Physical Therapy    Complete by:  As directed    Diet - low sodium heart healthy    Complete by:  As directed    Discharge instructions    Complete by:  As directed    KEEP YOURSELF WELL HYDRATED TAKE MEDICATIONS AS PRESCRIBED AVOID USE OF NSAID'S (ASPIRIN, IBUPROFEN, MOTRIN, NAPROXEN, ALEVE, GOODY POWDERS, ETC..) [place] HEALTHY DIET  NO DRIVING UNTIL FOLLOW UP WITH NEUROSURGERY     Current Discharge Medication List    START taking these medications   Details  levETIRAcetam (KEPPRA) 500 MG tablet Take 1 tablet (500 mg total) by mouth 2 (two) times daily. Qty: 60 tablet, Refills: 0      CONTINUE these medications which have CHANGED   Details  apixaban (ELIQUIS) 5 MG TABS tablet Take 1 tablet (5 mg total) by mouth 2 (two) times daily. PLEASE CONTINUE HOLDING MEDICATION FOR NOW    diphenhydrAMINE (BENADRYL) 25 MG tablet Take 1 tablet (25 mg total) by mouth every 8 (eight) hours as needed (allergic reactions).      CONTINUE these medications which have NOT CHANGED   Details  metoprolol succinate (TOPROL-XL) 25 MG 24 hr tablet Take 1 tablet (25 mg total) by mouth daily. Qty: 90 tablet, Refills: 1    Multiple Vitamin (MULTIVITAMIN WITH MINERALS) TABS tablet Take 1 tablet by mouth daily.    atorvastatin (LIPITOR) 20 MG tablet Take 1 tablet (20 mg total) by mouth daily at 6 PM. Qty: 90 tablet, Refills: 1       Allergies  Allergen Reactions  . No Known Allergies    Follow-up Information    Dough, Jaymes Graff, MD. Schedule an  appointment as soon as possible for a visit in 2 week(s).   Specialty:  Family Medicine Contact information: [address] [place] [address] [phone]        Newman Pies, MD. Schedule an appointment as soon as possible for a visit in 10 day(s).   Specialty:  Neurosurgery Contact information: [address] [place] [address] [phone]           The results of significant diagnostics from this hospitalization (including imaging, microbiology, ancillary and laboratory) are listed below for reference.    Significant Diagnostic Studies: Ct Head Wo Contrast  Result Date: 12/15/2016 CLINICAL DATA:  Bilateral subdural hematoma EXAM: CT HEAD WITHOUT CONTRAST TECHNIQUE: Contiguous axial images were obtained from the base of the skull through the vertex without intravenous contrast. COMPARISON:  CT head 12/03/2016 FINDINGS: Brain: Right-sided subdural fluid collection slightly larger, now measuring 12 mm in thickness, compared with 11 mm previously. No new hemorrhage. Mixed density subdural fluid on the right predominately low-density. Decreasing high-density component compared with the prior study. Left-sided subdural mixed density subdural hematoma  8 mm in thickness, unchanged. No new bleeding on the left. 6 mm midline shift to the left slightly increased from the prior study when it measured 4.4 mm. Negative for hydrocephalus. No acute infarct. Chronic left MCA infarct. Vascular: Negative for hyperdense vessel Skull: Well-defined lucencies in the skull apex bilaterally appear benign and are chronic. Sinuses/Orbits: Negative Other: None IMPRESSION: Chronic right-sided subdural hematoma slightly larger. No new bleeding. Midline shift slightly increased, now 6 mm shift to the left Left-sided mixed density subdural hematoma unchanged without evidence of new bleeding. Electronically Signed   By: Franchot Gallo M.D.   On: 12/15/2016 11:20   Ct Head Wo  Contrast  Result Date: 12/03/2016 CLINICAL DATA:  Followup subdural hematoma. EXAM: CT HEAD WITHOUT CONTRAST TECHNIQUE: Contiguous axial images were obtained from the base of the skull through the vertex without intravenous contrast. COMPARISON:  11/08/2016.  11/01/2016.  10/28/2016. FINDINGS: Brain: Left convexity subdural hematoma is becoming less dense in getting smaller. Index diameter is 11 mm as opposed to 13 mm on the previous study. Right convexity subdural hematoma is persistent, again measuring 12-13 mm in thickness. This continues to show a higher density with a few small areas of high density that could represent some ongoing low level bleeding. Right-to-left shift has increased from 3 mm to 4.5 mm. Small resolving left frontal white matter contusion remains visible. Ventricles are larger, consistent with less mass effect. No actual hydrocephalus. Previous old infarctions in the left insula and lateral temporal lobe are more evident given the diminishing mass effect. Vascular: There is atherosclerotic calcification of the major vessels at the base of the brain. Skull: No skull fracture. Sinuses/Orbits: Clear/normal Other: None significant IMPRESSION: Left convexity subdural is showing diminishing density and decreased thickness. Right convexity subdural continues to be of higher density, is not getting smaller, and may contain some areas of higher density consistent with ongoing bleeding. Slightly increased right to left shift from 3-4.5 mm. Overall less mass effect with less ventricular compression. Electronically Signed   By: Nelson Chimes M.D.   On: 12/03/2016 15:21   Ct Angio Chest Pe W Or Wo Contrast  Result Date: 12/17/2016 CLINICAL DATA:  LEFT chest pain beginning at 5 p.m. EXAM: CT ANGIOGRAPHY CHEST WITH CONTRAST TECHNIQUE: Multidetector CT imaging of the chest was performed using the standard protocol during bolus administration of intravenous contrast. Multiplanar CT image reconstructions  and MIPs were obtained to evaluate the vascular anatomy. CONTRAST:  75 cc Isovue 370 COMPARISON:  Chest radiograph December 16, 2016 and chest radiograph July 14, 2015 FINDINGS: CARDIOVASCULAR: Adequate contrast opacification of the pulmonary artery's. Main pulmonary artery is not enlarged. Nonocclusive pulmonary embolus LEFT upper lobe segmental the subsegmental branches seen on axial 123/284 though, not apparent on reformations, possible artifact. Heart size is mildly enlarged. No RIGHT heart strain (RV/LV .7). No pericardial effusions. Thoracic aorta is normal in course and caliber with mild calcific atherosclerosis MEDIASTINUM/NODES: No lymphadenopathy by CT size criteria. LUNGS/PLEURA: Tracheobronchial tree is patent, no pneumothorax. Mild bronchial wall thickening. Bilateral lower lobe atelectasis without focal consolidation. No pleural effusion. UPPER ABDOMEN: Included view of the abdomen is unremarkable. MUSCULOSKELETAL: Moderate to severe T6 compression fracture with 50-75% height loss, new from 2017 and, possibly acute. Review of the MIP images confirms the above findings. IMPRESSION: 1. Tiny nonocclusive LEFT upper lobe segmental to subsegmental pulmonary embolus versus artifact. No RIGHT heart strain. 2. Moderate to severe T6 compression fracture, this may be acute. Recommend correlation with point tenderness. 3. Bronchial wall  thickening seen with bronchitis or reactive airway disease. No pneumonia. Bilateral lower lobe atelectasis. Aortic Atherosclerosis (ICD10-I70.0). Electronically Signed   By: Elon Alas M.D.   On: 12/17/2016 02:01   Dg Chest Port 1 View  Result Date: 12/16/2016 CLINICAL DATA:  Confusion.  Altered mental status. EXAM: PORTABLE CHEST 1 VIEW COMPARISON:  07/14/2015 FINDINGS: 2004 hours. The cardio pericardial silhouette is enlarged. There is pulmonary vascular congestion without overt pulmonary edema. Interstitial markings are diffusely coarsened with chronic features. The  visualized bony structures of the thorax are intact. Chronic atelectasis or scarring at the left base. IMPRESSION: Borderline cardiomegaly with chronic interstitial coarsening and probable atelectasis or scarring at the left base. Electronically Signed   By: Misty Stanley M.D.   On: 12/16/2016 20:21   Ct Head Code Stroke Wo Contrast  Addendum Date: 12/16/2016   ADDENDUM REPORT: 12/16/2016 23:55 ADDENDUM: Findings were discussed with Dr. Rory Percy at approximately 7:40 p.m. on 12/16/2016. Please note that communication was somewhat delayed due to technical difficulties (inaccurate pager number provided as contact)). Electronically Signed   By: Jeannine Boga M.D.   On: 12/16/2016 23:55   Result Date: 12/16/2016 CLINICAL DATA:  Code stroke. Initial evaluation for acute slurred speech, left-sided weakness. EXAM: CT HEAD WITHOUT CONTRAST TECHNIQUE: Contiguous axial images were obtained from the base of the skull through the vertex without intravenous contrast. COMPARISON:  Prior CT from 12/15/2016. FINDINGS: Brain: Acute on chronic right holo hemispheric subdural hematoma again seen, overall little interval changed in size in appearance, measuring up to 15 mm in maximal thickness. Acute on chronic left subdural hematoma also relatively stable measuring up to 7-8 mm. No evidence for interval bleeding. Persistent 5 mm right-to-left shift, similar. No hydrocephalus or ventricular trapping. Basilar cisterns are patent. Few scattered foci of periventricular hemorrhage grossly stable. Small volume hemorrhage along the posterior corpus callosum also unchanged. No other new acute intracranial hemorrhage. No acute large vessel territory infarct. Encephalomalacia out within the left temporal lobe related to remote left MCA territory infarct, unchanged. No mass lesion. Vascular: No hyperdense vessel. Skull: Scalp soft tissues and calvarium unchanged. Sinuses/Orbits: Globes and orbital soft tissues within normal limits.  Paranasal sinuses and mastoids are clear. ASPECTS [facility] Stroke Program Early CT Score) - Ganglionic level infarction (caudate, lentiform nuclei, internal capsule, insula, M1-M3 cortex): 7 - Supraganglionic infarction (M4-M6 cortex): 3 Total score (0-10 with 10 being normal): 10 IMPRESSION: 1. No acute large vessel territory infarct identified. 2. ASPECTS is 10 3. Acute on chronic bilateral subdural hematomas, right larger than left, overall little interval change. Similar 5 mm right-to-left midline shift. 4. Additional few scattered foci of subcentimeter hemorrhage along the corpus callosum and periventricular white matter, also unchanged. 5. Remote left temporal lobe infarct. Dr. Rory Percy of the Stroke Neurology service was paged regarding these results at 7:28 p.m. on 12/16/2016. Currently awaiting a call back. Electronically Signed: By: Jeannine Boga M.D. On: 12/16/2016 19:30    Microbiology: Recent Results (from the past 240 hour(s))  MRSA PCR Screening     Status: None   Collection Time: 12/17/16  9:07 AM  Result Value Ref Range Status   MRSA by PCR NEGATIVE NEGATIVE Final    Comment:        The GeneXpert MRSA Assay (FDA approved for NASAL specimens only), is one component of a comprehensive MRSA colonization surveillance program. It is not intended to diagnose MRSA infection nor to guide or monitor treatment for MRSA infections.      Labs: Basic  Metabolic Panel:  Recent Labs Lab 12/16/16 1903 12/16/16 1907 12/17/16 0327  NA 138 138 137  K 4.5 4.5 4.6  CL 106 106 105  CO2 13*  --  26  GLUCOSE 160* 152* 99  BUN 15 15 11   CREATININE 1.46* 1.10 0.92  CALCIUM 8.9  --  8.4*   Liver Function Tests:  Recent Labs Lab 12/16/16 1903  AST 35  ALT <5*  ALKPHOS 67  BILITOT 0.8  PROT 6.4*  ALBUMIN 3.5   CBC:  Recent Labs Lab 12/16/16 1903 12/16/16 1907 12/17/16 0327  WBC 7.7  --  6.6  NEUTROABS 3.9  --   --   HGB 12.8* 13.3 12.0*  HCT 39.4 39.0 35.6*  MCV  91.2  --  89.7  PLT 216  --  195   Cardiac Enzymes:  Recent Labs Lab 12/16/16 2217 12/17/16 0327 12/17/16 1000  TROPONINI <0.03 <0.03 <0.03   BNP: BNP (last 3 results)  Recent Labs  12/16/16 2217  BNP 118.9*   CBG:  Recent Labs Lab 12/17/16 1223 12/17/16 1558 12/18/16 0737 12/19/16 0726 12/20/16 0756  GLUCAP 75 81 68 88 104*    Signed:  Barton Dubois MD.  [facility] 12/20/2016, 4:33 PM

## 2016-12-22 NOTE — Progress Notes (Addendum)
Anesthesia Chart Review:  Pt is a same day work up.   Pt is a 71 year old male scheduled for burr holes for evacuation of subdural hematoma on 12/23/2016 with Newman Pies, M.D.  - Cardiologist is Lyman Bishop, MD  PMH includes: atrial fibrillation, ischemic stroke (07/2015). Former smoker. BMI 23.5  - Hospitalized 8/9-13/18 for syncope vs TIA vs LOC due to subdural hematoma. EEG negative but neurology recommends continuing keppra. Complicated by afib with RVR (long-standing afib, converted spontaneously), chronic CHF, acute kidney injury, encephalopathy, elevated d-dimer with CT showing small PE vs artifact  - Hospitalized 6/21-25/18 for B subdural hematomas with therapeutic coumadin level.   Medications include: Eliquis, Lipitor, Keppra, metoprolol.  Eliquis on hold for over 2 weeks.   Labs from hospitalization 12/17/16 reviewed.  CBC and BMET acceptable for surgery.   CT angio chest 12/17/16:  1. Tiny nonocclusive LEFT upper lobe segmental to subsegmental pulmonary embolus versus artifact. No RIGHT heart strain. 2. Moderate to severe T6 compression fracture, this may be acute. Recommend correlation with point tenderness. 3. Bronchial wall thickening seen with bronchitis or reactive airway disease. No pneumonia. Bilateral lower lobe atelectasis.  1 view CXR 12/16/16: Borderline cardiomegaly with chronic interstitial coarsening and probable atelectasis or scarring at the left base.  EKG 12/17/16: Atrial fibrillation with RVR. Low voltage QRS. Incomplete LBBB. Inferior infarct, age undetermined. Cannot rule out Anterior infarct, age undetermined  Carotid duplex 06/21/16: Negative for hemodynamically significant stenosis involving extracranial carotid arteries bilaterally.  Echo 07/13/15:  - Left ventricle: The cavity size was normal. Systolic function wasmildly reduced. The estimated ejection fraction was in the range  of 45% to 50%. There is akinesis of the basal-midinferolateral myocardium.  Doppler parameters are consistent with abnormal left ventricular relaxation (grade 1 diastolic dysfunction). - Aortic valve: There was trivial regurgitation. - Mitral valve: There was mild regurgitation. - Impressions: EF improved from prior. No cardiac source of emboli was indentified.  Cardiac cath 07/24/14:  1.  Angiographically normal coronary arteries 2.  Severe global hypokinesis, LVEF 35-40% 3.  Compensated right and left heart filling pressures 4.  Moderately decreased cardiac output  If no changes, I anticipate pt can proceed with surgery as scheduled.   Willeen Cass, FNP-BC [facility]/Anesthesiology Phone: [phone] 12/22/2016 12:21 PM

## 2016-12-23 ENCOUNTER — Inpatient Hospital Stay (HOSPITAL_COMMUNITY): Payer: Medicare HMO | Admitting: Emergency Medicine

## 2016-12-23 ENCOUNTER — Inpatient Hospital Stay (HOSPITAL_COMMUNITY)
Admission: RE | Admit: 2016-12-23 | Discharge: 2016-12-24 | DRG: 026 | Disposition: A | Discharge status: Home / Self Care | Payer: Medicare HMO | Source: Ambulatory Visit | Attending: Neurosurgery | Admitting: Neurosurgery

## 2016-12-23 ENCOUNTER — Encounter (HOSPITAL_COMMUNITY)
Admission: RE | Disposition: A | Discharge status: Home / Self Care | Payer: Self-pay | Source: Ambulatory Visit | Attending: Neurosurgery

## 2016-12-23 ENCOUNTER — Encounter (HOSPITAL_COMMUNITY): Payer: Self-pay | Admitting: Certified Registered Nurse Anesthetist

## 2016-12-23 DIAGNOSIS — Z823 Family history of stroke: Secondary | ICD-10-CM | POA: Diagnosis not present

## 2016-12-23 DIAGNOSIS — E440 Moderate protein-calorie malnutrition: Secondary | ICD-10-CM | POA: Diagnosis present

## 2016-12-23 DIAGNOSIS — S065XAA Traumatic subdural hemorrhage with loss of consciousness status unknown, initial encounter: Secondary | ICD-10-CM | POA: Diagnosis present

## 2016-12-23 DIAGNOSIS — R569 Unspecified convulsions: Secondary | ICD-10-CM | POA: Diagnosis present

## 2016-12-23 DIAGNOSIS — I4891 Unspecified atrial fibrillation: Secondary | ICD-10-CM | POA: Diagnosis present

## 2016-12-23 DIAGNOSIS — Z79899 Other long term (current) drug therapy: Secondary | ICD-10-CM | POA: Diagnosis not present

## 2016-12-23 DIAGNOSIS — Z6823 Body mass index (BMI) 23.0-23.9, adult: Secondary | ICD-10-CM

## 2016-12-23 DIAGNOSIS — I509 Heart failure, unspecified: Secondary | ICD-10-CM | POA: Diagnosis present

## 2016-12-23 DIAGNOSIS — W1830XA Fall on same level, unspecified, initial encounter: Secondary | ICD-10-CM | POA: Diagnosis present

## 2016-12-23 DIAGNOSIS — S065X9A Traumatic subdural hemorrhage with loss of consciousness of unspecified duration, initial encounter: Principal | ICD-10-CM | POA: Diagnosis present

## 2016-12-23 DIAGNOSIS — Z8673 Personal history of transient ischemic attack (TIA), and cerebral infarction without residual deficits: Secondary | ICD-10-CM | POA: Diagnosis not present

## 2016-12-23 DIAGNOSIS — Z7901 Long term (current) use of anticoagulants: Secondary | ICD-10-CM | POA: Diagnosis not present

## 2016-12-23 HISTORY — PX: BURR HOLE: SHX908

## 2016-12-23 LAB — PROTIME-INR
INR: 0.93
PROTHROMBIN TIME: 12.5 s (ref 11.4–15.2)

## 2016-12-23 SURGERY — CREATION, CRANIAL BURR HOLE
Anesthesia: General | Site: Head | Laterality: Right

## 2016-12-23 MED ORDER — PANTOPRAZOLE SODIUM 40 MG IV SOLR
40.0000 mg | Freq: Every day | INTRAVENOUS | Status: DC
  Administered 2016-12-23: 40 mg via INTRAVENOUS
  Filled 2016-12-23: qty 40

## 2016-12-23 MED ORDER — 0.9 % SODIUM CHLORIDE (POUR BTL) OPTIME
TOPICAL | Status: DC | PRN
  Administered 2016-12-23 (×3): 1000 mL

## 2016-12-23 MED ORDER — THROMBIN 20000 UNITS EX SOLR
CUTANEOUS | Status: DC | PRN
  Administered 2016-12-23: 20 mL via TOPICAL

## 2016-12-23 MED ORDER — THROMBIN 5000 UNITS EX SOLR
OROMUCOSAL | Status: DC | PRN
  Administered 2016-12-23: 5 mL via TOPICAL

## 2016-12-23 MED ORDER — SUGAMMADEX SODIUM 200 MG/2ML IV SOLN
INTRAVENOUS | Status: AC
  Filled 2016-12-23: qty 2

## 2016-12-23 MED ORDER — LACTATED RINGERS IV SOLN
INTRAVENOUS | Status: DC
  Administered 2016-12-23: 13:00:00 via INTRAVENOUS

## 2016-12-23 MED ORDER — PROMETHAZINE HCL 25 MG PO TABS: 12.5000 mg | ORAL_TABLET | ORAL | Status: DC | PRN

## 2016-12-23 MED ORDER — BACITRACIN ZINC 500 UNIT/GM EX OINT
TOPICAL_OINTMENT | CUTANEOUS | Status: AC
  Filled 2016-12-23: qty 28.35

## 2016-12-23 MED ORDER — FENTANYL CITRATE (PF) 250 MCG/5ML IJ SOLN
INTRAMUSCULAR | Status: AC
  Filled 2016-12-23: qty 5

## 2016-12-23 MED ORDER — FENTANYL CITRATE (PF) 100 MCG/2ML IJ SOLN: 25.0000 ug | INTRAMUSCULAR | Status: DC | PRN

## 2016-12-23 MED ORDER — DIPHENHYDRAMINE HCL 25 MG PO CAPS
25.0000 mg | ORAL_CAPSULE | Freq: Three times a day (TID) | ORAL | Status: DC | PRN
  Filled 2016-12-23: qty 1

## 2016-12-23 MED ORDER — HYDROCODONE-ACETAMINOPHEN 5-325 MG PO TABS
1.0000 | ORAL_TABLET | ORAL | Status: DC | PRN
  Administered 2016-12-23: 2 via ORAL
  Administered 2016-12-23: 1 via ORAL
  Administered 2016-12-24: 2 via ORAL
  Filled 2016-12-23: qty 2
  Filled 2016-12-23: qty 1
  Filled 2016-12-23: qty 2

## 2016-12-23 MED ORDER — BUPIVACAINE-EPINEPHRINE (PF) 0.5% -1:200000 IJ SOLN
INTRAMUSCULAR | Status: AC
  Filled 2016-12-23: qty 30

## 2016-12-23 MED ORDER — PNEUMOCOCCAL VAC POLYVALENT 25 MCG/0.5ML IJ INJ: 0.5000 mL | INJECTION | INTRAMUSCULAR | Status: DC

## 2016-12-23 MED ORDER — ONDANSETRON HCL 4 MG/2ML IJ SOLN
INTRAMUSCULAR | Status: DC | PRN
  Administered 2016-12-23: 4 mg via INTRAVENOUS

## 2016-12-23 MED ORDER — CHLORHEXIDINE GLUCONATE CLOTH 2 % EX PADS: 6.0000 | MEDICATED_PAD | Freq: Once | CUTANEOUS | Status: DC

## 2016-12-23 MED ORDER — DOCUSATE SODIUM 100 MG PO CAPS
100.0000 mg | ORAL_CAPSULE | Freq: Two times a day (BID) | ORAL | Status: DC
  Administered 2016-12-23 – 2016-12-24 (×2): 100 mg via ORAL
  Filled 2016-12-23 (×2): qty 1

## 2016-12-23 MED ORDER — CEFAZOLIN SODIUM-DEXTROSE 2-4 GM/100ML-% IV SOLN
2.0000 g | Freq: Three times a day (TID) | INTRAVENOUS | Status: AC
  Administered 2016-12-23 – 2016-12-24 (×2): 2 g via INTRAVENOUS
  Filled 2016-12-23 (×2): qty 100

## 2016-12-23 MED ORDER — METOPROLOL SUCCINATE ER 25 MG PO TB24
25.0000 mg | ORAL_TABLET | Freq: Every day | ORAL | Status: DC
  Administered 2016-12-23 – 2016-12-24 (×2): 25 mg via ORAL
  Filled 2016-12-23 (×2): qty 1

## 2016-12-23 MED ORDER — CEFAZOLIN SODIUM-DEXTROSE 2-4 GM/100ML-% IV SOLN
INTRAVENOUS | Status: AC
  Filled 2016-12-23: qty 100

## 2016-12-23 MED ORDER — ONDANSETRON HCL 4 MG/2ML IJ SOLN: 4.0000 mg | INTRAMUSCULAR | Status: DC | PRN

## 2016-12-23 MED ORDER — PROPOFOL 10 MG/ML IV BOLUS
INTRAVENOUS | Status: AC
  Filled 2016-12-23: qty 40

## 2016-12-23 MED ORDER — ENSURE ENLIVE PO LIQD
237.0000 mL | Freq: Two times a day (BID) | ORAL | Status: DC
  Administered 2016-12-23 – 2016-12-24 (×2): 237 mL via ORAL

## 2016-12-23 MED ORDER — THROMBIN 20000 UNITS EX SOLR
CUTANEOUS | Status: AC
  Filled 2016-12-23: qty 20000

## 2016-12-23 MED ORDER — CEFAZOLIN SODIUM-DEXTROSE 2-4 GM/100ML-% IV SOLN
2.0000 g | INTRAVENOUS | Status: AC
  Administered 2016-12-23: 2 g via INTRAVENOUS

## 2016-12-23 MED ORDER — ROCURONIUM BROMIDE 10 MG/ML (PF) SYRINGE
PREFILLED_SYRINGE | INTRAVENOUS | Status: DC | PRN
  Administered 2016-12-23: 40 mg via INTRAVENOUS
  Administered 2016-12-23: 20 mg via INTRAVENOUS

## 2016-12-23 MED ORDER — SODIUM CHLORIDE 0.9 % IR SOLN
Status: DC | PRN
  Administered 2016-12-23: 500 mL

## 2016-12-23 MED ORDER — BACITRACIN ZINC 500 UNIT/GM EX OINT
TOPICAL_OINTMENT | CUTANEOUS | Status: DC | PRN
  Administered 2016-12-23: 1 via TOPICAL

## 2016-12-23 MED ORDER — LACTATED RINGERS IV SOLN
INTRAVENOUS | Status: DC | PRN
  Administered 2016-12-23 (×2): via INTRAVENOUS

## 2016-12-23 MED ORDER — FENTANYL CITRATE (PF) 100 MCG/2ML IJ SOLN
INTRAMUSCULAR | Status: DC | PRN
  Administered 2016-12-23 (×2): 50 ug via INTRAVENOUS
  Administered 2016-12-23: 25 ug via INTRAVENOUS

## 2016-12-23 MED ORDER — ONDANSETRON HCL 4 MG PO TABS: 4.0000 mg | ORAL_TABLET | ORAL | Status: DC | PRN

## 2016-12-23 MED ORDER — LABETALOL HCL 5 MG/ML IV SOLN: 10.0000 mg | INTRAVENOUS | Status: DC | PRN

## 2016-12-23 MED ORDER — THROMBIN 5000 UNITS EX SOLR
CUTANEOUS | Status: AC
  Filled 2016-12-23: qty 5000

## 2016-12-23 MED ORDER — ATORVASTATIN CALCIUM 20 MG PO TABS
20.0000 mg | ORAL_TABLET | Freq: Every day | ORAL | Status: DC
  Administered 2016-12-23 – 2016-12-24 (×2): 20 mg via ORAL
  Filled 2016-12-23 (×2): qty 1

## 2016-12-23 MED ORDER — LEVETIRACETAM 500 MG PO TABS
500.0000 mg | ORAL_TABLET | Freq: Two times a day (BID) | ORAL | Status: DC
  Administered 2016-12-23 – 2016-12-24 (×2): 500 mg via ORAL
  Filled 2016-12-23 (×2): qty 1

## 2016-12-23 MED ORDER — ACETAMINOPHEN 650 MG RE SUPP: 650.0000 mg | RECTAL | Status: DC | PRN

## 2016-12-23 MED ORDER — ADULT MULTIVITAMIN W/MINERALS CH
1.0000 | ORAL_TABLET | Freq: Every day | ORAL | Status: DC
  Administered 2016-12-23 – 2016-12-24 (×2): 1 via ORAL
  Filled 2016-12-23 (×2): qty 1

## 2016-12-23 MED ORDER — SUGAMMADEX SODIUM 200 MG/2ML IV SOLN
INTRAVENOUS | Status: DC | PRN
  Administered 2016-12-23: 123.4 mg via INTRAVENOUS

## 2016-12-23 MED ORDER — PROPOFOL 10 MG/ML IV BOLUS
INTRAVENOUS | Status: DC | PRN
  Administered 2016-12-23: 40 mg via INTRAVENOUS
  Administered 2016-12-23: 20 mg via INTRAVENOUS
  Administered 2016-12-23: 130 mg via INTRAVENOUS

## 2016-12-23 MED ORDER — MORPHINE SULFATE (PF) 4 MG/ML IV SOLN: 2.0000 mg | INTRAVENOUS | Status: DC | PRN

## 2016-12-23 MED ORDER — ACETAMINOPHEN 325 MG PO TABS
650.0000 mg | ORAL_TABLET | ORAL | Status: DC | PRN
Start: 2016-12-23 — End: 2016-12-24

## 2016-12-23 MED ORDER — LIDOCAINE 2% (20 MG/ML) 5 ML SYRINGE
INTRAMUSCULAR | Status: DC | PRN
  Administered 2016-12-23: 60 mg via INTRAVENOUS

## 2016-12-23 MED ORDER — POTASSIUM CHLORIDE IN NACL 20-0.9 MEQ/L-% IV SOLN
INTRAVENOUS | Status: DC
  Administered 2016-12-23 – 2016-12-24 (×2): via INTRAVENOUS
  Filled 2016-12-23 (×2): qty 1000

## 2016-12-23 MED ORDER — BUPIVACAINE-EPINEPHRINE 0.5% -1:200000 IJ SOLN
INTRAMUSCULAR | Status: DC | PRN
  Administered 2016-12-23: 4 mL
  Administered 2016-12-23: 10 mL

## 2016-12-23 SURGICAL SUPPLY — 68 items
BAG DECANTER FOR FLEXI CONT (MISCELLANEOUS) ×3 IMPLANT
BIT DRILL WIRE PASS 1.3MM (BIT) IMPLANT
BLADE CLIPPER SPEC (BLADE) ×9 IMPLANT
BUR ACORN 6.0 PRECISION (BURR) ×2 IMPLANT
BUR ACORN 6.0MM PRECISION (BURR) ×1
BUR PRECISION FLUTE 6.0 (BURR) ×3 IMPLANT
BUR SPIRAL ROUTER 2.3 (BUR) ×2 IMPLANT
BUR SPIRAL ROUTER 2.3MM (BUR) ×1
CANISTER SUCT 3000ML PPV (MISCELLANEOUS) ×3 IMPLANT
CARTRIDGE OIL MAESTRO DRILL (MISCELLANEOUS) ×1 IMPLANT
CLIP VESOCCLUDE MED 6/CT (CLIP) IMPLANT
COVER BACK TABLE 60X90IN (DRAPES) IMPLANT
DIFFUSER DRILL AIR PNEUMATIC (MISCELLANEOUS) ×3 IMPLANT
DRAPE NEUROLOGICAL W/INCISE (DRAPES) ×3 IMPLANT
DRAPE SURG 17X23 STRL (DRAPES) ×3 IMPLANT
DRAPE WARM FLUID 44X44 (DRAPE) ×3 IMPLANT
DRILL WIRE PASS 1.3MM (BIT)
ELECT CAUTERY BLADE 6.4 (BLADE) IMPLANT
ELECT REM PT RETURN 9FT ADLT (ELECTROSURGICAL) ×3
ELECTRODE REM PT RTRN 9FT ADLT (ELECTROSURGICAL) ×1 IMPLANT
EVACUATOR 1/8 PVC DRAIN (DRAIN) IMPLANT
EVACUATOR SILICONE 100CC (DRAIN) IMPLANT
GAUZE SPONGE 4X4 12PLY STRL (GAUZE/BANDAGES/DRESSINGS) ×3 IMPLANT
GAUZE SPONGE 4X4 16PLY XRAY LF (GAUZE/BANDAGES/DRESSINGS) IMPLANT
GLOVE BIO SURGEON STRL SZ8 (GLOVE) ×3 IMPLANT
GLOVE BIO SURGEON STRL SZ8.5 (GLOVE) ×3 IMPLANT
GLOVE BIOGEL PI IND STRL 7.0 (GLOVE) ×1 IMPLANT
GLOVE BIOGEL PI IND STRL 8 (GLOVE) ×2 IMPLANT
GLOVE BIOGEL PI INDICATOR 7.0 (GLOVE) ×2
GLOVE BIOGEL PI INDICATOR 8 (GLOVE) ×4
GLOVE ECLIPSE 7.5 STRL STRAW (GLOVE) ×6 IMPLANT
GLOVE EXAM NITRILE LRG STRL (GLOVE) IMPLANT
GLOVE EXAM NITRILE XL STR (GLOVE) IMPLANT
GLOVE EXAM NITRILE XS STR PU (GLOVE) IMPLANT
GOWN STRL REUS W/ TWL LRG LVL3 (GOWN DISPOSABLE) IMPLANT
GOWN STRL REUS W/ TWL XL LVL3 (GOWN DISPOSABLE) ×1 IMPLANT
GOWN STRL REUS W/TWL LRG LVL3 (GOWN DISPOSABLE)
GOWN STRL REUS W/TWL XL LVL3 (GOWN DISPOSABLE) ×2
HEMOSTAT POWDER SURGIFOAM 1G (HEMOSTASIS) ×3 IMPLANT
KIT BASIN OR (CUSTOM PROCEDURE TRAY) ×3 IMPLANT
KIT ROOM TURNOVER OR (KITS) ×3 IMPLANT
MARKER SKIN DUAL TIP RULER LAB (MISCELLANEOUS) IMPLANT
NEEDLE HYPO 22GX1.5 SAFETY (NEEDLE) ×3 IMPLANT
NS IRRIG 1000ML POUR BTL (IV SOLUTION) ×9 IMPLANT
OIL CARTRIDGE MAESTRO DRILL (MISCELLANEOUS) ×3
PACK CRANIOTOMY (CUSTOM PROCEDURE TRAY) ×3 IMPLANT
PAD ARMBOARD 7.5X6 YLW CONV (MISCELLANEOUS) ×9 IMPLANT
PATTIES SURGICAL .25X.25 (GAUZE/BANDAGES/DRESSINGS) IMPLANT
PATTIES SURGICAL .5 X.5 (GAUZE/BANDAGES/DRESSINGS) IMPLANT
PATTIES SURGICAL .5 X3 (DISPOSABLE) IMPLANT
PATTIES SURGICAL 1X1 (DISPOSABLE) IMPLANT
PIN MAYFIELD SKULL DISP (PIN) ×3 IMPLANT
RUBBERBAND STERILE (MISCELLANEOUS) IMPLANT
SPONGE NEURO XRAY DETECT 1X3 (DISPOSABLE) IMPLANT
STAPLER SKIN PROX WIDE 3.9 (STAPLE) ×3 IMPLANT
SUT ETHILON 3 0 FSL (SUTURE) IMPLANT
SUT NURALON 4 0 TR CR/8 (SUTURE) ×6 IMPLANT
SUT PROLENE 6 0 BV (SUTURE) IMPLANT
SUT VIC AB 2-0 CP2 18 (SUTURE) ×3 IMPLANT
SUT VIC AB 3-0 FS2 27 (SUTURE) ×3 IMPLANT
SUT VICRYL 4-0 PS2 18IN ABS (SUTURE) IMPLANT
SYR CONTROL 10ML LL (SYRINGE) IMPLANT
TAPE CLOTH SURG 4X10 WHT LF (GAUZE/BANDAGES/DRESSINGS) ×3 IMPLANT
TOWEL GREEN STERILE (TOWEL DISPOSABLE) ×3 IMPLANT
TOWEL GREEN STERILE FF (TOWEL DISPOSABLE) ×3 IMPLANT
TRAY FOLEY W/METER SILVER 16FR (SET/KITS/TRAYS/PACK) IMPLANT
UNDERPAD 30X30 (UNDERPADS AND DIAPERS) IMPLANT
WATER STERILE IRR 1000ML POUR (IV SOLUTION) ×3 IMPLANT

## 2016-12-23 NOTE — Anesthesia Preprocedure Evaluation (Addendum)
Anesthesia Evaluation  Patient identified by MRN, date of birth, ID band Patient awake    Reviewed: Allergy & Precautions, H&P , NPO status , Patient's Chart, lab work & pertinent test results, reviewed documented beta blocker date and time   Airway Mallampati: III  TM Distance: >3 FB Neck ROM: Full    Dental no notable dental hx. (+) Upper Dentures, Edentulous Lower, Dental Advisory Given   Pulmonary neg pulmonary ROS, former smoker,    Pulmonary exam normal breath sounds clear to auscultation       Cardiovascular Pt. on home beta blockers + dysrhythmias Atrial Fibrillation  Rhythm:Irregular Rate:Tachycardia     Neuro/Psych Subdural hematoma negative psych ROS   GI/Hepatic negative GI ROS, Neg liver ROS,   Endo/Other  negative endocrine ROS  Renal/GU negative Renal ROS  negative genitourinary   Musculoskeletal   Abdominal   Peds  Hematology negative hematology ROS (+) anemia ,   Anesthesia Other Findings   Reproductive/Obstetrics negative OB ROS                            Anesthesia Physical Anesthesia Plan  ASA: III  Anesthesia Plan: General   Post-op Pain Management:    Induction: Intravenous  PONV Risk Score and Plan: 3 and Ondansetron, Dexamethasone and Treatment may vary due to age or medical condition  Airway Management Planned: Oral ETT  Additional Equipment:   Intra-op Plan:   Post-operative Plan: Extubation in OR  Informed Consent: I have reviewed the patients History and Physical, chart, labs and discussed the procedure including the risks, benefits and alternatives for the proposed anesthesia with the patient or authorized representative who has indicated his/her understanding and acceptance.   Dental advisory given  Plan Discussed with: CRNA  Anesthesia Plan Comments:        Anesthesia Quick Evaluation

## 2016-12-23 NOTE — Op Note (Signed)
Brief history: The patient is a 71 year old white male who is anticoagulated secondary to atrial fibrillation. He was found to have bilateral subdural hematomas about 2 months ago. He has been followed with serial CT scans which have demonstrated progressive enlargement of his right subdural hematoma with increasing right to left midline shift. The patient was admitted over the weekend with a seizure. I discussed the various treatment options with the patient including surgery. He has weighed the risks, benefits, and alternatives to surgery and decided to proceed with right bur holes for evacuation of his right subdural hematoma.  Preop diagnosis: Bilateral subdural hematomas  Postop diagnosis: The same  Procedure: Right bur holes for evacuation of subdural hematoma  Surgeon: Dr. Earle Gell  Assistant: None  Anesthesia: Gen. endotracheal  Estimated blood loss: Minimal  Specimens: None  Drains: None  Complications: None  Description of procedure: The patient was brought to the operating room by the anesthesia team. General endotracheal anesthesia was induced. The patient remained in the supine position with a roll placed under his right shoulder. I applied the [place] 3 point head rest to the patient's calvarium. I then turn his head to the left exposing his right scalp. His right scalp was then prepared with Betadine scrub and Betadine solution. Sterile drapes were applied. I then injected the area to be incised with Marcaine with epinephrine solution. I used the scalpel scalpel to make a right frontal and right parietal occipital linear incision. I used the periosteal elevators to expose the underlying calvarium. I inserted cerebellar retractors for exposure. I then used a high-speed drill to create a right frontal and right parietal occipital burr hole. I coagulated the exposed dura with electrocautery. This exposed subdural membranes. After piercing the membranes there was release of  subdural hematoma fluid under some pressure. I irrigated the bur holes with bacitracin and saline solution till the irrigant came back crystal clear. I didn't get quite as much subdural hematoma fluid as expected so I made a third incision between the bur holes but a bit more cephalad. It then used a high-speed drill to create another burr hole. I coagulated that dura with electrocautery. Again I encountered subdural membranes and some chronic subdural hematoma. I then irrigated between the bur holes until the irrigant came back crystal clear. I then laid Gelfoam over the exposed dura. I removed the retractors. I then reapproximated patient's galea with interrupted 2-0 Vicryl suture. I reapproximated the skin with stainless steel staples. The wound was then coated with bacitracin ointment. A sterile dressing was applied. The drapes were removed. I then removed the [place] 3. headrest for the patient's calvarium. By report all sponge, instrument, and needle counts were correct at the end of this case.

## 2016-12-23 NOTE — Progress Notes (Signed)
Subjective:  The patient is alert and pleasant. He looks well.  Objective: Vital signs in last 24 hours: Temp:  [97.4 F (36.3 C)-98.3 F (36.8 C)] 97.4 F (36.3 C) (08/16 1451) Pulse Rate:  [82-114] 84 (08/16 1520) Resp:  [9-19] 19 (08/16 1520) BP: (115-137)/(77-97) 137/82 (08/16 1520) SpO2:  [100 %] 100 % (08/16 1520) Weight:  [61.7 kg (136 lb)] 61.7 kg (136 lb) (08/16 1214)  Intake/Output from previous day: No intake/output data recorded. Intake/Output this shift: Total I/O In: 1050 [I.V.:1050] Out: 100 [Blood:100]  Physical exam the patient is alert and pleasant. He is moving all 4 extremities well. His dressing is clean and dry.  Lab Results: No results for input(s): WBC, HGB, HCT, PLT in the last 72 hours. BMET No results for input(s): NA, K, CL, CO2, GLUCOSE, BUN, CREATININE, CALCIUM in the last 72 hours.  Studies/Results: No results found.  Assessment/Plan: The patient is doing well. He will likely go home tomorrow.  LOS: 0 days     Maurie Olesen D 12/23/2016, 3:27 PM

## 2016-12-23 NOTE — Anesthesia Postprocedure Evaluation (Signed)
Anesthesia Post Note  Patient: Phillip Solis  Procedure(s) Performed: Procedure(s) (LRB): BURR HOLES FOR EVACUATION OF SUBDURAL HEMATOMA (Right)     Patient location during evaluation: PACU Anesthesia Type: General Level of consciousness: awake and alert Pain management: pain level controlled Vital Signs Assessment: post-procedure vital signs reviewed and stable Respiratory status: spontaneous breathing, nonlabored ventilation and respiratory function stable Cardiovascular status: blood pressure returned to baseline and stable Postop Assessment: no signs of nausea or vomiting Anesthetic complications: no    Last Vitals:  Vitals:   12/23/16 1520 12/23/16 1544  BP: 137/82   Pulse: 84   Resp: 19   Temp:  36.8 C  SpO2: 100%     Last Pain:  Vitals:   12/23/16 1518  TempSrc:   PainSc: 0-No pain                 Margarite Vessel,W. EDMOND

## 2016-12-23 NOTE — Progress Notes (Signed)
[place] Progress Note Patient Name: KARANDEEP RESENDE DOB: Jun 22, 1945 MRN: [id number]   Date of Service  12/23/2016  HPI/Events of Note  B/L SDH S/p rt evacuation Chart reviewed. Noted to have recent CT scan with small PE vs filling artefact. No DVT Stable on cam check  eICU Interventions  No EICU interventions     Intervention Category Evaluation Type: New Patient Evaluation  Jachin Coury 12/23/2016, 4:09 PM

## 2016-12-23 NOTE — H&P (Signed)
Subjective: The patient is a 71 year old white male who has been anticoagulated for atrial fibrillation. He was found to have bowel subdural hematomas about 2 months ago. His anticoagulation has been stopped and we have been following him clinically with CT scans which have shown progressive enlargement of his right subdural hematoma with progressive right to left midline shift. The patient was admitted over the weekend with a seizure and he was started on anticonvulsants. I discussed the situation with the patient and his wife. We discussed the various treatment options including continued observation versus right bur holes for evacuation of the subdural hematoma. The patient has decided to proceed with surgery. He tells me that he stopped his anticoagulants after I last saw him in the office.  Past Medical History:  Diagnosis Date  . Atrial fibrillation with rapid ventricular response ([place]) 07/14/2015  . History of atrial fibrillation    in setting of endoscopy in 2011; resolved  . History of palpitations   . Stroke [facility])    2017    Past Surgical History:  Procedure Laterality Date  . LEFT AND RIGHT HEART CATHETERIZATION WITH CORONARY ANGIOGRAM N/A 07/23/2014   Procedure: LEFT AND RIGHT HEART CATHETERIZATION WITH CORONARY ANGIOGRAM;  Surgeon: [name], MD;  Location: [facility] CATH LAB;  Service: Cardiovascular;  Laterality: N/A;  . RADIOLOGY WITH ANESTHESIA N/A 07/11/2015   Procedure: RADIOLOGY WITH ANESTHESIA;  Surgeon: [name], MD;  Location: [place];  Service: Radiology;  Laterality: N/A;    Allergies  Allergen Reactions  . No Known Allergies     Social History  Substance Use Topics  . Smoking status: Former Smoker    Quit date: 03/19/1973  . Smokeless tobacco: Current User    Types: Chew  . Alcohol use 0.6 oz/week    1 Cans of beer per week     Comment: occasional beer    Family History  Problem Relation Age of Onset  . Heart Problems Mother   . Cancer Father   . Stroke  Father    Prior to Admission medications   Medication Sig Start Date End Date Taking? Authorizing Provider  apixaban (ELIQUIS) 5 MG TABS tablet Take 1 tablet (5 mg total) by mouth 2 (two) times daily. PLEASE CONTINUE HOLDING MEDICATION FOR NOW 12/20/16  Yes Barton Dubois, MD  atorvastatin (LIPITOR) 20 MG tablet Take 1 tablet (20 mg total) by mouth daily at 6 PM. 08/16/16  Yes Hilty, Nadean Corwin, MD  diphenhydrAMINE (BENADRYL) 25 MG tablet Take 1 tablet (25 mg total) by mouth every 8 (eight) hours as needed (allergic reactions). 12/20/16  Yes Barton Dubois, MD  levETIRAcetam (KEPPRA) 500 MG tablet Take 1 tablet (500 mg total) by mouth 2 (two) times daily. 12/20/16  Yes Barton Dubois, MD  metoprolol succinate (TOPROL-XL) 25 MG 24 hr tablet Take 1 tablet (25 mg total) by mouth daily. 11/22/16  Yes Daune Perch, NP  Multiple Vitamin (MULTIVITAMIN WITH MINERALS) TABS tablet Take 1 tablet by mouth daily.   Yes [provider]     Review of Systems  Positive ROS: As above  All other systems have been reviewed and were otherwise negative with the exception of those mentioned in the HPI and as above.  Objective: Vital signs in last 24 hours: Temp:  [98.3 F (36.8 C)] 98.3 F (36.8 C) (08/16 1214) Pulse Rate:  [114] 114 (08/16 1214) Resp:  [18] 18 (08/16 1214) BP: (115)/(77) 115/77 (08/16 1214) SpO2:  [100 %] 100 % (08/16 1214) Weight:  [  61.7 kg (136 lb)] 61.7 kg (136 lb) (08/16 1214)  General Appearance: Alert Head: Normocephalic, without obvious abnormality, atraumatic Eyes: PERRL, conjunctiva/corneas clear, EOM's intact,    Ears: Normal  Throat: Normal  Neck: Supple, Back: unremarkable Lungs: Clear to auscultation bilaterally, respirations unlabored Heart: Regular rate and rhythm, no murmur, rub or gallop Abdomen: Soft, non-tender Extremities: Extremities normal, atraumatic, no cyanosis or edema Skin: unremarkable  NEUROLOGIC:   Mental status: alert and oriented,Motor Exam  - grossly normal Sensory Exam - grossly normal Reflexes:  Coordination - grossly normal Gait - grossly normal Balance - grossly normal Cranial Nerves: I: smell Not tested  II: visual acuity  OS: Normal  OD: Normal   II: visual fields Full to confrontation  II: pupils Equal, round, reactive to light  III,VII: ptosis None  III,IV,VI: extraocular muscles  Full ROM  V: mastication Normal  V: facial light touch sensation  Normal  V,VII: corneal reflex  Present  VII: facial muscle function - upper  Normal  VII: facial muscle function - lower Normal  VIII: hearing Not tested  IX: soft palate elevation  Normal  IX,X: gag reflex Present  XI: trapezius strength  5/5  XI: sternocleidomastoid strength 5/5  XI: neck flexion strength  5/5  XII: tongue strength  Normal    Data Review Lab Results  Component Value Date   WBC 6.6 12/17/2016   HGB 12.0 (L) 12/17/2016   HCT 35.6 (L) 12/17/2016   MCV 89.7 12/17/2016   PLT 195 12/17/2016   Lab Results  Component Value Date   NA 137 12/17/2016   K 4.6 12/17/2016   CL 105 12/17/2016   CO2 26 12/17/2016   BUN 11 12/17/2016   CREATININE 0.92 12/17/2016   GLUCOSE 99 12/17/2016   Lab Results  Component Value Date   INR 1.08 12/16/2016    Assessment/Plan: Bilateral subdural hematomas, seizures, atrial fibrillation, etc.: I have discussed the situation with the patient and his wife. We have discussed various treatment options including surgery. I have described the surgical treatment option of right bur holes for evacuation of the subdural hematoma. We have discussed the risks, benefits, alternatives, expected postoperative course, and the likelihood of achieving goals with surgery. I have answered all their questions. He has decided to proceed with surgery.   Newman Pies D 12/23/2016 12:54 PM

## 2016-12-23 NOTE — Transfer of Care (Signed)
Immediate Anesthesia Transfer of Care Note  Patient: Phillip Solis  Procedure(s) Performed: Procedure(s) with comments: BURR HOLES FOR EVACUATION OF SUBDURAL HEMATOMA (Right) - right  Patient Location: PACU  Anesthesia Type:General  Level of Consciousness: awake, alert , oriented and patient cooperative  Airway & Oxygen Therapy: Patient Spontanous Breathing and Patient connected to face mask oxygen  Post-op Assessment: Report given to RN and Post -op Vital signs reviewed and stable  Post vital signs: Reviewed and stable  Last Vitals:  Vitals:   12/23/16 1214  BP: 115/77  Pulse: (!) 114  Resp: 18  Temp: 36.8 C  SpO2: 100%    Last Pain:  Vitals:   12/23/16 1214  TempSrc: Oral      Patients Stated Pain Goal: 2 (66/44/03 4742)  Complications: No apparent anesthesia complications

## 2016-12-23 NOTE — Anesthesia Procedure Notes (Signed)
Procedure Name: Intubation Date/Time: 12/23/2016 2:11 PM Performed by: Everlean Cherry A Pre-anesthesia Checklist: Patient identified, Emergency Drugs available, Suction available and Patient being monitored Patient Re-evaluated:Patient Re-evaluated prior to induction Oxygen Delivery Method: Circle system utilized Preoxygenation: Pre-oxygenation with 100% oxygen Induction Type: IV induction Ventilation: Mask ventilation without difficulty and Oral airway inserted - appropriate to patient size Laryngoscope Size: Sabra Heck and 2 Grade View: Grade I Tube type: Oral Tube size: 7.5 mm Number of attempts: 1 Airway Equipment and Method: Stylet Placement Confirmation: ETT inserted through vocal cords under direct vision,  positive ETCO2 and breath sounds checked- equal and bilateral Secured at: 23 cm Tube secured with: Tape Dental Injury: Teeth and Oropharynx as per pre-operative assessment

## 2016-12-24 ENCOUNTER — Encounter (HOSPITAL_COMMUNITY): Payer: Self-pay | Admitting: Neurosurgery

## 2016-12-24 ENCOUNTER — Inpatient Hospital Stay (HOSPITAL_COMMUNITY): Payer: Medicare HMO

## 2016-12-24 NOTE — Progress Notes (Signed)
Initial Nutrition Assessment  DOCUMENTATION CODES:   Non-severe (moderate) malnutrition in context of chronic illness  INTERVENTION:    Ensure Enlive po BID, each supplement provides 350 kcal and 20 grams of protein  MVI daily  NUTRITION DIAGNOSIS:   Malnutrition (moderate) related to chronic illness (SDH) as evidenced by mild depletion of muscle mass, moderate depletions of muscle mass, percent weight loss.  GOAL:   Patient will meet greater than or equal to 90% of their needs  MONITOR:   PO intake, Supplement acceptance, Labs, I & O's  REASON FOR ASSESSMENT:   Malnutrition Screening Tool    ASSESSMENT:   71 yo male with PMH of A fib with RVR, stroke, seizures who was admitted on 8/16 with progressive enlargement of bilateral subdural hematomas s/p evacuation of hematomas on admission.  Patient reports decline in appetite and intake over the past few months. He has been drinking Ensure supplements at home. Nutrition-Focused physical exam completed. Findings are no fat depletion, mild-moderate muscle depletion, and no edema.  12% weight loss within the past 5 months is significant. Labs and medications reviewed.  Diet Order:  Diet regular Room service appropriate? Yes; Fluid consistency: Thin  Skin:   (incision to head)  Last BM:  PTA  Height:   Ht Readings from Last 1 Encounters:  12/23/16 5\' 4"  (1.626 m)    Weight:   Wt Readings from Last 1 Encounters:  12/23/16 134 lb 14.7 oz (61.2 kg)    Ideal Body Weight:  59.1 kg  BMI:  Body mass index is 23.16 kg/m.  Estimated Nutritional Needs:   Kcal:  1650-1850  Protein:  80-90 gm  Fluid:  1.8 L  EDUCATION NEEDS:   Education needs addressed (discussed ways to increase intake of protein & calories at home)  Molli Barrows, [place], [place], [place] Pager [phone] After Hours Pager [phone]

## 2016-12-24 NOTE — Discharge Summary (Signed)
Physician Discharge Summary  Patient ID: Phillip Solis MRN: [id number] DOB/AGE: 1946-01-14 71 y.o.  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  Consults: None  Significant Diagnostic Studies: None  Treatments: surgery: Right-sided bur holes for drainage of chronic subdural hematoma.  Discharge Exam: Blood pressure 108/70, pulse 71, temperature 98.5 F (36.9 C), resp. rate 17, height 5\' 4"  (1.626 m), weight 61.2 kg (134 lb 14.7 oz), SpO2 100 %. Station and gait are intact. Patient ambulates well. Dressings are clean and dry.  Disposition: 01-Home or Self Care  Discharge Instructions    Call MD for:  redness, tenderness, or signs of infection (pain, swelling, redness, odor or green/yellow discharge around incision site)    Complete by:  As directed    Call MD for:  severe uncontrolled pain    Complete by:  As directed    Call MD for:  temperature >100.4    Complete by:  As directed    Diet - low sodium heart healthy    Complete by:  As directed    Discharge instructions    Complete by:  As directed    Keep incision dry. Activities as tolerated. No alcohol consumption. Patient is not to drive. Return to office in 10 days for staple removal. Call [phone] for appointment with Dr. Arnoldo Morale.   Increase activity slowly    Complete by:  As directed      Allergies as of 12/24/2016      Reactions   No Known Allergies       Medication List    STOP taking these medications   apixaban 5 MG Tabs tablet Commonly known as:  ELIQUIS     TAKE these medications    atorvastatin 20 MG tablet Commonly known as:  LIPITOR Take 1 tablet (20 mg total) by mouth daily at 6 PM.   diphenhydrAMINE 25 MG tablet Commonly known as:  BENADRYL Take 1 tablet (25 mg total) by mouth every 8 (eight) hours as needed (allergic reactions).   levETIRAcetam 500 MG tablet Commonly known as:  KEPPRA Take 1 tablet (500 mg total) by mouth 2 (two) times daily.   metoprolol succinate 25 MG 24 hr tablet Commonly known as:  TOPROL-XL Take 1 tablet (25 mg total) by mouth daily.   multivitamin with minerals Tabs tablet Take 1 tablet by mouth daily.        SignedEarleen Newport 12/24/2016, 4:57 PM

## 2016-12-24 NOTE — Progress Notes (Signed)
Patient ID: Phillip Solis, male   DOB: [birth year], 71 y.o.   MRN: [id number] Vital signs are stable Patient states he feels much better He has not been out of bed or ambulated yet Will check postop CT Ambulate and check by physical therapy If stable can discharge soon.

## 2017-01-04 ENCOUNTER — Other Ambulatory Visit: Payer: Medicare HMO

## 2017-01-04 ENCOUNTER — Other Ambulatory Visit: Payer: Self-pay | Admitting: Neurosurgery

## 2017-01-04 DIAGNOSIS — S065X9A Traumatic subdural hemorrhage with loss of consciousness of unspecified duration, initial encounter: Secondary | ICD-10-CM

## 2017-01-04 DIAGNOSIS — S065XAA Traumatic subdural hemorrhage with loss of consciousness status unknown, initial encounter: Secondary | ICD-10-CM

## 2017-01-06 ENCOUNTER — Ambulatory Visit
Admission: RE | Admit: 2017-01-06 | Discharge: 2017-01-06 | Disposition: A | Discharge status: Home / Self Care | Payer: Medicare HMO | Source: Ambulatory Visit | Attending: Neurosurgery | Admitting: Neurosurgery

## 2017-01-06 DIAGNOSIS — S065X9A Traumatic subdural hemorrhage with loss of consciousness of unspecified duration, initial encounter: Secondary | ICD-10-CM

## 2017-01-06 DIAGNOSIS — S065XAA Traumatic subdural hemorrhage with loss of consciousness status unknown, initial encounter: Secondary | ICD-10-CM

## 2017-01-25 ENCOUNTER — Other Ambulatory Visit: Payer: Self-pay | Admitting: Internal Medicine

## 2017-01-25 NOTE — Telephone Encounter (Signed)
Rx(s) sent to pharmacy electronically.  

## 2017-01-28 ENCOUNTER — Other Ambulatory Visit: Payer: Self-pay | Admitting: Neurosurgery

## 2017-01-28 DIAGNOSIS — S065X9A Traumatic subdural hemorrhage with loss of consciousness of unspecified duration, initial encounter: Secondary | ICD-10-CM

## 2017-01-28 DIAGNOSIS — S065XAA Traumatic subdural hemorrhage with loss of consciousness status unknown, initial encounter: Secondary | ICD-10-CM

## 2017-01-31 ENCOUNTER — Ambulatory Visit
Admission: RE | Admit: 2017-01-31 | Discharge: 2017-01-31 | Disposition: A | Discharge status: Home / Self Care | Payer: Medicare HMO | Source: Ambulatory Visit | Attending: Neurosurgery | Admitting: Neurosurgery

## 2017-01-31 DIAGNOSIS — S065X9A Traumatic subdural hemorrhage with loss of consciousness of unspecified duration, initial encounter: Secondary | ICD-10-CM

## 2017-01-31 DIAGNOSIS — S065XAA Traumatic subdural hemorrhage with loss of consciousness status unknown, initial encounter: Secondary | ICD-10-CM

## 2017-02-23 ENCOUNTER — Telehealth: Payer: Self-pay | Admitting: Internal Medicine

## 2017-02-23 NOTE — Telephone Encounter (Signed)
This is his second subdural hematoma - previously it was on warfarin. I will need to see him to discuss options - may be a Watchman candidate - I would be hesitant to restart him on anticoagulation again.  Dr. Lemmie Evens

## 2017-02-23 NOTE — Telephone Encounter (Signed)
Gabbie at [facility] said if there is any question please call her.

## 2017-02-23 NOTE — Telephone Encounter (Signed)
Spoke with patient and he stated that he was advised by Dr Arnoldo Morale to call Dr Debara Pickett in regards to resuming the Eliquis. He has completed his Andres Labrum as advised by Dr Arnoldo Morale. He saw Dr Arnoldo Morale on 01/31/17, requested office note. Per office note from Dr Arnoldo Morale follow up head CT showed subdural hematoma hard largely resolved. Will forward to Dr Debara Pickett for review

## 2017-02-23 NOTE — Telephone Encounter (Signed)
New message    Phillip Solis is calling for pt. She said that pt has stopped seizure medications and has not started his blood thinner yet. RN is concerned about pt not having blood thinner. Please call pt.

## 2017-02-23 NOTE — Telephone Encounter (Signed)
Advised patient and scheduled office visit for 03/09/17 at 9:30

## 2017-03-09 ENCOUNTER — Encounter: Payer: Self-pay | Admitting: Internal Medicine

## 2017-03-09 ENCOUNTER — Ambulatory Visit (INDEPENDENT_AMBULATORY_CARE_PROVIDER_SITE_OTHER): Payer: Medicare HMO | Admitting: Internal Medicine

## 2017-03-09 VITALS — BP 101/69 | HR 104 | Ht 67.0 in | Wt 132.8 lb

## 2017-03-09 DIAGNOSIS — S065X9A Traumatic subdural hemorrhage with loss of consciousness of unspecified duration, initial encounter: Secondary | ICD-10-CM

## 2017-03-09 DIAGNOSIS — I480 Paroxysmal atrial fibrillation: Secondary | ICD-10-CM

## 2017-03-09 DIAGNOSIS — Z8673 Personal history of transient ischemic attack (TIA), and cerebral infarction without residual deficits: Secondary | ICD-10-CM | POA: Diagnosis not present

## 2017-03-09 DIAGNOSIS — S065XAA Traumatic subdural hemorrhage with loss of consciousness status unknown, initial encounter: Secondary | ICD-10-CM

## 2017-03-09 NOTE — Progress Notes (Signed)
Grossly normal   OFFICE NOTE  Chief Complaint:  Hospital follow-up for recurrent SDH  Primary Care Physician: Algis Greenhouse, MD  HPI:  Phillip Solis is a 71 year old gentleman who I first saw in 2011 with new-onset atrial fibrillation in the setting of vomiting and during an endoscopy to remove food which was stuck in his throat. It resolved spontaneously and he has had no recurrence that I am aware of. He occasionally gets palpitations; however, is maintained on bisoprolol 5 mg twice a day. He also takes aspirin due to a low CHADS2 score, which is probably 0. Unfortunately he does not have a primary care provider. Today's also described increasing shortness of breath over the past several months which is worse he notes when walking long distances, especially across the parking lot at [employer]. He says that when he arrives from his car he is markedly short of breath and notes that he's having pain in his legs that usually discontinues his walk. As he stops the symptoms improved. The symptoms have been going on again for several months but increasing somewhat in frequency. Phillip Solis has a strong smoking history with one and half pack per day smoking for over 30 years but he quit a number of years ago.  He has never had an ischemic evaluation to my knowledge. His EKG today does show a interventricular conduction delay and sinus bradycardia with nonspecific T-wave changes.  I saw Phillip Solis back in the office today nearly a year and a half after his last appointment. He had a number of medical problems which I felt needed further evaluation. I had ordered a stress test, abdominal ultrasound and peripheral Dopplers, but he had none of those tests performed. Apparently he lost his insurance and stopped working. He is currently on Medicare with a [employer] supplement. He recently was in [facility] for what he thought was a sinus infection. From the best I can tell he had imaging including a chest x-ray  and a head CT. He was placed on antibiotics however he was told that he had "fluid around his heart". Laboratory work indicated an elevated BNP of 1400. There is no evidence that he was diuresed and an echocardiogram was not performed. Chest x-ray demonstrated hyperinflation with mild linear atelectasis at the lung bases. He was discharged from the emergency department.  With regard to his claudication he reports she's been walking a lot more and he reports his leg pain has improved somewhat.  Phillip Solis returns today for follow-up. He underwent screening abdominal ultrasound as well as lower extremity arterial Dopplers for leg pain. This demonstrated normal ABIs and no evidence for arterial insufficiency. His abdominal aorta was normal. He also underwent an echocardiogram because of his recent episode of heart failure and elevated BNP. This does demonstrate a cardiomyopathy with EF of 35-40%. I'm concerned about underlying coronary artery disease. His symptoms have improved.  I saw Phillip Solis back in the office today. He underwent left and right heart catheterization by myself, the results are as follows:  Hemodynamics: Central Aortic Pressure / Mean Aortic Pressure: 113/64 LV Pressure / LV End diastolic Pressure: 7  Left Ventriculography: EF: 35-40% Wall Motion: global hypokinesis  Right Heart Data:  RA - 2  RV - 22/2  PA - 20/5 (12)   PCWP - 4  TPG - 8  FCO/CI - 3.92 L/min, 2.24 L/min  TDCO/CI - 5.94 L/min, 3.39 L/min  SVR - 22 Wood units ( dynscm?5/80)  PVR -  2.04 Wood units ( dynscm?5/80)  PA Sat% - 66  AO Sat% - 97  Coronary Angiographic Data:  Left Main: Normal  Left Anterior Descending (LAD): Angiographically normal - smaller caliber, tapers around the apex.  1st diagonal (D1): Moderate-sized vessel, no stenosis.  2nd diagonal (D2): Small to moderate-sized, no stenosis.  Circumflex (LCx):  Angiographically normal, follows the AV groove and gives off a single mid-vessel OM vessel.  1st obtuse marginal: Angiographically normal  Right Coronary Artery: Dominant vessel. Larger caliber. No stenosis.  right ventricle branch of right coronary artery: normal  posterior descending artery: No stenosis  posterior lateral branch: No stenosis  Impression: 1. Angiographically normal coronary arteries 2. Severe global hypokinesis, LVEF 35-40% 3. Compensated right and left heart filling pressures 4. Moderately decreased cardiac output  He's had no problems from his catheterization. Overall he feels well. As mentioned above his right heart catheterization shows well compensated filling pressures, but he is not on diuretic. He's currently not on an ACE or ARB, and I believe he did good candidate to start on and trust oh. This would give him some benefit from the ARB as well as natruretic.  Phillip Solis returns today for follow-up. He reports he is feeling well. At last visit I started him on Entresto. He seems to be tolerating this although blood pressure remains low at 100/60 and has not allowed up titration of the medicine. Unfortunately he is not yet established a coverage from the provider so cost of the medicine is is not affordable. We'll provide him with more samples today however he does need to contact patient assistance. He's also on bisoprolol and aspirin. He denies any shortness of breath and has not had any congestive symptoms and therefore he is not currently on any diuretic other than the sacubitril.  I saw Phillip Solis back in the office today for follow-up. He reports he is breathing somewhat better. He is compliant with his medications although it's not clear yet whether or not he is been established to receive Entresto other than with her samples. Blood pressure remained stable in the low 100s. He is currently on the 24/26 mg dose of Entresto. A repeat echo recently shows an  improvement in EF up to 45%. LV filling pressures remain elevated. He still gets some shortness of breath with moderate exertion but he reports this is improved. He says that he is urinating more frequently. As mentioned above he has a remote history of PAF and given his new congestive heart failure, his CHADSVASC score is now 1 (there is no history of hypertension and he is less than age 75). He has not had any recent recurrence of atrial fibrillation therefore were maintaining him on low-dose aspirin.  11/26/2015  Phillip Solis returns today for follow-up. Unfortunately he was recently hospitalized for an acute stroke. He had a history of remote PAF without any recent recurrence. We were treating him for his cardiomyopathy and actually improved with his ejection fraction on Entresto. This medicine, however was stopped, possibly due to hypotension during this recent admission. He was in A. fib with RVR. There was difficulty in getting rate control however he spontaneously converted. He is maintaining sinus rhythm since then. He is now on Eliquis and has followed up with Dr. Curt Bears for possible antiarrhythmic medication although since he is in sinus rhythm I do not see ongoing need for EP follow-up. With regards to his cardiomyopathy, his recent echo actually shows EF is up to 45-50%. He's currently on  Toprol-XL. He has regained a lot of his strength and feels fairly well with occasional speech problems.  11/15/2016  Phillip Solis was seen today follow-up. Unfortunately recently was admitted for headaches. He was found to have bilateral frontal subdural hematomas. He denies any falls or head trauma was thought that this is likely spontaneous. He was on warfarin. INR was therapeutic at 2.46 however he was given vitamin K to reverse his INR. He was also given steroids due to concern for increased intracranial pressure. He was seen by neurosurgery felt that these would resolve spontaneously. He reports recurrent  headache, particularly worse when sneezing or any Valsalva maneuvers. It was felt that because of his elevated risk of stroke and CHADSVASC score of 6, he would ultimately benefit from going on a novel oral anticoagulant. He is not on aspirin.  03/09/2017  Phillip Solis returns today for follow-up.  Unfortunately he has had a recurrent idiopathic subdural hematoma develop after restarting his Eliquis in July.  He presented with frequent headaches and was found to have hematomas on his CT scan and underwent neurosurgery with Dr. Arnoldo Morale.  This since consisted of drainage and due to associated seizure activity he was placed on Keppra.  He reports he has made a significant improvement in recent office notes from Dr. Arnoldo Morale indicates that he is doing well after his subdural hematoma evacuation.  He is not on any anticoagulation.  His PCP recently restarted his atorvastatin and he is on metoprolol.  He remains in long-standing persistent if not permanent atrial fibrillation with a chads vas score of 6.  Heart rate today is 104.  He is asymptomatic with his A. fib.  He says he generally feels well.  We had a long discussion about his stroke risk today.  I am concerned about 2 prior subdural hematomas including one that was apparently spontaneous.  This was in the setting of anticoagulation both with warfarin and Eliquis.  At this point, I do not consider him a long-term anticoagulation candidate due to recurrent subdural hematomas.  We discussed options for him including maintaining him on low-dose aspirin which would be inferior given his high chads vas score or consideration for a watchman left atrial appendage occluder device.  I explained this device at some length today and feel that he may be a good candidate for this.  Unfortunately we are not currently performing this service in [place] however are referring patients to [facility] in [place].   PMHx:  Past Medical History:  Diagnosis Date  . Atrial  fibrillation with rapid ventricular response ([place]) 07/14/2015  . History of atrial fibrillation    in setting of endoscopy in 2011; resolved  . History of palpitations   . Stroke [facility])    2017    Past Surgical History:  Procedure Laterality Date  . BURR HOLE Right 12/23/2016   Procedure: BURR HOLES FOR EVACUATION OF SUBDURAL HEMATOMA;  Surgeon: Newman Pies, MD;  Location: [facility];  Service: Neurosurgery;  Laterality: Right;  right  . LEFT AND RIGHT HEART CATHETERIZATION WITH CORONARY ANGIOGRAM N/A 07/23/2014   Procedure: LEFT AND RIGHT HEART CATHETERIZATION WITH CORONARY ANGIOGRAM;  Surgeon: [name], MD;  Location: [facility] CATH LAB;  Service: Cardiovascular;  Laterality: N/A;  . RADIOLOGY WITH ANESTHESIA N/A 07/11/2015   Procedure: RADIOLOGY WITH ANESTHESIA;  Surgeon: [name], MD;  Location: [facility];  Service: Radiology;  Laterality: N/A;    FAMHx:  Family History  Problem Relation Age of Onset  . Heart Problems  Mother   . Cancer Father   . Stroke Father     SOCHx:   reports that he quit smoking about 44 years ago. His smokeless tobacco use includes Chew. He reports that he drinks about 0.6 oz of alcohol per week . He reports that he does not use drugs.  ALLERGIES:  Allergies  Allergen Reactions  . No Known Allergies     ROS: Pertinent items noted in HPI and remainder of comprehensive ROS otherwise negative.  HOME MEDS: Current Outpatient Prescriptions  Medication Sig Dispense Refill  . aspirin EC 81 MG tablet Take 81 mg by mouth daily.    Marland Kitchen atorvastatin (LIPITOR) 20 MG tablet TAKE 1 TABLET BY MOUTH ONCE DAILY AT 6PM 90 tablet 2  . diphenhydrAMINE (BENADRYL) 25 MG tablet Take 1 tablet (25 mg total) by mouth every 8 (eight) hours as needed (allergic reactions).    . metoprolol succinate (TOPROL-XL) 25 MG 24 hr tablet Take 1 tablet (25 mg total) by mouth daily. 90 tablet 1  . Multiple Vitamin (MULTIVITAMIN WITH MINERALS) TABS tablet Take 1 tablet by mouth daily.       No current facility-administered medications for this visit.     LABS/IMAGING: No results found for this or any previous visit (from the past 48 hour(s)). No results found.  VITALS: BP 101/69   Pulse (!) 104   Ht 5\' 7"  (1.702 m)   Wt 132 lb 12.8 oz (60.2 kg)   BMI 20.80 kg/m   EXAM: General appearance: alert and no distress Neck: no carotid bruit and no JVD Lungs: clear to auscultation bilaterally Heart: regular rate and rhythm, S1, S2 normal, no murmur, click, rub or gallop Abdomen: soft, non-tender; bowel sounds normal; no masses,  no organomegaly Extremities: extremities normal, atraumatic, no cyanosis or edema Pulses: 2+ and symmetric Skin: Skin color, texture, turgor normal. No rashes or lesions Neurologic: GroPsych: Pleasanty normal Psych: Pleasant   EKG: Atrial fibrillation with rapid ventricular response at 104, low voltage QRS-personally reviewed  ASSESSMENT: 1. Nonischemic cardiomyopathy- (now improved to 45-50%), normal coronaries by recent catheterization 2. Leg pain - normal ABI's (no clear PAD) 3. Paroxysmal atrial fibrillation on aspirin (CHADSVASC score of 6) 4. Extensive former tobacco smoker 5. History of stroke 6. 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   Phillip Solis has had 2 subdural hematomas and prior stroke on both warfarin and Eliquis.  He has a chads vas score of 6.  He is not considered a long-term anticoagulation candidate due to recurrent bleeding however I think he may be a good candidate for the watchman left atrial appendage occluder device.  We discussed that at length today.  He should be a candidate for at least short-term anticoagulation.  I provided information and he will contact us to discuss possibly going forward.  We would then refer him to [facility] in [place].  In the meantime I recommended starting low-dose aspirin 81 mg daily for some mild benefit.  [name], MD, [facility] Attending  Cardiologist [place] 03/09/2017, 3:19 PM

## 2017-03-09 NOTE — Patient Instructions (Signed)
Your physician has recommended you make the following change in your medication: START aspirin 81mg  once daily  Your physician recommends that you schedule a follow-up appointment in: [place] with Dr. Debara Pickett.  [email or web address]

## 2017-03-18 ENCOUNTER — Telehealth: Payer: Self-pay | Admitting: Internal Medicine

## 2017-03-18 NOTE — Telephone Encounter (Signed)
Returned the call to the daughter. She is currently not on the [facility]. She stated that she had been previously. She has been informed that the patient will need to add her to the list so he may be discussed.  She did state that she wants the patient to have the procedure done that was discussed at the previous office visit, which she attended. The patient currently refuses. She stated that she tell the patient to call back later to discuss the procedure. Will route to the provider for his knowledge.

## 2017-03-18 NOTE — Telephone Encounter (Signed)
New message    Pt daughter is calling stating that when he was here Dr. Debara Pickett talked about doing a referral for him to have a procedure. She is asking if that referral can be done so pt can have procedure.

## 2017-03-18 NOTE — Telephone Encounter (Signed)
Attempted to return call to daughter. No answer. Did not leave VM  Patient has MD follow up on 11/19

## 2017-03-18 NOTE — Telephone Encounter (Signed)
Ok .. Mr. Withem needs to agree to the procedure before we make the referral.  Dr. Lemmie Evens

## 2017-03-21 NOTE — Telephone Encounter (Signed)
Follow up    Daughter is trying to get put on DPR , please call back

## 2017-03-21 NOTE — Telephone Encounter (Signed)
Left message to call back  

## 2017-03-28 ENCOUNTER — Ambulatory Visit: Payer: Medicare HMO | Admitting: Internal Medicine

## 2017-03-28 NOTE — Telephone Encounter (Signed)
Patient scheduled Jun 06, 2017

## 2017-04-07 ENCOUNTER — Other Ambulatory Visit: Payer: Self-pay | Admitting: Cardiology

## 2017-04-07 NOTE — Telephone Encounter (Signed)
Medication Detail    Disp Refills Start End   metoprolol succinate (TOPROL-XL) 25 MG 24 hr tablet 90 tablet 1 11/22/2016    Sig - Route: Take 1 tablet (25 mg total) by mouth daily. - Oral   Sent to pharmacy as: metoprolol succinate (TOPROL-XL) 25 MG 24 hr tablet   E-Prescribing Status: Receipt confirmed by pharmacy (11/22/2016 8:01 PM EDT)   Pharmacy   [place], [place]

## 2017-04-10 IMAGING — CT CT HEAD W/O CM
1 series · 15 of 30 positions shown, 19 images · non-contrast
Comparison: Multiple previous studies from 07/11/2015 and
07/12/2015.

CLINICAL DATA: Follow-up examination for stroke. Status post
catheter directed revascularization with intra-arterial tPA.

EXAM:
CT HEAD WITHOUT CONTRAST
TECHNIQUE: Contiguous axial images were obtained from the base of the skull
through the vertex without intravenous contrast.

[Series 2: head 5.0 h30s · axial · 0.46mm/px · z∈[-134,+21]mm · 15 of 35 slices shown, 19 images]
[im 2/35  brain]
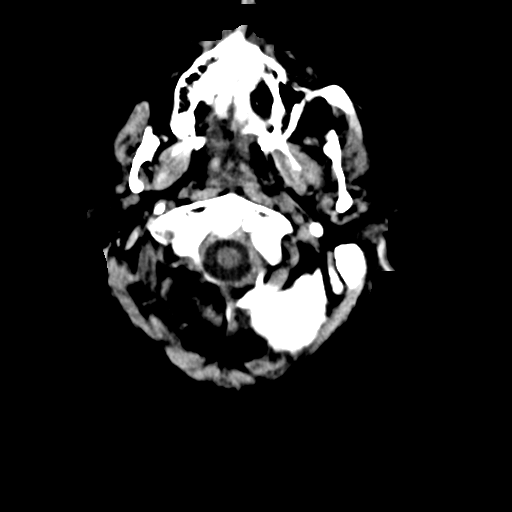
[im 2/35  bone]
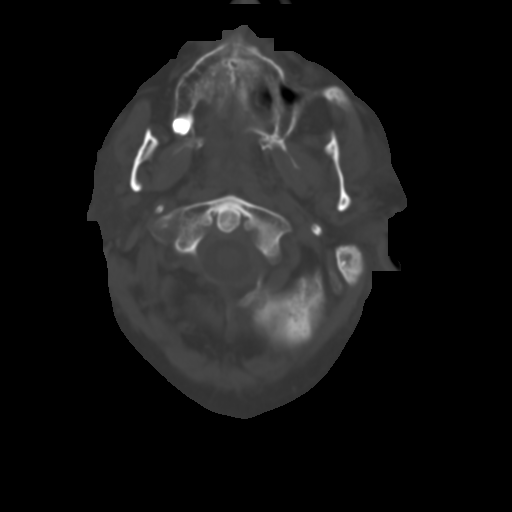
[im 4/35  brain]
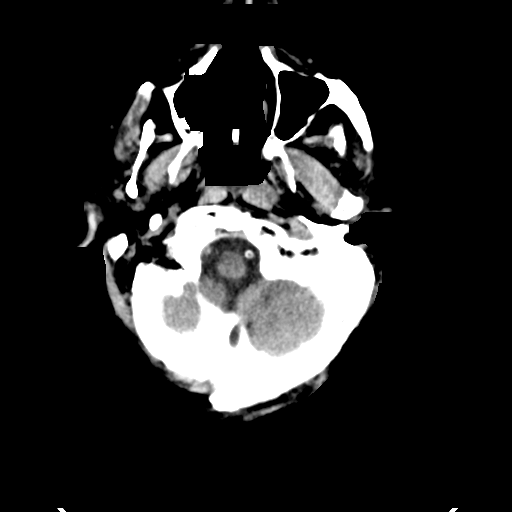
[im 6/35  brain]
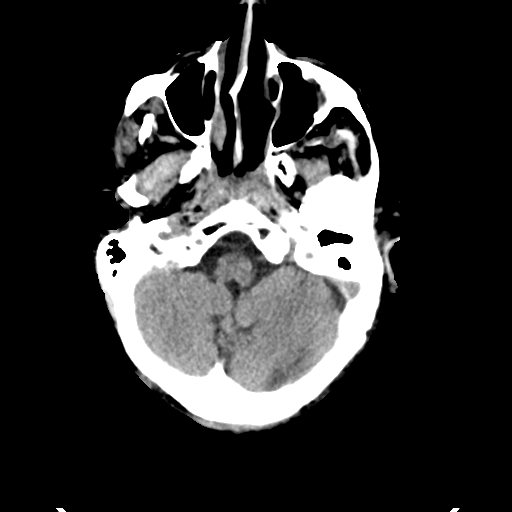
[im 9/35  brain]
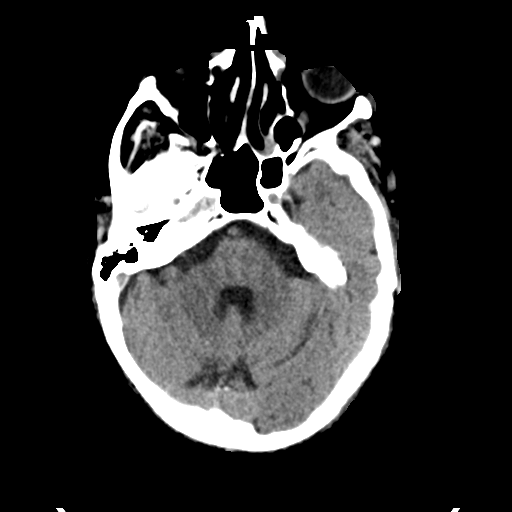
[im 11/35  brain]
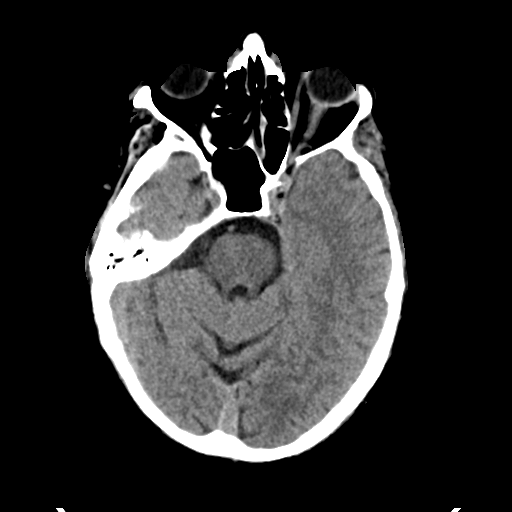
[im 11/35  bone]
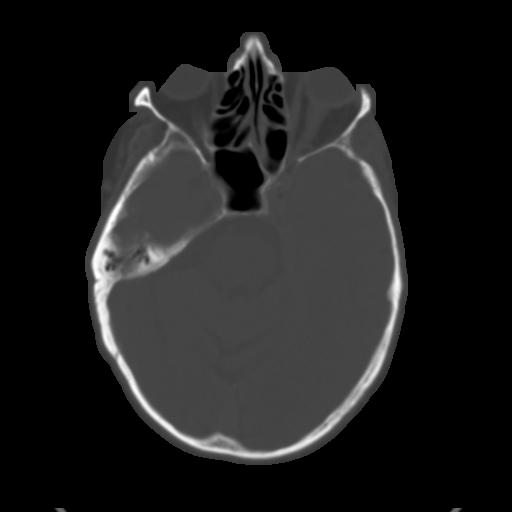
[im 13/35  brain]
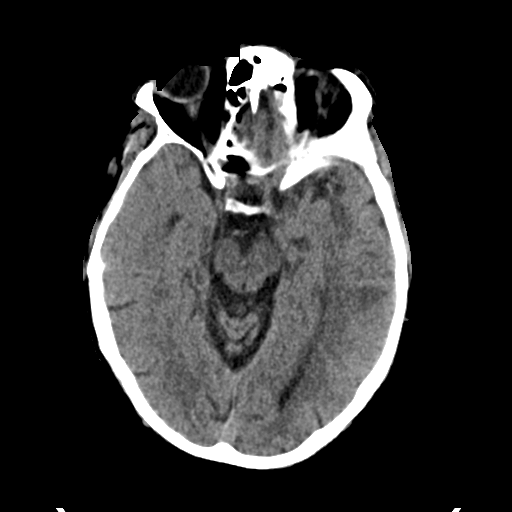
[im 16/35  brain]
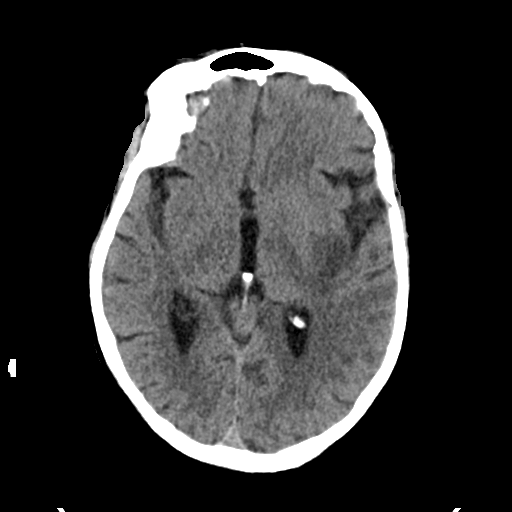
[im 18/35  brain]
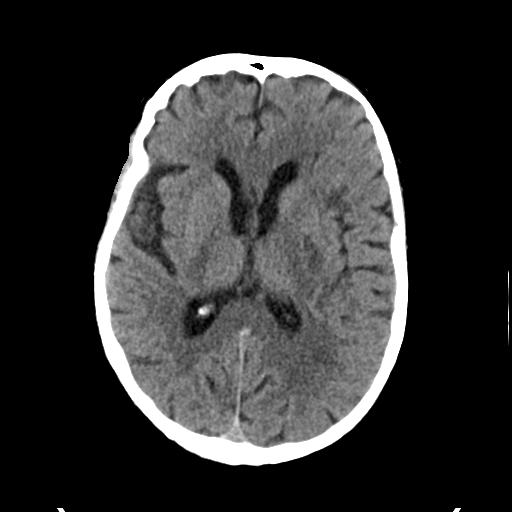
[im 19/35  brain]
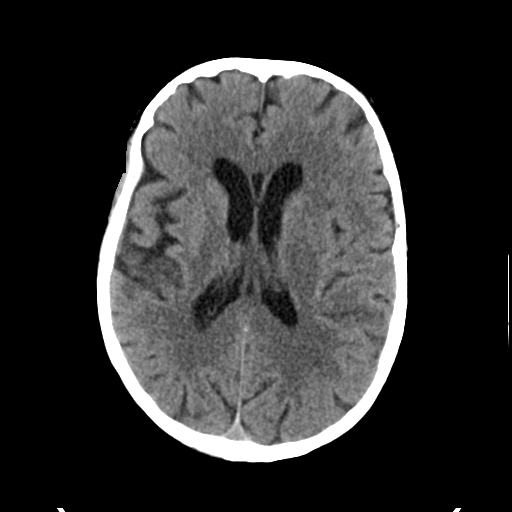
[im 19/35  bone]
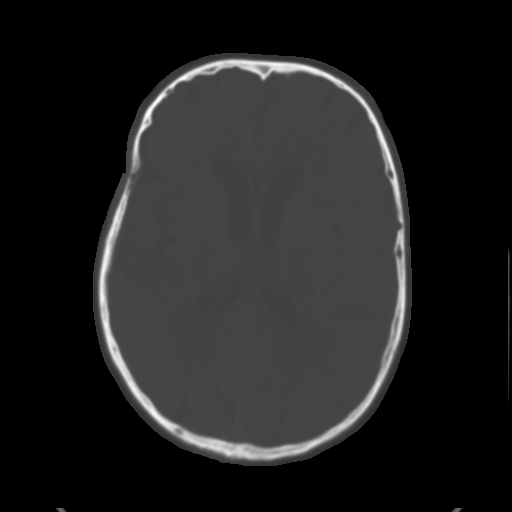
[im 22/35  brain]
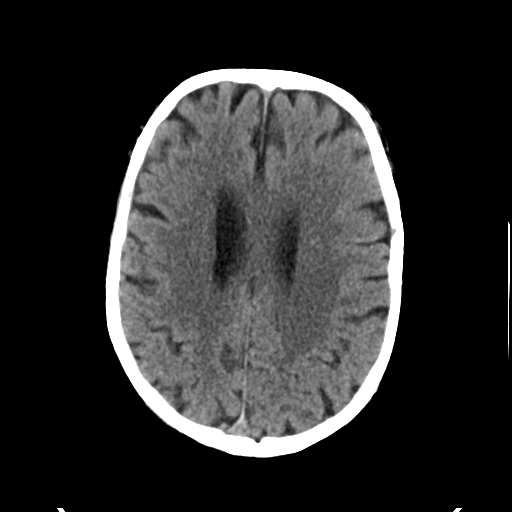
[im 24/35  brain]
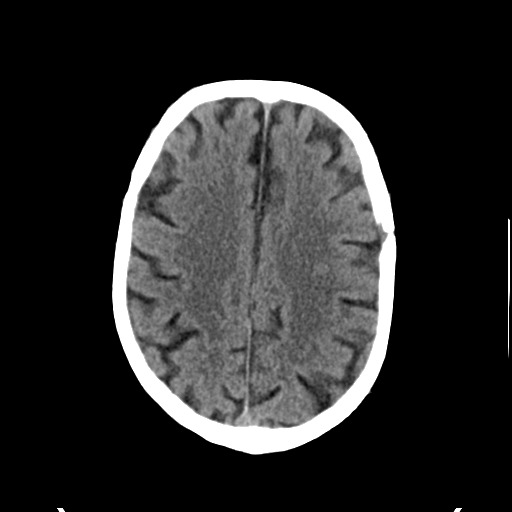
[im 26/35  brain]
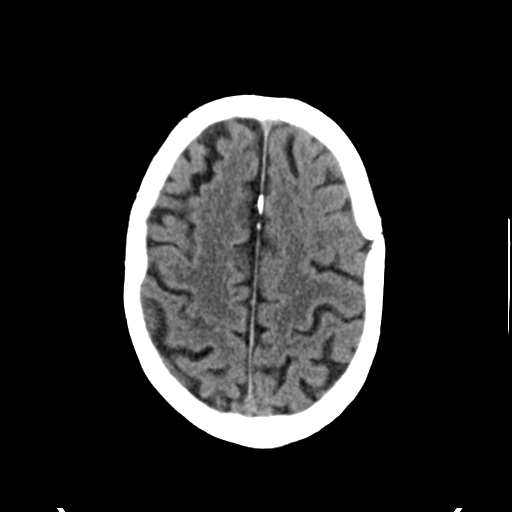
[im 29/35  brain]
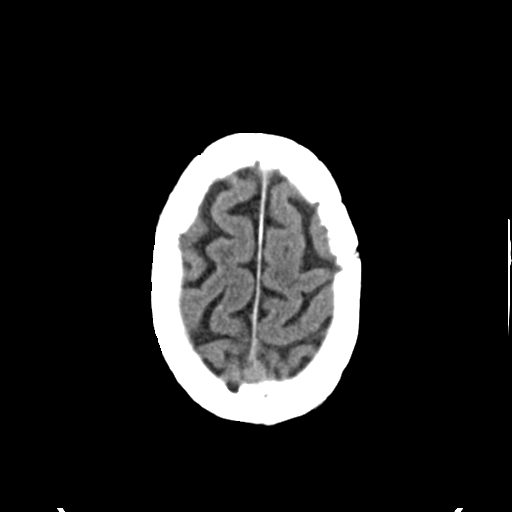
[im 29/35  bone]
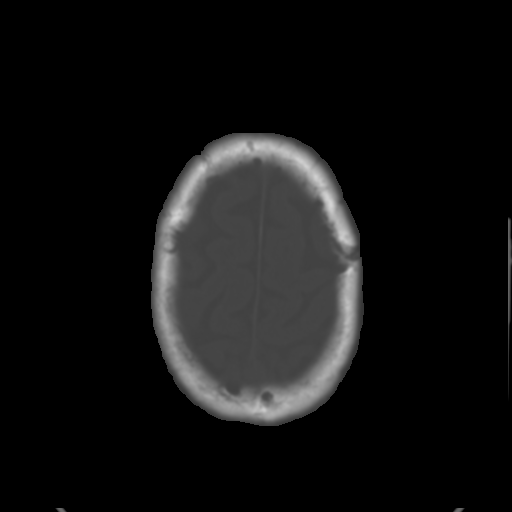
[im 31/35  brain]
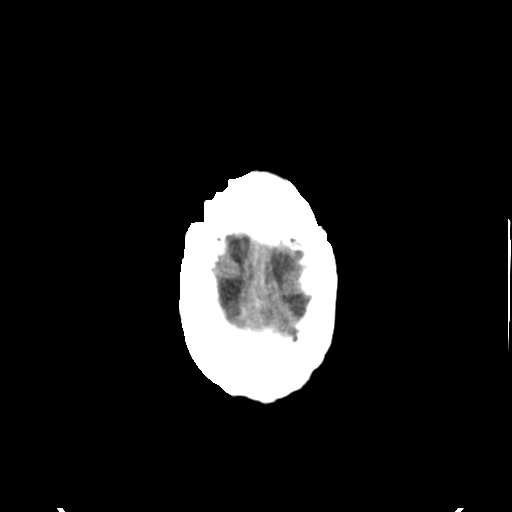
[im 33/35  brain]
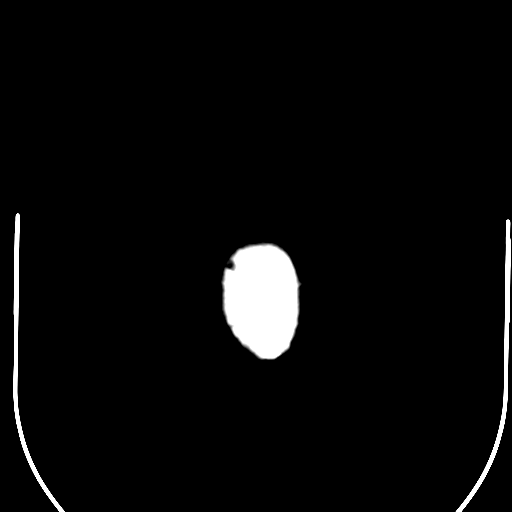

[15 of 30 positions shown; findings below may reference images not displayed]

FINDINGS: Age-related cerebral atrophy with chronic small vessel ischemic type
changes again noted. There is evolving patchy hypodensity within the
left MCA territory involving the left insular region with extension
into the left lentiform nucleus, compatible with evolving left MCA
territory infarct focal hypodensity within the left temporal region
consistent with evolving ischemia. Probable additional patchy
hypodensity with subtle loss of gray-white matter differentiation
more superficially and posteriorly within the posterior left frontal
lobe. No significant mass effect or evidence for hemorrhagic
transformation. Previously seen dense left M1 segment no longer
visualized.

No other acute or evolving large vessel territory infarct. No
intracranial hemorrhage. No mass lesion, midline shift, or mass
effect. No hydrocephalus. No extra-axial fluid collection.

Scalp soft tissues within normal limits. No acute abnormality about
the orbits.

Scattered mucosal thickening within the paranasal sinuses. Mastoid
air cells grossly clear. Middle ear cavities clear.

Calvarium intact.
IMPRESSION: 1. Evolving patchy acute left MCA territory ischemic infarcts as
above. No significant mass effect or evidence of hemorrhagic
transformation.
2. No other new acute intracranial process.
3. Stable atrophy with chronic small vessel ischemic disease.

## 2017-04-13 ENCOUNTER — Ambulatory Visit: Payer: Medicare HMO | Admitting: Neurology

## 2017-05-01 ENCOUNTER — Other Ambulatory Visit: Payer: Self-pay | Admitting: Cardiology

## 2017-05-02 ENCOUNTER — Other Ambulatory Visit: Payer: Self-pay | Admitting: *Deleted

## 2017-05-02 MED ORDER — METOPROLOL SUCCINATE ER 25 MG PO TB24: 25.0000 mg | ORAL_TABLET | Freq: Every day | ORAL | 1 refills | Status: DC

## 2017-05-09 DIAGNOSIS — Z87898 Personal history of other specified conditions: Secondary | ICD-10-CM | POA: Insufficient documentation

## 2017-05-09 DIAGNOSIS — Z8679 Personal history of other diseases of the circulatory system: Secondary | ICD-10-CM | POA: Insufficient documentation

## 2017-05-09 DIAGNOSIS — I639 Cerebral infarction, unspecified: Secondary | ICD-10-CM | POA: Insufficient documentation

## 2017-06-06 ENCOUNTER — Ambulatory Visit: Payer: Medicare HMO | Admitting: Internal Medicine

## 2017-06-06 ENCOUNTER — Encounter: Payer: Self-pay | Admitting: Internal Medicine

## 2017-06-06 VITALS — BP 118/63 | HR 79 | Ht 66.0 in | Wt 138.2 lb

## 2017-06-06 DIAGNOSIS — I4821 Permanent atrial fibrillation: Secondary | ICD-10-CM

## 2017-06-06 DIAGNOSIS — I482 Chronic atrial fibrillation: Secondary | ICD-10-CM | POA: Diagnosis not present

## 2017-06-06 DIAGNOSIS — I428 Other cardiomyopathies: Secondary | ICD-10-CM | POA: Diagnosis not present

## 2017-06-06 DIAGNOSIS — Z8679 Personal history of other diseases of the circulatory system: Secondary | ICD-10-CM | POA: Insufficient documentation

## 2017-06-06 DIAGNOSIS — E785 Hyperlipidemia, unspecified: Secondary | ICD-10-CM | POA: Diagnosis not present

## 2017-06-06 NOTE — Progress Notes (Signed)
Grossly normal   OFFICE NOTE  Chief Complaint:  No complaints  Primary Care Physician: Algis Greenhouse, MD  HPI:  Phillip Solis is a 72 year old gentleman who I first saw in 2011 with new-onset atrial fibrillation in the setting of vomiting and during an endoscopy to remove food which was stuck in his throat. It resolved spontaneously and he has had no recurrence that I am aware of. He occasionally gets palpitations; however, is maintained on bisoprolol 5 mg twice a day. He also takes aspirin due to a low CHADS2 score, which is probably 0. Unfortunately he does not have a primary care provider. Today's also described increasing shortness of breath over the past several months which is worse he notes when walking long distances, especially across the parking lot at [employer]. He says that when he arrives from his car he is markedly short of breath and notes that he's having pain in his legs that usually discontinues his walk. As he stops the symptoms improved. The symptoms have been going on again for several months but increasing somewhat in frequency. Phillip Solis has a strong smoking history with one and half pack per day smoking for over 30 years but he quit a number of years ago.  He has never had an ischemic evaluation to my knowledge. His EKG today does show a interventricular conduction delay and sinus bradycardia with nonspecific T-wave changes.  I saw Phillip Solis back in the office today nearly a year and a half after his last appointment. He had a number of medical problems which I felt needed further evaluation. I had ordered a stress test, abdominal ultrasound and peripheral Dopplers, but he had none of those tests performed. Apparently he lost his insurance and stopped working. He is currently on Medicare with a [employer] supplement. He recently was in [facility] for what he thought was a sinus infection. From the best I can tell he had imaging including a chest x-ray and a head CT. He was  placed on antibiotics however he was told that he had "fluid around his heart". Laboratory work indicated an elevated BNP of 1400. There is no evidence that he was diuresed and an echocardiogram was not performed. Chest x-ray demonstrated hyperinflation with mild linear atelectasis at the lung bases. He was discharged from the emergency department.  With regard to his claudication he reports she's been walking a lot more and he reports his leg pain has improved somewhat.  Phillip Solis returns today for follow-up. He underwent screening abdominal ultrasound as well as lower extremity arterial Dopplers for leg pain. This demonstrated normal ABIs and no evidence for arterial insufficiency. His abdominal aorta was normal. He also underwent an echocardiogram because of his recent episode of heart failure and elevated BNP. This does demonstrate a cardiomyopathy with EF of 35-40%. I'm concerned about underlying coronary artery disease. His symptoms have improved.  I saw Phillip Solis back in the office today. He underwent left and right heart catheterization by myself, the results are as follows:  Hemodynamics: Central Aortic Pressure / Mean Aortic Pressure: 113/64 LV Pressure / LV End diastolic Pressure: 7  Left Ventriculography: EF: 35-40% Wall Motion: global hypokinesis  Right Heart Data:  RA - 2  RV - 22/2  PA - 20/5 (12)   PCWP - 4  TPG - 8  FCO/CI - 3.92 L/min, 2.24 L/min  TDCO/CI - 5.94 L/min, 3.39 L/min  SVR - 22 Wood units ( dynscm?5/80)  PVR - 2.04 Wood units (  dynscm?5/80)  PA Sat% - 66  AO Sat% - 97  Coronary Angiographic Data:  Left Main: Normal  Left Anterior Descending (LAD): Angiographically normal - smaller caliber, tapers around the apex.  1st diagonal (D1): Moderate-sized vessel, no stenosis.  2nd diagonal (D2): Small to moderate-sized, no stenosis.  Circumflex (LCx): Angiographically normal, follows  the AV groove and gives off a single mid-vessel OM vessel.  1st obtuse marginal: Angiographically normal  Right Coronary Artery: Dominant vessel. Larger caliber. No stenosis.  right ventricle branch of right coronary artery: normal  posterior descending artery: No stenosis  posterior lateral branch: No stenosis  Impression: 1. Angiographically normal coronary arteries 2. Severe global hypokinesis, LVEF 35-40% 3. Compensated right and left heart filling pressures 4. Moderately decreased cardiac output  He's had no problems from his catheterization. Overall he feels well. As mentioned above his right heart catheterization shows well compensated filling pressures, but he is not on diuretic. He's currently not on an ACE or ARB, and I believe he did good candidate to start on and trust oh. This would give him some benefit from the ARB as well as natruretic.  Phillip Solis returns today for follow-up. He reports he is feeling well. At last visit I started him on Entresto. He seems to be tolerating this although blood pressure remains low at 100/60 and has not allowed up titration of the medicine. Unfortunately he is not yet established a coverage from the provider so cost of the medicine is is not affordable. We'll provide him with more samples today however he does need to contact patient assistance. He's also on bisoprolol and aspirin. He denies any shortness of breath and has not had any congestive symptoms and therefore he is not currently on any diuretic other than the sacubitril.  I saw Phillip Solis back in the office today for follow-up. He reports he is breathing somewhat better. He is compliant with his medications although it's not clear yet whether or not he is been established to receive Entresto other than with her samples. Blood pressure remained stable in the low 100s. He is currently on the 24/26 mg dose of Entresto. A repeat echo recently shows an improvement in EF up to 45%. LV  filling pressures remain elevated. He still gets some shortness of breath with moderate exertion but he reports this is improved. He says that he is urinating more frequently. As mentioned above he has a remote history of PAF and given his new congestive heart failure, his CHADSVASC score is now 1 (there is no history of hypertension and he is less than age 18). He has not had any recent recurrence of atrial fibrillation therefore were maintaining him on low-dose aspirin.  11/26/2015  Phillip Solis returns today for follow-up. Unfortunately he was recently hospitalized for an acute stroke. He had a history of remote PAF without any recent recurrence. We were treating him for his cardiomyopathy and actually improved with his ejection fraction on Entresto. This medicine, however was stopped, possibly due to hypotension during this recent admission. He was in A. fib with RVR. There was difficulty in getting rate control however he spontaneously converted. He is maintaining sinus rhythm since then. He is now on Eliquis and has followed up with Dr. Curt Bears for possible antiarrhythmic medication although since he is in sinus rhythm I do not see ongoing need for EP follow-up. With regards to his cardiomyopathy, his recent echo actually shows EF is up to 45-50%. He's currently on Toprol-XL. He has regained  a lot of his strength and feels fairly well with occasional speech problems.  11/15/2016  Phillip Solis was seen today follow-up. Unfortunately recently was admitted for headaches. He was found to have bilateral frontal subdural hematomas. He denies any falls or head trauma was thought that this is likely spontaneous. He was on warfarin. INR was therapeutic at 2.46 however he was given vitamin K to reverse his INR. He was also given steroids due to concern for increased intracranial pressure. He was seen by neurosurgery felt that these would resolve spontaneously. He reports recurrent headache, particularly worse when  sneezing or any Valsalva maneuvers. It was felt that because of his elevated risk of stroke and CHADSVASC score of 6, he would ultimately benefit from going on a novel oral anticoagulant. He is not on aspirin.  03/09/2017  Phillip Solis returns today for follow-up.  Unfortunately he has had a recurrent idiopathic subdural hematoma develop after restarting his Eliquis in July.  He presented with frequent headaches and was found to have hematomas on his CT scan and underwent neurosurgery with Dr. Arnoldo Morale.  This since consisted of drainage and due to associated seizure activity he was placed on Keppra.  He reports he has made a significant improvement in recent office notes from Dr. Arnoldo Morale indicates that he is doing well after his subdural hematoma evacuation.  He is not on any anticoagulation.  His PCP recently restarted his atorvastatin and he is on metoprolol.  He remains in long-standing persistent if not permanent atrial fibrillation with a chads vas score of 6.  Heart rate today is 104.  He is asymptomatic with his A. fib.  He says he generally feels well.  We had a long discussion about his stroke risk today.  I am concerned about 2 prior subdural hematomas including one that was apparently spontaneous.  This was in the setting of anticoagulation both with warfarin and Eliquis.  At this point, I do not consider him a long-term anticoagulation candidate due to recurrent subdural hematomas.  We discussed options for him including maintaining him on low-dose aspirin which would be inferior given his high chads vas score or consideration for a watchman left atrial appendage occluder device.  I explained this device at some length today and feel that he may be a good candidate for this.  Unfortunately we are not currently performing this service in [place] however are referring patients to [facility] in [place].  06/06/2017  Phillip Solis was seen today in follow-up. He has no complaints. He denies shortness  of breath, chest pain, dizziness, TIA or stroke symptoms. He says he is doing great!  As mentioned above, we discussed his ongoing stroke risk with regards to A. fib which is long-standing persistent if not permanent.  His CHADSVASC score is 6, but he did have subdural hematoma.  He does not want to be anticoagulated.  He is not interested in the Paxton device.  We will remain on aspirin.  He is rate controlled with regards to A. fib.  He is on atorvastatin.  He will need a repeat lipid profile in 6 months.  PMHx:  Past Medical History:  Diagnosis Date  . Atrial fibrillation with rapid ventricular response ([place]) 07/14/2015  . History of atrial fibrillation    in setting of endoscopy in 2011; resolved  . History of palpitations   . Stroke [facility])    2017    Past Surgical History:  Procedure Laterality Date  . BURR HOLE Right 12/23/2016  Procedure: BURR HOLES FOR EVACUATION OF SUBDURAL HEMATOMA;  Surgeon: Newman Pies, MD;  Location: [place];  Service: Neurosurgery;  Laterality: Right;  right  . LEFT AND RIGHT HEART CATHETERIZATION WITH CORONARY ANGIOGRAM N/A 07/23/2014   Procedure: LEFT AND RIGHT HEART CATHETERIZATION WITH CORONARY ANGIOGRAM;  Surgeon: [name], MD;  Location: [facility] CATH LAB;  Service: Cardiovascular;  Laterality: N/A;  . RADIOLOGY WITH ANESTHESIA N/A 07/11/2015   Procedure: RADIOLOGY WITH ANESTHESIA;  Surgeon: [name], MD;  Location: [place];  Service: Radiology;  Laterality: N/A;    FAMHx:  Family History  Problem Relation Age of Onset  . Heart Problems Mother   . Cancer Father   . Stroke Father     SOCHx:   reports that he quit smoking about 44 years ago. His smokeless tobacco use includes chew. He reports that he drinks about 0.6 oz of alcohol per week. He reports that he does not use drugs.  ALLERGIES:  Allergies  Allergen Reactions  . No Known Allergies     ROS: Pertinent items noted in HPI and remainder of comprehensive ROS otherwise  negative.  HOME MEDS: Current Outpatient Medications  Medication Sig Dispense Refill  . aspirin EC 81 MG tablet Take 81 mg by mouth daily.    Marland Kitchen atorvastatin (LIPITOR) 20 MG tablet TAKE 1 TABLET BY MOUTH ONCE DAILY AT 6PM 90 tablet 2  . metoprolol succinate (TOPROL-XL) 25 MG 24 hr tablet Take 1 tablet (25 mg total) by mouth daily. 90 tablet 1  . Multiple Vitamin (MULTIVITAMIN WITH MINERALS) TABS tablet Take 1 tablet by mouth daily.     No current facility-administered medications for this visit.     LABS/IMAGING: No results found for this or any previous visit (from the past 48 hour(s)). No results found.  VITALS: BP 118/63   Pulse 79   Ht 5\' 6"  (1.676 m)   Wt 138 lb 3.2 oz (62.7 kg)   BMI 22.31 kg/m   EXAM: General appearance: alert and no distress Neck: no carotid bruit and no JVD Lungs: clear to auscultation bilaterally Heart: regular rate and rhythm, S1, S2 normal, no murmur, click, rub or gallop Abdomen: soft, non-tender; bowel sounds normal; no masses,  no organomegaly Extremities: extremities normal, atraumatic, no cyanosis or edema Pulses: 2+ and symmetric Skin: Skin color, texture, turgor normal. No rashes or lesions Neurologic: GroPsych: Pleasanty normal Psych: Pleasant   EKG: Sinus rhythm with PVCs at 79, incomplete left bundle branch block, nonspecific T wave changes-personally reviewed  ASSESSMENT: 1. Nonischemic cardiomyopathy- (now improved to 45-50%), normal coronaries by recent catheterization 2. Leg pain - normal ABI's (no clear PAD) 3. Paroxysmal atrial fibrillation on aspirin (CHADSVASC score of 6) 4. Extensive former tobacco smoker 5. History of stroke 6. 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 Dyslipidemia  PLAN: 1.   Phillip Solis is not interested in the watchman device.  He prefers to stay off of anticoagulation due to subdural hemorrhage.  He is maintained on aspirin and metoprolol.  He takes atorvastatin for dyslipidemia.  Plan  follow-up in 6 months we will likely check a repeat lipid profile at that time.  [name], MD, [facility], [place] Director of the Advanced Lipid Disorders &  Cardiovascular Risk Reduction Clinic Diplomate of the American Board of Clinical Lipidology Attending Cardiologist  Direct Dial: [phone]  Fax: [phone]  Website:  [email or web address] 06/06/2017, 8:40 AM

## 2017-06-06 NOTE — Patient Instructions (Signed)
Your physician wants you to follow-up in: 6 months with Dr. Hilty. You will receive a reminder letter in the mail two months in advance. If you don't receive a letter, please call our office to schedule the follow-up appointment.    

## 2017-06-09 ENCOUNTER — Ambulatory Visit: Payer: Medicare HMO | Admitting: Neurology

## 2017-06-13 ENCOUNTER — Ambulatory Visit: Payer: Self-pay | Admitting: Pharmacist Clinician (PhC)/ Clinical Pharmacy Specialist

## 2017-06-13 DIAGNOSIS — I4891 Unspecified atrial fibrillation: Secondary | ICD-10-CM

## 2017-06-13 DIAGNOSIS — Z7901 Long term (current) use of anticoagulants: Principal | ICD-10-CM

## 2017-06-13 DIAGNOSIS — Z5181 Encounter for therapeutic drug level monitoring: Secondary | ICD-10-CM

## 2017-08-16 ENCOUNTER — Other Ambulatory Visit: Payer: Self-pay | Admitting: *Deleted

## 2017-08-16 MED ORDER — ATORVASTATIN CALCIUM 20 MG PO TABS: 20.0000 mg | ORAL_TABLET | Freq: Every day | ORAL | 2 refills | Status: AC

## 2017-08-16 MED ORDER — METOPROLOL SUCCINATE ER 25 MG PO TB24: 25.0000 mg | ORAL_TABLET | Freq: Every day | ORAL | 2 refills | Status: AC

## 2018-05-08 DIAGNOSIS — R918 Other nonspecific abnormal finding of lung field: Secondary | ICD-10-CM | POA: Diagnosis not present

## 2018-05-08 DIAGNOSIS — N2889 Other specified disorders of kidney and ureter: Secondary | ICD-10-CM | POA: Diagnosis not present

## 2018-05-08 DIAGNOSIS — J90 Pleural effusion, not elsewhere classified: Secondary | ICD-10-CM | POA: Diagnosis not present

## 2018-05-08 DIAGNOSIS — I4891 Unspecified atrial fibrillation: Secondary | ICD-10-CM

## 2018-05-09 DIAGNOSIS — I4891 Unspecified atrial fibrillation: Secondary | ICD-10-CM | POA: Diagnosis not present

## 2018-05-09 DIAGNOSIS — J90 Pleural effusion, not elsewhere classified: Secondary | ICD-10-CM | POA: Diagnosis not present

## 2018-05-09 DIAGNOSIS — N2889 Other specified disorders of kidney and ureter: Secondary | ICD-10-CM | POA: Diagnosis not present

## 2018-05-09 DIAGNOSIS — I340 Nonrheumatic mitral (valve) insufficiency: Secondary | ICD-10-CM | POA: Diagnosis not present

## 2018-05-09 DIAGNOSIS — R918 Other nonspecific abnormal finding of lung field: Secondary | ICD-10-CM | POA: Diagnosis not present

## 2018-05-09 DIAGNOSIS — I361 Nonrheumatic tricuspid (valve) insufficiency: Secondary | ICD-10-CM | POA: Diagnosis not present

## 2018-05-10 DIAGNOSIS — N2889 Other specified disorders of kidney and ureter: Secondary | ICD-10-CM | POA: Diagnosis not present

## 2018-05-10 DIAGNOSIS — J90 Pleural effusion, not elsewhere classified: Secondary | ICD-10-CM | POA: Diagnosis not present

## 2018-05-10 DIAGNOSIS — R918 Other nonspecific abnormal finding of lung field: Secondary | ICD-10-CM | POA: Diagnosis not present

## 2018-05-10 DIAGNOSIS — I4891 Unspecified atrial fibrillation: Secondary | ICD-10-CM | POA: Diagnosis not present

## 2018-05-10 DIAGNOSIS — C342 Malignant neoplasm of middle lobe, bronchus or lung: Secondary | ICD-10-CM

## 2018-05-11 DIAGNOSIS — N509 Disorder of male genital organs, unspecified: Secondary | ICD-10-CM | POA: Diagnosis not present

## 2018-05-11 DIAGNOSIS — I4891 Unspecified atrial fibrillation: Secondary | ICD-10-CM | POA: Diagnosis not present

## 2018-05-11 DIAGNOSIS — N289 Disorder of kidney and ureter, unspecified: Secondary | ICD-10-CM | POA: Diagnosis not present

## 2018-05-11 DIAGNOSIS — R918 Other nonspecific abnormal finding of lung field: Secondary | ICD-10-CM

## 2018-05-11 DIAGNOSIS — E43 Unspecified severe protein-calorie malnutrition: Secondary | ICD-10-CM

## 2018-05-11 DIAGNOSIS — D649 Anemia, unspecified: Secondary | ICD-10-CM

## 2018-05-11 DIAGNOSIS — N2889 Other specified disorders of kidney and ureter: Secondary | ICD-10-CM | POA: Diagnosis not present

## 2018-05-11 DIAGNOSIS — J90 Pleural effusion, not elsewhere classified: Secondary | ICD-10-CM

## 2018-05-11 DIAGNOSIS — D696 Thrombocytopenia, unspecified: Secondary | ICD-10-CM

## 2018-05-12 DIAGNOSIS — I4891 Unspecified atrial fibrillation: Secondary | ICD-10-CM | POA: Diagnosis not present

## 2018-05-12 DIAGNOSIS — N2889 Other specified disorders of kidney and ureter: Secondary | ICD-10-CM | POA: Diagnosis not present

## 2018-05-12 DIAGNOSIS — J90 Pleural effusion, not elsewhere classified: Secondary | ICD-10-CM | POA: Diagnosis not present

## 2018-05-12 DIAGNOSIS — R918 Other nonspecific abnormal finding of lung field: Secondary | ICD-10-CM | POA: Diagnosis not present

## 2018-05-15 ENCOUNTER — Telehealth: Payer: Self-pay | Admitting: Internal Medicine

## 2018-05-15 MED ORDER — FUROSEMIDE 20 MG PO TABS: 20.0000 mg | ORAL_TABLET | Freq: Every day | ORAL | 5 refills | Status: AC

## 2018-05-15 NOTE — Telephone Encounter (Signed)
Thanks for notifying me - ok to prescribe lasix under my name. Will need follow-up with [place].  Dr. Lemmie Evens

## 2018-05-15 NOTE — Telephone Encounter (Signed)
Patient aware MD has OK'ed Rx. Rx(s) sent to pharmacy electronically.

## 2018-05-15 NOTE — Telephone Encounter (Addendum)
Patient of Dr. Debara Pickett walked in to office today to schedule post hospital visit. Was at [facility] for AF, pleural effusion, sepsis, mass on right lung, renal mass, scrotal mass. He had questions about his medications. He was started on diltiazem 180mg  QD - explained this is cardiac medication for his heart rate/AF. He was supposed to start lasix 20mg  QD per paperwork but he has not picked this up. Advised he needs to call [facility] to have this filled. He is still on lipitor 20mg  QD and metoprolol succinate 25mg  QD. He was scheduled for f/up with [facility] on 1/13. Advised that he bring discharge paperwork to this appointment. Advised patient follow up with PCP for masses (as noted on discharge summary)  Addendum: while in office, patient called [facility] who reported they did NOT have Rx for lasix 20mg  for patient per discharge summary from [facility] despite his paperwork showing #[phone] with 2 refills. They are requesting Dr. Debara Pickett OK this prescription. Patient aware this request will be have to sent to him since no one from our office was the original prescriber.

## 2018-05-15 NOTE — Addendum Note (Signed)
Addended by: Fidel Levy on: 05/15/2018 12:07 PM   Modules accepted: Orders

## 2018-05-18 ENCOUNTER — Telehealth: Payer: Self-pay | Admitting: Cardiology

## 2018-05-18 DIAGNOSIS — I340 Nonrheumatic mitral (valve) insufficiency: Secondary | ICD-10-CM

## 2018-05-18 DIAGNOSIS — J90 Pleural effusion, not elsewhere classified: Secondary | ICD-10-CM

## 2018-05-18 DIAGNOSIS — I5022 Chronic systolic (congestive) heart failure: Secondary | ICD-10-CM | POA: Diagnosis not present

## 2018-05-18 DIAGNOSIS — R918 Other nonspecific abnormal finding of lung field: Secondary | ICD-10-CM

## 2018-05-18 DIAGNOSIS — I513 Intracardiac thrombosis, not elsewhere classified: Secondary | ICD-10-CM

## 2018-05-18 DIAGNOSIS — J9601 Acute respiratory failure with hypoxia: Secondary | ICD-10-CM | POA: Diagnosis not present

## 2018-05-18 DIAGNOSIS — I4891 Unspecified atrial fibrillation: Secondary | ICD-10-CM

## 2018-05-18 DIAGNOSIS — I2699 Other pulmonary embolism without acute cor pulmonale: Secondary | ICD-10-CM

## 2018-05-18 NOTE — Telephone Encounter (Signed)
Received Note from [facility] on 05/18/18, Appt 05/22/18 @ 11:00AM.[place]

## 2018-05-19 ENCOUNTER — Telehealth: Payer: Self-pay | Admitting: Cardiology

## 2018-05-19 DIAGNOSIS — R918 Other nonspecific abnormal finding of lung field: Secondary | ICD-10-CM

## 2018-05-19 DIAGNOSIS — D649 Anemia, unspecified: Secondary | ICD-10-CM

## 2018-05-19 DIAGNOSIS — E441 Mild protein-calorie malnutrition: Secondary | ICD-10-CM

## 2018-05-19 DIAGNOSIS — D696 Thrombocytopenia, unspecified: Secondary | ICD-10-CM

## 2018-05-19 DIAGNOSIS — J9601 Acute respiratory failure with hypoxia: Secondary | ICD-10-CM

## 2018-05-19 DIAGNOSIS — J90 Pleural effusion, not elsewhere classified: Secondary | ICD-10-CM

## 2018-05-19 DIAGNOSIS — I2699 Other pulmonary embolism without acute cor pulmonale: Secondary | ICD-10-CM | POA: Diagnosis not present

## 2018-05-19 DIAGNOSIS — E871 Hypo-osmolality and hyponatremia: Secondary | ICD-10-CM

## 2018-05-19 DIAGNOSIS — N509 Disorder of male genital organs, unspecified: Secondary | ICD-10-CM | POA: Diagnosis not present

## 2018-05-19 DIAGNOSIS — I4891 Unspecified atrial fibrillation: Secondary | ICD-10-CM

## 2018-05-19 DIAGNOSIS — N289 Disorder of kidney and ureter, unspecified: Secondary | ICD-10-CM | POA: Diagnosis not present

## 2018-05-19 DIAGNOSIS — I5022 Chronic systolic (congestive) heart failure: Secondary | ICD-10-CM | POA: Diagnosis not present

## 2018-05-19 DIAGNOSIS — I236 Thrombosis of atrium, auricular appendage, and ventricle as current complications following acute myocardial infarction: Secondary | ICD-10-CM

## 2018-05-22 ENCOUNTER — Ambulatory Visit: Payer: Medicare HMO | Admitting: Cardiology

## 2018-06-10 NOTE — Telephone Encounter (Signed)
Received Consultation Notes from [facility] on June 11, 2018, Appt 05/22/18 @ 11:00AM.NV

## 2018-06-10 DEATH — deceased
